# Patient Record
Sex: Female | Born: 1960 | Race: Black or African American | Hispanic: No | Marital: Married | Smoking: Never smoker
Health system: Southern US, Community
[De-identification: ages and names within clinical notes are randomized; demographics above are authoritative.]

## PROBLEM LIST (undated history)

## (undated) DIAGNOSIS — E049 Nontoxic goiter, unspecified: Secondary | ICD-10-CM

## (undated) DIAGNOSIS — E0590 Thyrotoxicosis, unspecified without thyrotoxic crisis or storm: Secondary | ICD-10-CM

## (undated) DIAGNOSIS — K2100 Gastro-esophageal reflux disease with esophagitis, without bleeding: Secondary | ICD-10-CM

## (undated) DIAGNOSIS — K589 Irritable bowel syndrome without diarrhea: Secondary | ICD-10-CM

## (undated) DIAGNOSIS — K599 Functional intestinal disorder, unspecified: Secondary | ICD-10-CM

## (undated) DIAGNOSIS — E041 Nontoxic single thyroid nodule: Secondary | ICD-10-CM

## (undated) DIAGNOSIS — Z8619 Personal history of other infectious and parasitic diseases: Secondary | ICD-10-CM

## (undated) DIAGNOSIS — L819 Disorder of pigmentation, unspecified: Secondary | ICD-10-CM

## (undated) DIAGNOSIS — Z803 Family history of malignant neoplasm of breast: Secondary | ICD-10-CM

## (undated) DIAGNOSIS — D573 Sickle-cell trait: Secondary | ICD-10-CM

## (undated) DIAGNOSIS — E785 Hyperlipidemia, unspecified: Secondary | ICD-10-CM

## (undated) DIAGNOSIS — Z860100 Personal history of colon polyps, unspecified: Secondary | ICD-10-CM

## (undated) DIAGNOSIS — I10 Essential (primary) hypertension: Secondary | ICD-10-CM

## (undated) HISTORY — DX: Personal history of colonic polyps: Z86.010

## (undated) HISTORY — DX: Gastro-esophageal reflux disease with esophagitis, without bleeding: K21.00

## (undated) HISTORY — DX: Functional intestinal disorder, unspecified: K59.9

## (undated) HISTORY — DX: Gastro-esophageal reflux disease with esophagitis: K21.0

## (undated) HISTORY — DX: Irritable bowel syndrome, unspecified: K58.9

## (undated) HISTORY — DX: Essential (primary) hypertension: I10

## (undated) HISTORY — DX: Family history of malignant neoplasm of breast: Z80.3

## (undated) HISTORY — DX: Personal history of other infectious and parasitic diseases: Z86.19

## (undated) HISTORY — DX: Personal history of colon polyps, unspecified: Z86.0100

## (undated) HISTORY — DX: Nontoxic goiter, unspecified: E04.9

## (undated) HISTORY — DX: Sickle-cell trait: D57.3

## (undated) HISTORY — DX: Hyperlipidemia, unspecified: E78.5

## (undated) HISTORY — DX: Thyrotoxicosis, unspecified without thyrotoxic crisis or storm: E05.90

## (undated) HISTORY — DX: Disorder of pigmentation, unspecified: L81.9

## (undated) HISTORY — DX: Nontoxic single thyroid nodule: E04.1

## (undated) HISTORY — PX: ESOPHAGOGASTRODUODENOSCOPY: SHX1529

---

## 2002-03-26 DIAGNOSIS — L819 Disorder of pigmentation, unspecified: Secondary | ICD-10-CM

## 2002-03-26 HISTORY — DX: Disorder of pigmentation, unspecified: L81.9

## 2004-10-11 ENCOUNTER — Ambulatory Visit: Payer: Self-pay | Admitting: Family Medicine

## 2005-09-04 ENCOUNTER — Emergency Department (HOSPITAL_COMMUNITY): Admission: EM | Admit: 2005-09-04 | Discharge: 2005-09-04 | Payer: Self-pay | Admitting: Family Medicine

## 2005-10-11 ENCOUNTER — Ambulatory Visit: Payer: Self-pay | Admitting: Family Medicine

## 2006-11-13 ENCOUNTER — Emergency Department (HOSPITAL_COMMUNITY): Admission: EM | Admit: 2006-11-13 | Discharge: 2006-11-13 | Payer: Self-pay | Admitting: Family Medicine

## 2006-11-21 ENCOUNTER — Ambulatory Visit: Payer: Self-pay | Admitting: Family Medicine

## 2007-04-03 ENCOUNTER — Ambulatory Visit: Payer: Self-pay | Admitting: Gastroenterology

## 2007-12-30 ENCOUNTER — Ambulatory Visit: Payer: Self-pay | Admitting: Family Medicine

## 2007-12-31 ENCOUNTER — Emergency Department (HOSPITAL_COMMUNITY): Admission: EM | Admit: 2007-12-31 | Discharge: 2008-01-01 | Payer: Self-pay | Admitting: Emergency Medicine

## 2008-01-28 ENCOUNTER — Emergency Department (HOSPITAL_COMMUNITY): Admission: EM | Admit: 2008-01-28 | Discharge: 2008-01-28 | Payer: Self-pay | Admitting: Family Medicine

## 2008-06-19 ENCOUNTER — Emergency Department (HOSPITAL_COMMUNITY): Admission: EM | Admit: 2008-06-19 | Discharge: 2008-06-19 | Payer: Self-pay | Admitting: Family Medicine

## 2008-11-17 ENCOUNTER — Emergency Department: Payer: Self-pay | Admitting: Unknown Physician Specialty

## 2008-11-24 HISTORY — PX: OTHER SURGICAL HISTORY: SHX169

## 2008-12-01 ENCOUNTER — Ambulatory Visit: Payer: Self-pay | Admitting: Physician Assistant

## 2008-12-30 ENCOUNTER — Ambulatory Visit: Payer: Self-pay | Admitting: Family Medicine

## 2010-01-10 ENCOUNTER — Ambulatory Visit: Payer: Self-pay | Admitting: Family Medicine

## 2010-12-29 ENCOUNTER — Ambulatory Visit: Payer: Self-pay | Admitting: Gastroenterology

## 2011-01-31 ENCOUNTER — Ambulatory Visit: Payer: Self-pay | Admitting: Family Medicine

## 2011-05-16 ENCOUNTER — Ambulatory Visit (INDEPENDENT_AMBULATORY_CARE_PROVIDER_SITE_OTHER): Payer: Federal, State, Local not specified - PPO | Admitting: Family Medicine

## 2011-05-16 ENCOUNTER — Encounter: Payer: Self-pay | Admitting: Family Medicine

## 2011-05-16 VITALS — BP 158/100 | HR 84 | Temp 98.8°F | Ht 67.0 in | Wt 162.2 lb

## 2011-05-16 DIAGNOSIS — R198 Other specified symptoms and signs involving the digestive system and abdomen: Secondary | ICD-10-CM

## 2011-05-16 DIAGNOSIS — H6190 Disorder of external ear, unspecified, unspecified ear: Secondary | ICD-10-CM | POA: Insufficient documentation

## 2011-05-16 DIAGNOSIS — K635 Polyp of colon: Secondary | ICD-10-CM

## 2011-05-16 DIAGNOSIS — K599 Functional intestinal disorder, unspecified: Secondary | ICD-10-CM

## 2011-05-16 DIAGNOSIS — K5989 Other specified functional intestinal disorders: Secondary | ICD-10-CM

## 2011-05-16 DIAGNOSIS — E049 Nontoxic goiter, unspecified: Secondary | ICD-10-CM

## 2011-05-16 DIAGNOSIS — D126 Benign neoplasm of colon, unspecified: Secondary | ICD-10-CM

## 2011-05-16 DIAGNOSIS — I10 Essential (primary) hypertension: Secondary | ICD-10-CM

## 2011-05-16 DIAGNOSIS — Z86010 Personal history of colonic polyps: Secondary | ICD-10-CM | POA: Insufficient documentation

## 2011-05-16 LAB — POC HEMOCCULT BLD/STL (OFFICE/1-CARD/DIAGNOSTIC): Fecal Occult Blood, POC: NEGATIVE

## 2011-05-16 NOTE — Assessment & Plan Note (Addendum)
Associated with fleeting sharp pains. Unsure etiology.  Exam normal today.  Hemoccult negative. ?proctalgia fugax.  Consider warm water vs topical nitro. See below.

## 2011-05-16 NOTE — Assessment & Plan Note (Signed)
Will request records from Dr. Bluford Kaufmann and review records from prior PCP, likely due for rpt colonoscopy as last 2008 and +fmhx (told rpt q51yrs).

## 2011-05-16 NOTE — Assessment & Plan Note (Signed)
Elevated today but did not take meds yet, new doctor, stressful night at work. No changes today. rtc 1 mo for f/u. Asked to keep track of bp at store and notify me sooner if consistently >140/90

## 2011-05-16 NOTE — Assessment & Plan Note (Addendum)
Per pt has had this evaluated in past . Will start by requesting records for workup so far. Discussed concern with regular laxative/enema use.  Could be worsening rectal pressure sensation. Reassess next visit.

## 2011-05-16 NOTE — Patient Instructions (Addendum)
Look into [facility] - they have a lot of resources and can be very helpful for someone who has been through what you have been through. Keep eye on blood pressure at home.  If consistently >140/90, let us know Return in 1 month for follow up and recheck blood pressure. I will request records from Dr. Thana Ates and Dr. Bluford Kaufmann and dermatology - call us with name of dermatology office you had ear biopsy at. Good to meet you today! Call us with questions

## 2011-05-16 NOTE — Assessment & Plan Note (Signed)
Per pt has had biopsy by derm years ago, told benign. Unchanged for last 7 years. Pt will call us with name of dermatologist office and will request derm records to ensure benign lesion.

## 2011-05-16 NOTE — Progress Notes (Signed)
Subjective:    Patient ID: Kendra Jackson, female    DOB: 06-03-1960, 51 y.o.   MRN: [id number]  HPI CC: new pt establish, bottom hurting  Prior saw Dr. Thana Ates, trouble getting in (2 mo).  Rectal pressure - longstanding problem.  Feeling pulling sensation and constant pressure in bottom.  No blood in stool.  2 mo ago had sharp pain lasted <1 min on bottom.  Has not had episode since.  Born with locked bowels, as child evaluated at [facility].  Recommended surgery at age 71, never had.  Stays constipated.  Uses enema or laxative regularly to have bowel movement.  Has been using ex lax.  Has tried miralax, metamucil in past, no success.  Has had 2 colonoscopies in past with polyps, last 2008.  H/o endoscopy 3 mo ago by Dr. Bluford Kaufmann for choking sensation told normal.  Maternal grandmother passed from colon cancer.  H/o abnormal thyroid panels, never needed meds though.  Has seen endo.  LMP 03/2010.  Menopausal.  HTN - bp elevated today, did not take meds yet.  Works third shift and didn't get good night's rest.  Preventative: Last CPE was 10/2010. Well woman 10/2010. Mammogram - 12/2010 normal.  Medications and allergies reviewed and updated in chart.  Past histories reviewed and updated if relevant as below. Patient Active Problem List  Diagnoses  . HTN (hypertension)  . Colon polyp  . Enlarged thyroid   Past Medical History  Diagnosis Date  . HTN (hypertension)   . Colon polyp     last colonoscopy 2008, rpt due 3 yrs  . Enlarged thyroid     labs come back abnormal-but has never needed treatment  . Sickle cell trait    No past surgical history on file. History  Substance Use Topics  . Smoking status: Never Smoker   . Smokeless tobacco: Never Used  . Alcohol Use: No   Family History  Problem Relation Age of Onset  . Heart disease Mother   . Heart disease Father     pig valve  . Coronary artery disease Father   . Cancer Father     prostate  . Cancer Sister     breast   . Diabetes Sister   . Mental illness Sister     schizophrenia  . Cancer Brother     appendix tumor, prostate  . Diabetes Brother   . Mental illness Brother     bipolar  . Cancer Maternal Aunt     bone  . Cancer Maternal Uncle     bladder  . Cancer Maternal Grandmother     colon  . Stroke Maternal Grandfather   . Coronary artery disease Paternal Grandfather   . Cancer Cousin     ovarian, bone   Allergies  Allergen Reactions  . Neosporin (Triple Antibiotic) Rash   No current outpatient prescriptions on file prior to visit.    Review of Systems  Constitutional: Negative for fever, chills, activity change, appetite change, fatigue and unexpected weight change.  HENT: Negative for hearing loss and neck pain.   Eyes: Negative for visual disturbance.  Respiratory: Negative for cough, chest tightness, shortness of breath and wheezing.   Cardiovascular: Negative for chest pain, palpitations and leg swelling.  Gastrointestinal: Positive for constipation. Negative for nausea, vomiting, abdominal pain, diarrhea, blood in stool and abdominal distention.  Genitourinary: Negative for hematuria and difficulty urinating.  Musculoskeletal: Negative for myalgias and arthralgias.  Skin: Negative for rash.  Neurological: Negative for dizziness,  seizures, syncope and headaches.  Hematological: Does not bruise/bleed easily.  Psychiatric/Behavioral: Negative for dysphoric mood. The patient is not nervous/anxious.        Objective:   Physical Exam  Nursing note and vitals reviewed. Constitutional: She is oriented to person, place, and time. She appears well-developed and well-nourished. No distress.  HENT:  Head: Normocephalic and atraumatic.  Right Ear: External ear normal.  Left Ear: External ear normal.  Nose: Nose normal.  Mouth/Throat: Oropharynx is clear and moist. No oropharyngeal exudate.  Eyes: Conjunctivae and EOM are normal. Pupils are equal, round, and reactive to light. No  scleral icterus.  Neck: Normal range of motion. Neck supple. No thyromegaly present.  Cardiovascular: Normal rate, regular rhythm, normal heart sounds and intact distal pulses.   No murmur heard. Pulses:      Radial pulses are 2+ on the right side, and 2+ on the left side.  Pulmonary/Chest: Effort normal and breath sounds normal. No respiratory distress. She has no wheezes. She has no rales.  Abdominal: Soft. Bowel sounds are normal. She exhibits no distension and no mass. There is no tenderness. There is no rebound and no guarding.  Genitourinary: Rectum normal. Rectal exam shows no external hemorrhoid, no internal hemorrhoid, no fissure, no mass, no tenderness and anal tone normal. Guaiac negative stool.  Musculoskeletal: Normal range of motion. She exhibits no edema.  Lymphadenopathy:    She has no cervical adenopathy.  Neurological: She is alert and oriented to person, place, and time.       CN grossly intact, station and gait intact  Skin: Skin is warm and dry. No rash noted.       Dark discoloration left pinna - per pt stable and has had eval by dermatology  Psychiatric: She has a normal mood and affect. Her behavior is normal. Judgment and thought content normal.       Assessment & Plan:

## 2011-05-27 ENCOUNTER — Encounter: Payer: Self-pay | Admitting: Family Medicine

## 2011-05-29 ENCOUNTER — Encounter: Payer: Self-pay | Admitting: Family Medicine

## 2011-05-30 ENCOUNTER — Institutional Professional Consult (permissible substitution): Payer: Federal, State, Local not specified - PPO | Admitting: Family Medicine

## 2011-06-15 ENCOUNTER — Ambulatory Visit (INDEPENDENT_AMBULATORY_CARE_PROVIDER_SITE_OTHER): Payer: Federal, State, Local not specified - PPO | Admitting: Family Medicine

## 2011-06-15 ENCOUNTER — Encounter: Payer: Self-pay | Admitting: Family Medicine

## 2011-06-15 VITALS — BP 136/94 | HR 68 | Temp 98.8°F | Ht 67.0 in | Wt 159.8 lb

## 2011-06-15 DIAGNOSIS — D126 Benign neoplasm of colon, unspecified: Secondary | ICD-10-CM

## 2011-06-15 DIAGNOSIS — Z1211 Encounter for screening for malignant neoplasm of colon: Secondary | ICD-10-CM

## 2011-06-15 DIAGNOSIS — H6190 Disorder of external ear, unspecified, unspecified ear: Secondary | ICD-10-CM

## 2011-06-15 DIAGNOSIS — I10 Essential (primary) hypertension: Secondary | ICD-10-CM

## 2011-06-15 DIAGNOSIS — K635 Polyp of colon: Secondary | ICD-10-CM

## 2011-06-15 DIAGNOSIS — E049 Nontoxic goiter, unspecified: Secondary | ICD-10-CM

## 2011-06-15 MED ORDER — OMEPRAZOLE 40 MG PO CPDR: 40.0000 mg | DELAYED_RELEASE_CAPSULE | Freq: Every day | ORAL | Status: DC

## 2011-06-15 NOTE — Progress Notes (Signed)
Subjective:    Patient ID: Kendra Jackson, female    DOB: Feb 16, 1961, 51 y.o.   MRN: [id number]  HPI CC: 1 mo f/u HTN  Pt states last CPE was 11/2010.  HTN - No HA, vision changes, CP/tightness, SOB, leg swelling.  Compliant with atenolol bid.  States at home bp well controlled, better today than at last visit. BP Readings from Last 3 Encounters:  06/15/11 136/94  05/16/11 158/100    L ear antihelix skin lesion - bx 2004 by Dr. Jarold Motto benign SK but per pt growing again.  Will refer back to derm for re check.  Colonoscopy 03/2007 - 1 hyperplastic polyp (Dr. Diona Fanti).   Thyroid - nodule found R thyroid at screening for carotid stenosis at work.  Last TSH 0.91 (11/2010).  Pt states has had evals by endo with abnl thyroid functions but never rec pursue treatment.  Medications and allergies reviewed and updated in chart.  Past histories reviewed and updated if relevant as below. Patient Active Problem List  Diagnoses  . HTN (hypertension)  . Colon polyp  . Enlarged thyroid  . Earlobe lesion  . Rectal pressure  . Abnormal large bowel motility   Past Medical History  Diagnosis Date  . HTN (hypertension)   . History of colon polyps     hyperplastic polyp 2008, rpt 2013  . Enlarged thyroid     labs come back abnormal-but has never needed treatment  . Sickle cell trait   . 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 Abnormal pigmentation of skin 2004    L ear antihelix - bx consistent with SK Jarold Motto)  . History of herpes genitalis   . HLD (hyperlipidemia)   . IBS (irritable bowel syndrome)   . Family history of breast cancer in first degree relative   . Reflux esophagitis     PPI  . Anxiety    Past Surgical History  Procedure Date  . Tvs 11/2008    multiple uterine fibroids   History  Substance Use Topics  . Smoking status: Never Smoker   . Smokeless tobacco: Never Used  . Alcohol Use: No   Family History  Problem Relation  Age of Onset  . Heart disease Mother   . Heart disease Father     pig valve  . Coronary artery disease Father   . Cancer Father     prostate  . Cancer Sister     breast  . Diabetes Sister   . Mental illness Sister     schizophrenia  . Cancer Brother     appendix tumor, prostate  . Diabetes Brother   . Mental illness Brother     bipolar  . Cancer Maternal Aunt     bone  . Cancer Maternal Uncle     bladder  . Cancer Maternal Grandmother     colon  . Stroke Maternal Grandfather   . Coronary artery disease Paternal Grandfather   . Cancer Cousin     ovarian, bone   Allergies  Allergen Reactions  . Neosporin (Triple Antibiotic) Rash   Current Outpatient Prescriptions on File Prior to Visit  Medication Sig Dispense Refill  . aspirin 81 MG tablet Take 81 mg by mouth daily.      Marland Kitchen atenolol (TENORMIN) 50 MG tablet Take 50 mg by mouth 2 (two) times daily.      Marland Kitchen LORazepam (ATIVAN) 1 MG tablet Take 1 mg by mouth at bedtime as  needed.      . Multiple Vitamin (MULTIVITAMIN) tablet Take 1 tablet by mouth daily.      Marland Kitchen omeprazole (PRILOSEC) 40 MG capsule Take 40 mg by mouth daily.         Review of Systems Per HPI    Objective:   Physical Exam  Nursing note and vitals reviewed. Constitutional: She appears well-developed and well-nourished. No distress.  HENT:  Head: Normocephalic and atraumatic.  Mouth/Throat: Oropharynx is clear and moist. No oropharyngeal exudate.  Eyes: Conjunctivae and EOM are normal. Pupils are equal, round, and reactive to light. No scleral icterus.  Neck: Normal range of motion. Neck supple. No thyromegaly (no nodule appreciated) present.  Cardiovascular: Normal rate, regular rhythm, normal heart sounds and intact distal pulses.   No murmur heard. Pulmonary/Chest: Effort normal and breath sounds normal. No respiratory distress. She has no wheezes. She has no rales.  Abdominal: Soft. Bowel sounds are normal. She exhibits no distension. There is no  tenderness. There is no rebound and no guarding.  Musculoskeletal: She exhibits no edema.  Lymphadenopathy:    She has no cervical adenopathy.  Skin: Skin is warm and dry. No rash noted.  Psychiatric: She has a normal mood and affect.       Assessment & Plan:

## 2011-06-15 NOTE — Patient Instructions (Signed)
Pass by Kendra Jackson's office to schedule referral to GI for colonoscopy, derm, and thyroid ultrasound. No changes in blood pressure medicine. Return as needed.

## 2011-06-17 NOTE — Assessment & Plan Note (Signed)
will refer back to GI for rpt colonoscopy as pt states told to rpt q3 yrs given fmhx (maternal grandmother with colon cancer).   Discussed may be able to space out to Q16yrs.

## 2011-06-17 NOTE — Assessment & Plan Note (Signed)
Improved control today.  continue atenolol. Pt states not consistently elevated at home.

## 2011-06-17 NOTE — Assessment & Plan Note (Signed)
Lab Results  Component Value Date   TSH 0.91 12/06/2010  reviewed work screen - <1cm R nodule. Normal TSH last check.   Given abnormality found on work screening, will obtain thyroid ultrasound to further eval nodule.

## 2011-06-17 NOTE — Assessment & Plan Note (Signed)
Per pt growing. Will refer back to derm for recheck.

## 2011-06-25 DIAGNOSIS — E041 Nontoxic single thyroid nodule: Secondary | ICD-10-CM

## 2011-06-25 HISTORY — DX: Nontoxic single thyroid nodule: E04.1

## 2011-06-29 ENCOUNTER — Ambulatory Visit: Payer: Self-pay | Admitting: Family Medicine

## 2011-07-02 ENCOUNTER — Other Ambulatory Visit: Payer: Self-pay | Admitting: Family Medicine

## 2011-07-02 ENCOUNTER — Encounter: Payer: Self-pay | Admitting: Family Medicine

## 2011-07-02 DIAGNOSIS — E041 Nontoxic single thyroid nodule: Secondary | ICD-10-CM

## 2011-07-02 DIAGNOSIS — E042 Nontoxic multinodular goiter: Secondary | ICD-10-CM | POA: Insufficient documentation

## 2011-07-02 DIAGNOSIS — E049 Nontoxic goiter, unspecified: Secondary | ICD-10-CM

## 2011-07-10 ENCOUNTER — Other Ambulatory Visit (HOSPITAL_COMMUNITY)
Admission: RE | Admit: 2011-07-10 | Discharge: 2011-07-10 | Disposition: A | Discharge status: Home / Self Care | Payer: Federal, State, Local not specified - PPO | Source: Ambulatory Visit | Attending: Diagnostic Radiology | Admitting: Diagnostic Radiology

## 2011-07-10 ENCOUNTER — Ambulatory Visit
Admission: RE | Admit: 2011-07-10 | Discharge: 2011-07-10 | Disposition: A | Discharge status: Home / Self Care | Payer: Federal, State, Local not specified - PPO | Source: Ambulatory Visit | Attending: Family Medicine | Admitting: Family Medicine

## 2011-07-10 DIAGNOSIS — E049 Nontoxic goiter, unspecified: Secondary | ICD-10-CM | POA: Insufficient documentation

## 2011-07-10 DIAGNOSIS — E041 Nontoxic single thyroid nodule: Secondary | ICD-10-CM

## 2011-07-11 ENCOUNTER — Encounter: Payer: Self-pay | Admitting: Family Medicine

## 2011-07-12 ENCOUNTER — Encounter: Payer: Self-pay | Admitting: *Deleted

## 2011-07-13 ENCOUNTER — Ambulatory Visit: Payer: Federal, State, Local not specified - PPO | Admitting: Family Medicine

## 2011-07-17 ENCOUNTER — Encounter: Payer: Self-pay | Admitting: Family Medicine

## 2011-07-17 ENCOUNTER — Ambulatory Visit (INDEPENDENT_AMBULATORY_CARE_PROVIDER_SITE_OTHER): Payer: Federal, State, Local not specified - PPO | Admitting: Family Medicine

## 2011-07-17 VITALS — BP 148/98 | HR 72 | Temp 98.4°F | Wt 164.5 lb

## 2011-07-17 DIAGNOSIS — I10 Essential (primary) hypertension: Secondary | ICD-10-CM

## 2011-07-17 DIAGNOSIS — E041 Nontoxic single thyroid nodule: Secondary | ICD-10-CM

## 2011-07-17 MED ORDER — HYDROCHLOROTHIAZIDE 12.5 MG PO CAPS: 12.5000 mg | ORAL_CAPSULE | Freq: Every day | ORAL | Status: DC

## 2011-07-17 NOTE — Progress Notes (Signed)
  Subjective:    Patient ID: Kendra Jackson, female    DOB: 1960-10-08, 51 y.o.   MRN: [id number]  HPI CC: f/u htn and thyroid nodule.  HTN - takes atenolol 50mg  bid.  No cuff at home.  No vision changes, CP/tightness, SOB, leg swelling.  At home bp runs better.   BP Readings from Last 3 Encounters:  07/17/11 148/98  06/15/11 136/94  05/16/11 158/100    Thyroid nodule - reviewed biopsy results with pt - benign follicular nodule.  rec routine surveillance.  Also found microcalcifications in left lobe, will rpt [place] in 6 mo to follow this.  ?dystrophic calcification  Past Medical History  Diagnosis Date  . HTN (hypertension)   . History of colon polyps     hyperplastic polyp 2008, rpt 2013  . Enlarged thyroid     h/o abnl labs but never treated  . Sickle cell trait   . 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 Abnormal pigmentation of skin 2004    L ear antihelix - bx consistent with SK Jarold Motto)  . History of herpes genitalis   . HLD (hyperlipidemia)   . IBS (irritable bowel syndrome)   . Family history of breast cancer in first degree relative   . Reflux esophagitis     PPI  . Anxiety   . Thyroid nodule 06/2011    R nodule, FNA biopsy = benign follicular    Review of Systems Per HPI    Objective:   Physical Exam  Nursing note and vitals reviewed. Constitutional: She appears well-developed and well-nourished. No distress.  HENT:  Head: Normocephalic and atraumatic.  Mouth/Throat: Oropharynx is clear and moist. No oropharyngeal exudate.  Eyes: Conjunctivae and EOM are normal. Pupils are equal, round, and reactive to light. No scleral icterus.  Neck: Normal range of motion. Neck supple. Carotid bruit is not present. No thyromegaly (R nodule palpated) present.  Cardiovascular: Normal rate, regular rhythm and intact distal pulses.   Murmur (faint early diast murm more pronounced with inspiration) heard. Pulmonary/Chest: Effort normal  and breath sounds normal. No respiratory distress. She has no wheezes. She has no rales.  Musculoskeletal: She exhibits no edema.  Lymphadenopathy:    She has no cervical adenopathy.  Skin: Skin is warm and dry. No rash noted.       Assessment & Plan:

## 2011-07-17 NOTE — Assessment & Plan Note (Signed)
Thyroid nodule - reviewed biopsy results with pt - benign follicular nodule.  I have recommended routine surveillance.   Also found microcalcifications in left lobe per prior US - will rpt [place] in 6 mo to follow this.  ?dystrophic calcification

## 2011-07-17 NOTE — Assessment & Plan Note (Signed)
Chronic.  Not controlled. Add HCTZ at 12.5mg  daily. rtc 1-2 mo for f/u.

## 2011-07-17 NOTE — Patient Instructions (Signed)
Start hydrochlorothiazide 12.5mg  daily. Return in 1 1/2 to 2 months Good to see you today,call us with questions.

## 2011-07-25 HISTORY — PX: COLONOSCOPY: SHX174

## 2011-08-06 ENCOUNTER — Encounter: Payer: Self-pay | Admitting: Family Medicine

## 2011-08-08 ENCOUNTER — Ambulatory Visit: Payer: Self-pay | Admitting: Gastroenterology

## 2011-08-16 ENCOUNTER — Encounter: Payer: Self-pay | Admitting: Family Medicine

## 2011-08-27 ENCOUNTER — Encounter: Payer: Self-pay | Admitting: Family Medicine

## 2011-08-27 ENCOUNTER — Ambulatory Visit (INDEPENDENT_AMBULATORY_CARE_PROVIDER_SITE_OTHER): Payer: Federal, State, Local not specified - PPO | Admitting: Family Medicine

## 2011-08-27 VITALS — BP 120/78 | HR 80 | Temp 98.5°F | Wt 161.8 lb

## 2011-08-27 DIAGNOSIS — J40 Bronchitis, not specified as acute or chronic: Secondary | ICD-10-CM | POA: Insufficient documentation

## 2011-08-27 MED ORDER — AZITHROMYCIN 250 MG PO TABS: ORAL_TABLET | ORAL | Status: AC

## 2011-08-27 NOTE — Patient Instructions (Signed)
Sounds like you are developing bronchitis. Cough itself can last for a few weeks before it goes away. Use medication as prescribed:  Zpack. Push fluids and plenty of rest. Start mucinex (or guaifenesin) with plenty of fluid to mobilize mucous. Delsym for cough at night. Please return if you are not improving as expected, or if you have high fevers (>101.5) or difficulty swallowing or worsening productive cough. Call clinic with questions.  Good to see you today.

## 2011-08-27 NOTE — Assessment & Plan Note (Signed)
Given duration and deterioration after initial improvement, will cover bronchitis with zpack. Update if not improving as expected.

## 2011-08-27 NOTE — Progress Notes (Signed)
  Subjective:    Patient ID: Kendra Jackson, female    DOB: 01-30-1961, 51 y.o.   MRN: [id number]  HPI CC: "i feel like i'm getting bronchitis"  10 d h/o sxs - started with cough productive of yellow/red mucous.  Felt better with zycam, then started having sore throat.  Cough has come back and continued productive but now clear.  Some chills and sweating at work.  Some tooth pain.  Feeling some nauseated.  Some coughing fits.  Tried zycam.  Delsym has worked in past.  No fevers/chills, ST, abd pain, ear pain.  No significant headache.  No smokers at home.  No sick contacts at home.  No h/o asthma, COPD  Review of Systems Per HPI    Objective:   Physical Exam  Nursing note and vitals reviewed. Constitutional: She appears well-developed and well-nourished. No distress.  HENT:  Head: Normocephalic and atraumatic.  Right Ear: Hearing, external ear and ear canal normal.  Left Ear: Hearing, external ear and ear canal normal.  Nose: Mucosal edema present. No rhinorrhea. Right sinus exhibits no maxillary sinus tenderness and no frontal sinus tenderness. Left sinus exhibits no maxillary sinus tenderness and no frontal sinus tenderness.  Mouth/Throat: Uvula is midline, oropharynx is clear and moist and mucous membranes are normal. No oropharyngeal exudate, posterior oropharyngeal edema, posterior oropharyngeal erythema or tonsillar abscesses.       Some fluid behind TMs  Eyes: Conjunctivae and EOM are normal. Pupils are equal, round, and reactive to light. No scleral icterus.  Neck: Normal range of motion. Neck supple.  Cardiovascular: Normal rate, regular rhythm, normal heart sounds and intact distal pulses.   No murmur heard. Pulmonary/Chest: Effort normal and breath sounds normal. No respiratory distress. She has no wheezes. She has no rales.  Musculoskeletal: She exhibits no edema.  Lymphadenopathy:    She has no cervical adenopathy.  Skin: Skin is warm and dry. No rash noted.         Assessment & Plan:

## 2011-09-10 ENCOUNTER — Other Ambulatory Visit: Payer: Self-pay | Admitting: *Deleted

## 2011-09-10 MED ORDER — ATENOLOL 50 MG PO TABS: 50.0000 mg | ORAL_TABLET | Freq: Two times a day (BID) | ORAL | Status: DC

## 2011-09-14 ENCOUNTER — Ambulatory Visit: Payer: Federal, State, Local not specified - PPO | Admitting: Family Medicine

## 2011-10-05 ENCOUNTER — Other Ambulatory Visit: Payer: Self-pay | Admitting: Family Medicine

## 2011-10-05 NOTE — Telephone Encounter (Signed)
plz phone in. 

## 2011-10-05 NOTE — Telephone Encounter (Signed)
OK to refill

## 2011-10-05 NOTE — Telephone Encounter (Signed)
Rx called in as directed.   

## 2011-11-07 ENCOUNTER — Encounter: Payer: Self-pay | Admitting: Family Medicine

## 2011-11-07 ENCOUNTER — Ambulatory Visit (INDEPENDENT_AMBULATORY_CARE_PROVIDER_SITE_OTHER): Payer: Federal, State, Local not specified - PPO | Admitting: Family Medicine

## 2011-11-07 VITALS — BP 118/80 | HR 64 | Temp 98.0°F | Wt 167.8 lb

## 2011-11-07 DIAGNOSIS — M79672 Pain in left foot: Secondary | ICD-10-CM

## 2011-11-07 DIAGNOSIS — M79609 Pain in unspecified limb: Secondary | ICD-10-CM

## 2011-11-07 MED ORDER — NAPROXEN 500 MG PO TABS: ORAL_TABLET | ORAL | Status: DC

## 2011-11-07 NOTE — Progress Notes (Addendum)
  Subjective:    Patient ID: Kendra Jackson, female    DOB: 1961/03/16, 51 y.o.   MRN: [id number]  HPI CC: L foot pain  For last 1.5 wks, pain left dorsal midfoot, some dorsal as well, intermittent.  Sometimes feels like squeezing pain.  One morning this week, severe tender pain, didn't let her walk, lasted a few seconds then resolved.  Pain stays at foot.  Some tingling of toes.  No falls or injury to feet.  Wonders if gout or plantar fasciitis  Parents with h/o gout.  Pt has been eating more chicken liver recently.  Wears tennis shoes to work.  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  Does wear heels on weekends.  Review of Systems Per HPI    Objective:   Physical Exam  Nursing note and vitals reviewed. Constitutional: She appears well-developed and well-nourished. No distress.  Musculoskeletal:       R foot - WNL L foot - Tender to palpation at 3rd MT dorsal midfoot. No pain with axial loading of toes. No pain at soles of feet. No pain with palpation ankle, FROM. Normal arches. 2+ DP bilaterally       Assessment & Plan:

## 2011-11-07 NOTE — Assessment & Plan Note (Signed)
Doubt plantar fasciitis or gout. Anticipate toe extensor tendonitis, see pt instructions for plan. If not improving, consider left foot xray to r/o stress fracture.

## 2011-11-07 NOTE — Patient Instructions (Signed)
I think you have tendon inflammation on top of left foot. Ice foot, elevate foot, no heels for 2 weeks. Use anti inflammatory regularly for 5 days then as needed, take with food so as not to upset stomach. If not improving within 1-2 wks or worsening, let me know, could consider xrays. Good to see youtoday, call us with quesitons.

## 2011-11-28 ENCOUNTER — Telehealth: Payer: Self-pay | Admitting: Family Medicine

## 2011-11-28 NOTE — Telephone Encounter (Signed)
Caller: Rossi/Patient; Patient Name: Kendra Jackson; PCP: Eustaquio Boyden [facility]); Best Callback Phone Number: [phone].  Call regarding Right Flank Pain with Nausea, onset 9-3. Afebrile.  Patient has appointment on 9-5 prior to call. Afebrile.  All emergent symptoms ruled out per Flank pain Protocol, see in 24 hours due to persistent flank pain not previously evaluated.  Discussed increasing water intake, home care advice given.  Patient will come to scheduled appointment on 9-5.

## 2011-11-29 ENCOUNTER — Encounter: Payer: Self-pay | Admitting: Family Medicine

## 2011-11-29 ENCOUNTER — Ambulatory Visit (INDEPENDENT_AMBULATORY_CARE_PROVIDER_SITE_OTHER): Payer: Federal, State, Local not specified - PPO | Admitting: Family Medicine

## 2011-11-29 VITALS — BP 126/82 | HR 78 | Temp 98.1°F | Wt 164.2 lb

## 2011-11-29 DIAGNOSIS — M545 Low back pain, unspecified: Secondary | ICD-10-CM

## 2011-11-29 DIAGNOSIS — I10 Essential (primary) hypertension: Secondary | ICD-10-CM

## 2011-11-29 LAB — COMPREHENSIVE METABOLIC PANEL
BUN: 14 mg/dL (ref 6–23)
CO2: 31 mEq/L (ref 19–32)
Calcium: 10 mg/dL (ref 8.4–10.5)
Chloride: 101 mEq/L (ref 96–112)
Creatinine, Ser: 0.8 mg/dL (ref 0.4–1.2)
GFR: 96.47 mL/min (ref >60.00)
Glucose, Bld: 93 mg/dL (ref 70–99)
Total Bilirubin: 0.7 mg/dL (ref 0.3–1.2)

## 2011-11-29 LAB — POCT URINALYSIS DIPSTICK
Glucose, UA: NEGATIVE
Leukocytes, UA: NEGATIVE
Nitrite, UA: NEGATIVE
Protein, UA: NEGATIVE
Spec Grav, UA: 1.01
Urobilinogen, UA: 0.2

## 2011-11-29 LAB — LIPID PANEL
Cholesterol: 255 mg/dL — ABNORMAL HIGH (ref 0–200)
Triglycerides: 58 mg/dL (ref 0.0–149.0)

## 2011-11-29 LAB — TSH: TSH: 0.48 u[IU]/mL (ref 0.35–5.50)

## 2011-11-29 MED ORDER — CYCLOBENZAPRINE HCL 10 MG PO TABS: 10.0000 mg | ORAL_TABLET | Freq: Two times a day (BID) | ORAL | Status: AC | PRN
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
## 2011-11-29 NOTE — Patient Instructions (Signed)
Blood work today for upcoming physical. I think this is musculoskeletal - likely pulled muscle of lower back. Treat with ice/heat (whichever soothes better), over the counter aleve, and may use flexeril for muscle spasm (watch out as it can make you sleepy - may try 1/2 pll at a time). Let us know if not improving as expected.

## 2011-11-29 NOTE — Assessment & Plan Note (Signed)
MSK pain from strain. Treat with aleve, flexeril, ice/heat. Update if not improving. Pt agrees.

## 2011-12-10 ENCOUNTER — Other Ambulatory Visit: Payer: Federal, State, Local not specified - PPO

## 2011-12-17 ENCOUNTER — Other Ambulatory Visit (HOSPITAL_COMMUNITY)
Admission: RE | Admit: 2011-12-17 | Discharge: 2011-12-17 | Disposition: A | Discharge status: Home / Self Care | Payer: Federal, State, Local not specified - PPO | Source: Ambulatory Visit | Attending: Family Medicine | Admitting: Family Medicine

## 2011-12-17 ENCOUNTER — Encounter: Payer: Self-pay | Admitting: Family Medicine

## 2011-12-17 ENCOUNTER — Ambulatory Visit (INDEPENDENT_AMBULATORY_CARE_PROVIDER_SITE_OTHER): Payer: Federal, State, Local not specified - PPO | Admitting: Family Medicine

## 2011-12-17 VITALS — BP 120/78 | HR 72 | Temp 98.2°F | Ht 66.5 in | Wt 165.5 lb

## 2011-12-17 DIAGNOSIS — Z01419 Encounter for gynecological examination (general) (routine) without abnormal findings: Secondary | ICD-10-CM

## 2011-12-17 DIAGNOSIS — Z124 Encounter for screening for malignant neoplasm of cervix: Secondary | ICD-10-CM

## 2011-12-17 DIAGNOSIS — I10 Essential (primary) hypertension: Secondary | ICD-10-CM

## 2011-12-17 DIAGNOSIS — E041 Nontoxic single thyroid nodule: Secondary | ICD-10-CM

## 2011-12-17 DIAGNOSIS — Z1231 Encounter for screening mammogram for malignant neoplasm of breast: Secondary | ICD-10-CM

## 2011-12-17 DIAGNOSIS — Z23 Encounter for immunization: Secondary | ICD-10-CM

## 2011-12-17 DIAGNOSIS — Z0000 Encounter for general adult medical examination without abnormal findings: Secondary | ICD-10-CM | POA: Insufficient documentation

## 2011-12-17 DIAGNOSIS — Z8619 Personal history of other infectious and parasitic diseases: Secondary | ICD-10-CM

## 2011-12-17 DIAGNOSIS — Z1151 Encounter for screening for human papillomavirus (HPV): Secondary | ICD-10-CM | POA: Insufficient documentation

## 2011-12-17 MED ORDER — VALACYCLOVIR HCL 500 MG PO TABS: 500.0000 mg | ORAL_TABLET | Freq: Two times a day (BID) | ORAL | Status: DC

## 2011-12-17 NOTE — Addendum Note (Signed)
Addended by: Sydell Axon C on: 12/17/2011 01:42 PM   Modules accepted: Orders

## 2011-12-17 NOTE — Assessment & Plan Note (Signed)
Pending [place] for L lobe microcalcifications.  ?dystrophic calcification. Thyroid nodule biopsy - benign follicular nodule.

## 2011-12-17 NOTE — Assessment & Plan Note (Signed)
Preventative protocols reviewed and updated unless pt declined. Discussed healthy diet and lifestyle.  Set up for mammogram today. Tdap today. Pelvic/pap and breast exam today.

## 2011-12-17 NOTE — Progress Notes (Signed)
Subjective:    Patient ID: Kendra Jackson, female    DOB: 1960-05-05, 51 y.o.   MRN: [id number]  HPI CC: CPE  No concerns today.  Swears by her apple cider vinegar to control blood pressure but otherwise compliant with antihypertensive meds.  Thyroid nodule and calcified areas s/p biopsy normal (06/2011) but rec rpt [place] 12/2011, already scheduled.  H/o genital herpes, requests antiviral for this.  Feels outbreak coming on - tingling/uncomfortable sensation in vulvar area.  Caffeine: rare Lives alone, no pets. Grown son 29yo, grandson died suddenly at [employer] 2008/08/14 (drowning) - lots of stress from this. Occupation: Paramedic, works third shift Activity: no regular exercise but bought treadmill Diet: good water, fruits daily, vegetables occasionally  Preventative: Last CPE was 10/2010. Well woman 10/2010 - pap smear - remote h/o abnormal cells s/p cryotherapy.  Gets pap smears yearly. LMP - 04/12/2009, no bleeding since then. Mammogram - 12/2010 normal - would like [place] to schedule [facility]). Colonoscopy Aug 15, 2011 - hemorrhoids, rpt 5 yrs given h/o polyps ([place]). Tetanus - Tdap today. Flu shot - declines.  Medications and allergies reviewed and updated in chart.  Past histories reviewed and updated if relevant as below. Patient Active Problem List  Diagnosis  . HTN (hypertension)  . Colon polyp  . Earlobe lesion  . Rectal pressure  . Abnormal large bowel motility  . Thyroid nodule  . Bronchitis  . Left foot pain  . Pain in lower back   Past Medical History  Diagnosis Date  . HTN (hypertension)   . History of colon polyps     hyperplastic polyp 08/15/2006, 2011-08-15 hemorrhoids, rec rpt 5 yrs  . Enlarged thyroid     h/o abnl labs but never treated  . Sickle cell trait   . 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 Abnormal pigmentation of skin 2004    L ear antihelix - bx consistent with SK Jarold Motto), recheck stable per Dr. Roseanne Kaufman  . History of  herpes genitalis   . HLD (hyperlipidemia)   . IBS (irritable bowel syndrome)   . Family history of breast cancer in first degree relative   . Reflux esophagitis     PPI  . Anxiety   . Thyroid nodule 06/2011    R nodule, FNA biopsy = benign follicular  . Constipation     severe requiring laxatives, failed miralax   Past Surgical History  Procedure Date  . Tvs 11/2008    multiple uterine fibroids  . Colonoscopy 15-Aug-2011    hemorrhoids, rpt due 5 yrs given h/o polyps   History  Substance Use Topics  . Smoking status: Never Smoker   . Smokeless tobacco: Never Used  . Alcohol Use: Yes     wine occassionally   Family History  Problem Relation Age of Onset  . Heart disease Mother   . Heart disease Father     pig valve  . Coronary artery disease Father   . Cancer Father     prostate  . Cancer Sister     breast  . Diabetes Sister   . Mental illness Sister     schizophrenia  . Cancer Brother     appendix tumor, prostate  . Diabetes Brother   . Mental illness Brother     bipolar  . Cancer Maternal Aunt     bone  . Cancer Maternal Uncle     bladder  . Cancer Maternal Grandmother     colon  .  Stroke Maternal Grandfather   . Coronary artery disease Paternal Grandfather   . Cancer Cousin     ovarian, bone   Allergies  Allergen Reactions  . Ace Inhibitors   . Naprosyn (Naproxen) Other (See Comments)    abd pain, diarrhea  . Neosporin (Neomycin-Bacitracin Zn-Polymyx) Rash   Current Outpatient Prescriptions on File Prior to Visit  Medication Sig Dispense Refill  . aspirin 81 MG tablet Take 81 mg by mouth daily.      Marland Kitchen atenolol (TENORMIN) 50 MG tablet take 1 tablet by mouth every morning and then take 1 tablet by mouth every evening  60 tablet  3  . hydrochlorothiazide (MICROZIDE) 12.5 MG capsule Take 1 capsule (12.5 mg total) by mouth daily.  30 capsule  6  . LORazepam (ATIVAN) 1 MG tablet Take 1 tablet (1 mg total) by mouth at bedtime as needed for anxiety.  60 tablet  0   . Misc Natural Products (APPLE CIDER VINEGAR) TABS Take by mouth daily.      . Multiple Vitamin (MULTIVITAMIN) tablet Take 1 tablet by mouth daily.      Marland Kitchen omeprazole (PRILOSEC) 40 MG capsule Take 40 mg by mouth daily. QOD         Review of Systems  Constitutional: Negative for fever, chills, activity change, appetite change, fatigue and unexpected weight change.  HENT: Negative for hearing loss and neck pain.   Eyes: Negative for visual disturbance.  Respiratory: Negative for cough, chest tightness, shortness of breath and wheezing.   Cardiovascular: Negative for chest pain, palpitations and leg swelling.  Gastrointestinal: Negative for nausea, vomiting, abdominal pain, diarrhea, constipation, blood in stool and abdominal distention.  Genitourinary: Negative for hematuria and difficulty urinating.  Musculoskeletal: Negative for myalgias and arthralgias.  Skin: Negative for rash.  Neurological: Negative for dizziness, seizures, syncope and headaches.  Hematological: Does not bruise/bleed easily.  Psychiatric/Behavioral: Negative for dysphoric mood. The patient is not nervous/anxious.        Objective:   Physical Exam  Nursing note and vitals reviewed. Constitutional: She is oriented to person, place, and time. She appears well-developed and well-nourished. No distress.  HENT:  Head: Normocephalic and atraumatic.  Right Ear: External ear normal.  Left Ear: External ear normal.  Nose: Nose normal.  Mouth/Throat: Oropharynx is clear and moist. No oropharyngeal exudate.  Eyes: Conjunctivae normal and EOM are normal. Pupils are equal, round, and reactive to light. No scleral icterus.  Neck: Normal range of motion. Neck supple. No thyromegaly present.  Cardiovascular: Normal rate, regular rhythm, normal heart sounds and intact distal pulses.   No murmur heard. Pulses:      Radial pulses are 2+ on the right side, and 2+ on the left side.  Pulmonary/Chest: Effort normal and breath  sounds normal. No respiratory distress. She has no wheezes. She has no rales. Right breast exhibits no inverted nipple, no mass, no nipple discharge, no skin change and no tenderness. Left breast exhibits no inverted nipple, no mass, no nipple discharge, no skin change and no tenderness. Breasts are symmetrical.  Abdominal: Soft. Bowel sounds are normal. She exhibits no distension and no mass. There is no tenderness. There is no rebound and no guarding.  Genitourinary: Vagina normal and uterus normal. Pelvic exam was performed with patient supine. There is no rash, tenderness, lesion or injury on the right labia. There is no rash, tenderness, lesion or injury on the left labia. Uterus is not deviated, not enlarged, not fixed and not tender. Cervix  exhibits no motion tenderness, no discharge and no friability. Right adnexum displays no mass, no tenderness and no fullness. Left adnexum displays no mass, no tenderness and no fullness. No erythema, tenderness or bleeding around the vagina. No foreign body around the vagina. No signs of injury around the vagina. No vaginal discharge found.       Pap performed  Musculoskeletal: Normal range of motion. She exhibits no edema.  Lymphadenopathy:    She has no cervical adenopathy.    She has no axillary adenopathy.       Right axillary: No lateral adenopathy present.       Left axillary: No lateral adenopathy present.      Right: No supraclavicular adenopathy present.       Left: No supraclavicular adenopathy present.  Neurological: She is alert and oriented to person, place, and time.       CN grossly intact, station and gait intact  Skin: Skin is warm and dry. No rash noted.  Psychiatric: She has a normal mood and affect. Her behavior is normal. Judgment and thought content normal.       Assessment & Plan:

## 2011-12-17 NOTE — Assessment & Plan Note (Signed)
Sent in valtrex 500mg  bid for 3 day course prn genital herpes outbreak.

## 2011-12-17 NOTE — Addendum Note (Signed)
Addended by: Sydell Axon C on: 12/17/2011 03:05 PM   Modules accepted: Orders

## 2011-12-17 NOTE — Patient Instructions (Signed)
Good to see you today, call us with questions. Tetanus and pertussis combo shot today. We will be in touch about the thyroid ultrasound. Valtrex sent in. Pass by Marion's office to schedule mammogram at [facility].

## 2011-12-17 NOTE — Addendum Note (Signed)
Addended by: Sydell Axon C on: 12/17/2011 03:11 PM   Modules accepted: Orders

## 2011-12-17 NOTE — Assessment & Plan Note (Signed)
Chronic, stable. Continue current regimen. 

## 2011-12-20 ENCOUNTER — Encounter: Payer: Self-pay | Admitting: *Deleted

## 2011-12-25 HISTORY — PX: OTHER SURGICAL HISTORY: SHX169

## 2012-01-16 ENCOUNTER — Ambulatory Visit
Admission: RE | Admit: 2012-01-16 | Discharge: 2012-01-16 | Disposition: A | Discharge status: Home / Self Care | Payer: Federal, State, Local not specified - PPO | Source: Ambulatory Visit | Attending: Family Medicine | Admitting: Family Medicine

## 2012-01-16 DIAGNOSIS — E049 Nontoxic goiter, unspecified: Secondary | ICD-10-CM

## 2012-01-17 ENCOUNTER — Encounter: Payer: Self-pay | Admitting: Family Medicine

## 2012-02-04 ENCOUNTER — Ambulatory Visit: Payer: Self-pay | Admitting: Family Medicine

## 2012-02-04 LAB — HM MAMMOGRAPHY: HM Mammogram: NORMAL

## 2012-02-05 ENCOUNTER — Encounter: Payer: Self-pay | Admitting: *Deleted

## 2012-02-05 ENCOUNTER — Encounter: Payer: Self-pay | Admitting: Family Medicine

## 2012-02-10 ENCOUNTER — Other Ambulatory Visit: Payer: Self-pay | Admitting: Family Medicine

## 2012-02-12 ENCOUNTER — Other Ambulatory Visit: Payer: Self-pay

## 2012-02-12 NOTE — Telephone Encounter (Signed)
Pt called back and said it was OK to leave refills at [facility]. Pt will call [facility] for refills.

## 2012-02-12 NOTE — Telephone Encounter (Signed)
Pt left v/m requesting refill for Atenolol and HCTZ to [facility]. Both meds just refilled to [facility]. Left v/m for pt to call back.

## 2012-02-27 ENCOUNTER — Encounter: Payer: Self-pay | Admitting: Family Medicine

## 2012-02-27 ENCOUNTER — Ambulatory Visit (INDEPENDENT_AMBULATORY_CARE_PROVIDER_SITE_OTHER): Payer: Federal, State, Local not specified - PPO | Admitting: Family Medicine

## 2012-02-27 VITALS — BP 120/78 | HR 68 | Temp 97.9°F | Ht 66.5 in | Wt 164.5 lb

## 2012-02-27 DIAGNOSIS — J208 Acute bronchitis due to other specified organisms: Secondary | ICD-10-CM | POA: Insufficient documentation

## 2012-02-27 DIAGNOSIS — J209 Acute bronchitis, unspecified: Secondary | ICD-10-CM

## 2012-02-27 MED ORDER — GUAIFENESIN-CODEINE 100-10 MG/5ML PO SYRP: 5.0000 mL | ORAL_SOLUTION | Freq: Every evening | ORAL | Status: DC | PRN

## 2012-02-27 NOTE — Patient Instructions (Signed)
I do think you have return of upper respiratory infection, possibly bronchitis, but I think this is more of a viral process that antibiotics won't help currently. Treat with conitnued robitusin or immediate release guaifenesin to help mobilize mucous out. Lots of fluid and lots of rest. Cough syrup prescription provided today in case needed. If not improving as expected into next week, or any worsening fever >101, productive cough please call me with update.

## 2012-02-27 NOTE — Progress Notes (Signed)
  Subjective:    Patient ID: Kendra Jackson, female    DOB: [birth year], 51 y.o.   MRN: [id number]  HPI CC: bronchitis? Cold?  Cold sxs 02/07/2012.  Those resolved well.  Then over last several days having worsening cough productive of clear mucous.  + coughing fits that have led to post tussive gagging.  + chest congestion.  Taking tussin cough medicine which did thin mucous out.    No fevers/chills, abd pain, nausea, ear or tooth pain, ST.  No PNDrainage.  No HA.  No h/o asthma but has used inhaler in past when with bronchitis No smokers at home.   + around sick niece recently.  Wt Readings from Last 3 Encounters:  02/27/12 164 lb 8 oz (74.617 kg)  12/17/11 165 lb 8 oz (75.07 kg)  11/29/11 164 lb 4 oz (74.503 kg)   Past Medical History  Diagnosis Date  . HTN (hypertension)   . History of colon polyps     hyperplastic polyp 2008, 2013 hemorrhoids, rec rpt 5 yrs  . Enlarged thyroid     h/o abnl labs but never treated  . Sickle cell trait   . 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 Abnormal pigmentation of skin 2004    L ear antihelix - bx consistent with SK Jarold Motto), recheck stable per Dr. Roseanne Kaufman  . History of herpes genitalis     valtrex prn  . HLD (hyperlipidemia)   . IBS (irritable bowel syndrome)   . Family history of breast cancer in first degree relative   . Reflux esophagitis     PPI  . Anxiety   . Thyroid nodule 06/2011    R nodule, FNA biopsy = benign follicular  . Constipation     severe requiring laxatives, failed miralax   Review of Systems Per HPI    Objective:   Physical Exam  Nursing note and vitals reviewed. Constitutional: She appears well-developed and well-nourished. No distress.  HENT:  Head: Normocephalic and atraumatic.  Right Ear: Hearing, tympanic membrane, external ear and ear canal normal.  Left Ear: Hearing, tympanic membrane, external ear and ear canal normal.  Nose: No mucosal edema or  rhinorrhea.  Mouth/Throat: Uvula is midline, oropharynx is clear and moist and mucous membranes are normal. No oropharyngeal exudate, posterior oropharyngeal edema, posterior oropharyngeal erythema or tonsillar abscesses.  Eyes: Conjunctivae normal and EOM are normal. Pupils are equal, round, and reactive to light. No scleral icterus.  Neck: Normal range of motion. Neck supple.  Cardiovascular: Normal rate, regular rhythm, normal heart sounds and intact distal pulses.   No murmur heard. Pulmonary/Chest: Effort normal and breath sounds normal. No respiratory distress. She has no wheezes. She has no rales.       Lungs clear, no cough today  Lymphadenopathy:    She has no cervical adenopathy.  Skin: Skin is warm and dry. No rash noted.       Assessment & Plan:

## 2012-02-27 NOTE — Assessment & Plan Note (Addendum)
Given short duration and lack of sxs today, anticipate viral bronchitis. Recommend supportive care as per instructions. Update if not improving as expected or any worsening for consideration of abx course. Pt agrees with plan.

## 2012-05-22 ENCOUNTER — Other Ambulatory Visit: Payer: Self-pay | Admitting: Family Medicine

## 2012-09-03 ENCOUNTER — Other Ambulatory Visit: Payer: Self-pay | Admitting: Family Medicine

## 2012-10-27 ENCOUNTER — Other Ambulatory Visit: Payer: Self-pay

## 2012-10-27 MED ORDER — VALACYCLOVIR HCL 500 MG PO TABS: ORAL_TABLET | ORAL | Status: DC

## 2012-10-27 MED ORDER — LORAZEPAM 1 MG PO TABS: 1.0000 mg | ORAL_TABLET | Freq: Every evening | ORAL | Status: DC | PRN

## 2012-10-27 NOTE — Telephone Encounter (Signed)
Pt left v/m requesting refill lorazepam and valacyclovir to [facility].Please advise.

## 2012-10-27 NOTE — Telephone Encounter (Signed)
plz phone in lorazepam. 

## 2012-10-28 NOTE — Telephone Encounter (Signed)
Rx called in as directed.   

## 2012-12-05 ENCOUNTER — Other Ambulatory Visit: Payer: Self-pay | Admitting: Family Medicine

## 2012-12-05 ENCOUNTER — Encounter: Payer: Self-pay | Admitting: Family Medicine

## 2012-12-05 DIAGNOSIS — I10 Essential (primary) hypertension: Secondary | ICD-10-CM

## 2012-12-05 DIAGNOSIS — E041 Nontoxic single thyroid nodule: Secondary | ICD-10-CM

## 2012-12-05 DIAGNOSIS — E785 Hyperlipidemia, unspecified: Secondary | ICD-10-CM | POA: Insufficient documentation

## 2012-12-09 ENCOUNTER — Telehealth: Payer: Self-pay

## 2012-12-09 NOTE — Telephone Encounter (Signed)
Pt's home phone no longer has answering machine and saw caller ID where someone called on 12/05/12 but no message; Lyla Son will chg contact # to pts cell phone. Dr Timoteo Expose CMA was not trying to reach pt.

## 2012-12-10 ENCOUNTER — Other Ambulatory Visit (INDEPENDENT_AMBULATORY_CARE_PROVIDER_SITE_OTHER): Payer: Federal, State, Local not specified - PPO

## 2012-12-10 DIAGNOSIS — E041 Nontoxic single thyroid nodule: Secondary | ICD-10-CM

## 2012-12-10 DIAGNOSIS — I10 Essential (primary) hypertension: Secondary | ICD-10-CM

## 2012-12-10 DIAGNOSIS — E785 Hyperlipidemia, unspecified: Secondary | ICD-10-CM

## 2012-12-10 LAB — BASIC METABOLIC PANEL
Calcium: 9.6 mg/dL (ref 8.4–10.5)
Creatinine, Ser: 0.8 mg/dL (ref 0.4–1.2)
GFR: 94.72 mL/min (ref >60.00)
Sodium: 138 mEq/L (ref 135–145)

## 2012-12-10 LAB — T4, FREE: Free T4: 0.81 ng/dL (ref 0.60–1.60)

## 2012-12-10 LAB — TSH: TSH: 0.28 u[IU]/mL — ABNORMAL LOW (ref 0.35–5.50)

## 2012-12-10 LAB — LIPID PANEL
HDL: 88.6 mg/dL (ref >39.00)
Triglycerides: 57 mg/dL (ref 0.0–149.0)

## 2012-12-10 LAB — LDL CHOLESTEROL, DIRECT: Direct LDL: 143.7 mg/dL

## 2012-12-16 ENCOUNTER — Encounter: Payer: Federal, State, Local not specified - PPO | Admitting: Family Medicine

## 2012-12-22 ENCOUNTER — Ambulatory Visit (INDEPENDENT_AMBULATORY_CARE_PROVIDER_SITE_OTHER): Payer: Federal, State, Local not specified - PPO | Admitting: Family Medicine

## 2012-12-22 ENCOUNTER — Encounter: Payer: Self-pay | Admitting: Family Medicine

## 2012-12-22 VITALS — BP 132/84 | HR 80 | Temp 98.0°F | Ht 66.5 in | Wt 167.0 lb

## 2012-12-22 DIAGNOSIS — E0590 Thyrotoxicosis, unspecified without thyrotoxic crisis or storm: Secondary | ICD-10-CM

## 2012-12-22 DIAGNOSIS — Z1231 Encounter for screening mammogram for malignant neoplasm of breast: Secondary | ICD-10-CM

## 2012-12-22 DIAGNOSIS — Z23 Encounter for immunization: Secondary | ICD-10-CM

## 2012-12-22 DIAGNOSIS — E785 Hyperlipidemia, unspecified: Secondary | ICD-10-CM

## 2012-12-22 DIAGNOSIS — E041 Nontoxic single thyroid nodule: Secondary | ICD-10-CM

## 2012-12-22 DIAGNOSIS — I10 Essential (primary) hypertension: Secondary | ICD-10-CM

## 2012-12-22 DIAGNOSIS — Z0000 Encounter for general adult medical examination without abnormal findings: Secondary | ICD-10-CM

## 2012-12-22 HISTORY — DX: Thyrotoxicosis, unspecified without thyrotoxic crisis or storm: E05.90

## 2012-12-22 NOTE — Assessment & Plan Note (Signed)
i've ordered rpt thyroid US today. Thyroid nodule biopsy 06/2011 - benign follicular nodule

## 2012-12-22 NOTE — Assessment & Plan Note (Signed)
Low TSH but normal Free T4/T3 today. rec rpt TFT in 6 mo, consider uptake scan

## 2012-12-22 NOTE — Assessment & Plan Note (Signed)
Preventative protocols reviewed and updated unless pt declined. Discussed healthy diet and lifestyle.  Agrees to flu shot today. Pap deferred until 2016. Breast exam today.

## 2012-12-22 NOTE — Assessment & Plan Note (Signed)
Chronic, stable continue current regimen.  

## 2012-12-22 NOTE — Progress Notes (Signed)
Subjective:    Patient ID: Kendra Jackson, female    DOB: 04/09/1960, 52 y.o.   MRN: [id number]  HPI CC: CPE  Thyroid nodule and calcified areas s/p biopsy normal (06/2011), stable ultrasound 12/2011.  Due for rpt today.  Caffeine: rare  Lives alone, no pets. Grown son 29yo, grandson died suddenly at [employer] August 31, 2008 (drowning) - lots of stress from this.  Occupation: Paramedic, works third shift  Activity: no regular exercise but bought treadmill  Diet: good water, fruits daily, vegetables occasionally   Preventative:  Well woman 2011-09-01 - pap smear - remote h/o abnormal cells s/p cryotherapy. Next due 09/01/14. LMP - 04/12/2009, no bleeding since then.  Mammogram - 12/2011 normal, will schedule rpt Colonoscopy 09-01-11 - hemorrhoids, rpt 5 yrs given h/o polyps ([place]).  Tetanus - Tdap 2011/09/01 Flu shot - today  Medications and allergies reviewed and updated in chart.  Past histories reviewed and updated if relevant as below. Patient Active Problem List   Diagnosis Date Noted  . HLD (hyperlipidemia) 12/05/2012  . Healthcare maintenance 12/17/2011  . History of herpes genitalis   . Pain in lower back 11/29/2011  . Left foot pain 11/07/2011  . Thyroid nodule 07/02/2011  . Earlobe lesion 05/16/2011  . Rectal pressure 05/16/2011  . HTN (hypertension)   . Colon polyp   . Abnormal large bowel motility    Past Medical History  Diagnosis Date  . HTN (hypertension)   . History of colon polyps     hyperplastic polyp 2006-09-01, 2011-09-01 hemorrhoids, rec rpt 5 yrs  . Enlarged thyroid     h/o abnl labs but never treated  . Sickle cell trait   . 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 Abnormal pigmentation of skin 2004    L ear antihelix - bx consistent with SK Jarold Motto), recheck stable per Dr. Roseanne Kaufman  . History of herpes genitalis     valtrex prn  . HLD (hyperlipidemia)   . IBS (irritable bowel syndrome)   . Family history of breast cancer in first degree  relative   . Reflux esophagitis     PPI  . Anxiety   . Thyroid nodule 06/2011    R nodule, FNA biopsy = benign follicular, stable [place] (12/2011)  . Constipation     severe requiring laxatives, failed miralax   Past Surgical History  Procedure Laterality Date  . [facility]vs  11/2008    multiple uterine fibroids  . Colonoscopy  2011/09/01    hemorrhoids, rpt due 5 yrs given h/o polyps  . Thyroid US  12/2011    1cm solid nodule lower R pole (stable), no significant nodule on left side, rec rpt [place] in 1 yr   History  Substance Use Topics  . Smoking status: Never Smoker   . Smokeless tobacco: Never Used  . Alcohol Use: Yes     Comment: wine occassionally   Family History  Problem Relation Age of Onset  . Heart disease Mother   . Heart disease Father     pig valve  . Coronary artery disease Father   . Cancer Father     prostate  . Cancer Sister     breast  . Diabetes Sister   . Mental illness Sister     schizophrenia  . Cancer Brother     appendix tumor, prostate  . Diabetes Brother   . Mental illness Brother     bipolar  . Cancer Maternal Aunt  bone  . Cancer Maternal Uncle     bladder  . Cancer Maternal Grandmother     colon  . Stroke Maternal Grandfather   . Coronary artery disease Paternal Grandfather   . Cancer Cousin     ovarian, bone   Allergies  Allergen Reactions  . Ace Inhibitors   . Naprosyn [Naproxen] Other (See Comments)    abd pain, diarrhea  . Neosporin [Neomycin-Bacitracin Zn-Polymyx] Rash   Current Outpatient Prescriptions on File Prior to Visit  Medication Sig Dispense Refill  . aspirin 81 MG tablet Take 81 mg by mouth daily.      Marland Kitchen atenolol (TENORMIN) 50 MG tablet take 1 tablet by mouth every morning and then take 1 tablet by mouth every evening  60 tablet  11  . hydrochlorothiazide (MICROZIDE) 12.5 MG capsule take 1 capsule by mouth once daily  30 capsule  11  . LORazepam (ATIVAN) 1 MG tablet Take 1 tablet (1 mg total) by mouth at bedtime as needed  for anxiety.  60 tablet  0  . Multiple Vitamin (MULTIVITAMIN) tablet Take 1 tablet by mouth daily.      Marland Kitchen omeprazole (PRILOSEC) 40 MG capsule take 1 capsule by mouth once daily  30 capsule  11  . valACYclovir (VALTREX) 500 MG tablet TAKE 1 TABLET (500 MG TOTAL) BY MOUTH 2 (TWO) TIMES DAILY. FOR 3 DAYS FOR OUTBREAK (6 PILLS TOTAL).  24 tablet  0  . guaiFENesin-codeine (ROBITUSSIN AC) 100-10 MG/5ML syrup Take 5 mLs by mouth at bedtime as needed for cough (sedation precautions).  180 mL  0  . Misc Natural Products (APPLE CIDER VINEGAR) TABS Take by mouth daily.       No current facility-administered medications on file prior to visit.    Review of Systems  Constitutional: Negative for fever, chills, activity change, appetite change, fatigue and unexpected weight change.  HENT: Negative for hearing loss and neck pain.   Eyes: Negative for visual disturbance.  Respiratory: Negative for cough, chest tightness, shortness of breath and wheezing.   Cardiovascular: Negative for chest pain, palpitations and leg swelling.  Gastrointestinal: Positive for nausea. Negative for vomiting, abdominal pain, diarrhea, constipation, blood in stool and abdominal distention.  Genitourinary: Negative for hematuria and difficulty urinating.  Musculoskeletal: Negative for myalgias and arthralgias.  Skin: Negative for rash.  Neurological: Negative for dizziness, seizures, syncope and headaches.  Hematological: Negative for adenopathy. Does not bruise/bleed easily.  Psychiatric/Behavioral: Negative for dysphoric mood. The patient is not nervous/anxious.        Objective:   Physical Exam  Nursing note and vitals reviewed. Constitutional: She is oriented to person, place, and time. She appears well-developed and well-nourished. No distress.  HENT:  Head: Normocephalic and atraumatic.  Right Ear: External ear normal.  Left Ear: External ear normal.  Nose: Nose normal.  Mouth/Throat: Oropharynx is clear and moist.  No oropharyngeal exudate.  Eyes: Conjunctivae and EOM are normal. Pupils are equal, round, and reactive to light. No scleral icterus.  Neck: Normal range of motion. Neck supple. No thyromegaly present.  Cardiovascular: Normal rate, regular rhythm, normal heart sounds and intact distal pulses.   No murmur heard. Pulses:      Radial pulses are 2+ on the right side, and 2+ on the left side.  Pulmonary/Chest: Effort normal and breath sounds normal. No respiratory distress. She has no wheezes. She has no rales. Right breast exhibits no inverted nipple, no mass, no nipple discharge, no skin change and no tenderness. Left  breast exhibits no inverted nipple, no mass, no nipple discharge, no skin change and no tenderness. Breasts are symmetrical.  Abdominal: Soft. Bowel sounds are normal. She exhibits no distension and no mass. There is no tenderness. There is no rebound and no guarding.  Musculoskeletal: Normal range of motion. She exhibits no edema and no tenderness.  Lymphadenopathy:    She has no cervical adenopathy.    She has no axillary adenopathy.       Right axillary: No lateral adenopathy present.       Left axillary: No lateral adenopathy present.      Right: No supraclavicular adenopathy present.  Neurological: She is alert and oriented to person, place, and time.  CN grossly intact, station and gait intact  Skin: Skin is warm and dry. No rash noted.  Psychiatric: She has a normal mood and affect. Her behavior is normal. Judgment and thought content normal.       Assessment & Plan:

## 2012-12-22 NOTE — Assessment & Plan Note (Signed)
Borderline off meds - there is family history of heart disease Discussed dietary changes to help lower LDL.

## 2012-12-22 NOTE — Patient Instructions (Addendum)
We will schedule mammogram - if you haven't heard from [place] about mammogram by next week, give [place] a call. We will schedule your thyroid ultrasound. Flu shot today. Good to see you today, call us with questions. Return as needed or in 1 year for next physical. Return in 6 months for blood work to recheck thyroid

## 2012-12-30 ENCOUNTER — Ambulatory Visit
Admission: RE | Admit: 2012-12-30 | Discharge: 2012-12-30 | Disposition: A | Discharge status: Home / Self Care | Payer: Federal, State, Local not specified - PPO | Source: Ambulatory Visit | Attending: Family Medicine | Admitting: Family Medicine

## 2012-12-30 DIAGNOSIS — E041 Nontoxic single thyroid nodule: Secondary | ICD-10-CM

## 2012-12-30 DIAGNOSIS — E0590 Thyrotoxicosis, unspecified without thyrotoxic crisis or storm: Secondary | ICD-10-CM

## 2012-12-31 ENCOUNTER — Encounter: Payer: Self-pay | Admitting: Family Medicine

## 2013-02-04 ENCOUNTER — Ambulatory Visit: Payer: Self-pay | Admitting: Family Medicine

## 2013-02-04 ENCOUNTER — Encounter: Payer: Self-pay | Admitting: Family Medicine

## 2013-02-04 LAB — HM MAMMOGRAPHY: HM Mammogram: NORMAL

## 2013-02-05 ENCOUNTER — Encounter: Payer: Self-pay | Admitting: *Deleted

## 2013-02-16 ENCOUNTER — Other Ambulatory Visit: Payer: Self-pay | Admitting: Family Medicine

## 2013-06-11 ENCOUNTER — Ambulatory Visit (INDEPENDENT_AMBULATORY_CARE_PROVIDER_SITE_OTHER): Payer: Federal, State, Local not specified - PPO | Admitting: Family Medicine

## 2013-06-11 ENCOUNTER — Encounter: Payer: Self-pay | Admitting: Family Medicine

## 2013-06-11 VITALS — BP 142/90 | HR 96 | Temp 97.9°F | Wt 166.8 lb

## 2013-06-11 DIAGNOSIS — R202 Paresthesia of skin: Secondary | ICD-10-CM | POA: Insufficient documentation

## 2013-06-11 DIAGNOSIS — K2100 Gastro-esophageal reflux disease with esophagitis, without bleeding: Secondary | ICD-10-CM

## 2013-06-11 DIAGNOSIS — R209 Unspecified disturbances of skin sensation: Secondary | ICD-10-CM

## 2013-06-11 DIAGNOSIS — E0590 Thyrotoxicosis, unspecified without thyrotoxic crisis or storm: Secondary | ICD-10-CM

## 2013-06-11 MED ORDER — RANITIDINE HCL 150 MG PO TABS: 150.0000 mg | ORAL_TABLET | Freq: Every day | ORAL | Status: DC

## 2013-06-11 NOTE — Assessment & Plan Note (Signed)
H/o this in chart, unsure if dx by EGD.  Did not respond to omeprazole 40mg  daily.  Will provide with nexium 20mg  samples today.

## 2013-06-11 NOTE — Assessment & Plan Note (Signed)
Of 1 mo duration, exam not consistent with CTS but story suspicious for this - recommended wrist brace esp at night time.  If persistent discomfort to let me know. Check B12 and TSH today.

## 2013-06-11 NOTE — Patient Instructions (Signed)
nexium 20mg  samples provided today. blood work today and we will call you with results. May try brace during sleep to see if this will help morning symptoms. Cancel march lab appointment.

## 2013-06-11 NOTE — Progress Notes (Signed)
Pre visit review using our clinic review tool, if applicable. No additional management support is needed unless otherwise documented below in the visit note. 

## 2013-06-11 NOTE — Assessment & Plan Note (Signed)
Recheck today TSH along with free T4/T3. Check TSI. If abnormal, consider endo referral to discuss uptake scan in setting of new paresthesias. Denies fmhx thyroid disease.

## 2013-06-11 NOTE — Progress Notes (Signed)
BP 142/90  Pulse 96  Temp(Src) 97.9 F (36.6 C) (Oral)  Wt 166 lb 12.8 oz (75.66 kg)   CC: L arm numbness  Subjective:    Patient ID: [name], female    DOB: 12/01/1960, 53 y.o.   MRN: [id number]  HPI: Clydene Burack is a 53 y.o. female presenting on 06/11/2013 for Acute Visit   For last month, noticing L arm discomfort described as tingling/numbness of palm and palmar digits, also with some pressure of upper arm and forearm.  Worse first thing in am when she wakes up.  No neck pain or shoulder pain.  No chest pain.  No trouble with right hand. Occasional L thigh cramps  Increased indigestion recently noted - hasn't been taking omeprazole 40mg  but rather using zantac generic daily.  Omeprazole never really helped.  Having some abd discomfort with white sauces as well.  Takes care of mother every weekend.  Mother with dementia, struggling.  FTT.  H/o thyroid nodule s/p benign FNA biopsy 06/2011, and stable thyroid US subsequently. H/o subclinical hyperthyroidism monitoring with blood work - due for recheck.  Past Medical History  Diagnosis Date  . HTN (hypertension)   . History of colon polyps     hyperplastic polyp 2008, 2013 hemorrhoids, rec rpt 5 yrs  . Enlarged thyroid     h/o abnl labs but never treated  . Sickle cell trait   . 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 Abnormal pigmentation of skin 2004    L ear antihelix - bx consistent with SK Sharlett Iles), recheck stable per Dr. Kellie Moor  . History of herpes genitalis     valtrex prn  . HLD (hyperlipidemia)   . IBS (irritable bowel syndrome)   . Family history of breast cancer in first degree relative   . Reflux esophagitis     PPI  . Anxiety   . Thyroid nodule 06/2011    R nodule, FNA biopsy = benign follicular, stable [place] (70/7867 and 12/2012)  . Constipation     severe requiring laxatives, failed miralax     Relevant past medical, surgical, family and social  history reviewed and updated as indicated.  Allergies and medications reviewed and updated. Current Outpatient Prescriptions on File Prior to Visit  Medication Sig  . aspirin 81 MG tablet Take 81 mg by mouth daily.  Marland Kitchen atenolol (TENORMIN) 50 MG tablet take 1 tablet by mouth every morning and then take 1 tablet by mouth every evening  . hydrochlorothiazide (MICROZIDE) 12.5 MG capsule take 1 capsule by mouth once daily  . LORazepam (ATIVAN) 1 MG tablet Take 1 tablet (1 mg total) by mouth at bedtime as needed for anxiety.  . Multiple Vitamin (MULTIVITAMIN) tablet Take 1 tablet by mouth daily.  . valACYclovir (VALTREX) 500 MG tablet TAKE 1 TABLET (500 MG TOTAL) BY MOUTH 2 (TWO) TIMES DAILY. FOR 3 DAYS FOR OUTBREAK (6 PILLS TOTAL).  . Misc Natural Products (APPLE CIDER VINEGAR) TABS Take by mouth daily.   No current facility-administered medications on file prior to visit.    Review of Systems Per HPI unless specifically indicated above    Objective:    BP 142/90  Pulse 96  Temp(Src) 97.9 F (36.6 C) (Oral)  Wt 166 lb 12.8 oz (75.66 kg)  Physical Exam  Nursing note and vitals reviewed. Constitutional: She is oriented to person, place, and time. She appears well-developed and well-nourished. No distress.  HENT:  Mouth/Throat: Oropharynx  is clear and moist. No oropharyngeal exudate.  Neck: Normal range of motion. Neck supple. No thyromegaly present.  Cardiovascular: Normal rate, regular rhythm, normal heart sounds and intact distal pulses.   No murmur heard. Pulmonary/Chest: Effort normal and breath sounds normal. No respiratory distress. She has no wheezes. She has no rales.  Musculoskeletal: She exhibits no edema.  FROM at shoulders and neck Neg spurling No midline spine tenderness. FROM at L wrist, no pain to palpation of hand, forearm or upper left arm  Lymphadenopathy:    She has no cervical adenopathy.  Neurological: She is alert and oriented to person, place, and time.    Reflex Scores:      Bicep reflexes are 2+ on the right side and 2+ on the left side.      Brachioradialis reflexes are 2+ on the right side and 2+ on the left side. Neg tinel and phalen Sensation and strength intact bilateral upper extremities Grip strength intact  Skin: Skin is warm and dry. No rash noted.  Psychiatric: She has a normal mood and affect.       Assessment & Plan:   Problem List Items Addressed This Visit   Left hand paresthesia - Primary     Of 1 mo duration, exam not consistent with CTS but story suspicious for this - recommended wrist brace esp at night time.  If persistent discomfort to let me know. Check B12 and TSH today.    Relevant Orders      Vitamin B12      Folate      Vit D  25 hydroxy (rtn osteoporosis monitoring)      TSH   Reflux esophagitis     H/o this in chart, unsure if dx by EGD.  Did not respond to omeprazole 40mg  daily.  Will provide with nexium 20mg  samples today.    Subclinical hyperthyroidism     Recheck today TSH along with free T4/T3. Check TSI. If abnormal, consider endo referral to discuss uptake scan in setting of new paresthesias. Denies fmhx thyroid disease.    Relevant Orders      TSH      T4, Free      Thyroid stimulating immunoglobulin       Follow up plan: Return if symptoms worsen or fail to improve, for as needed.

## 2013-06-12 LAB — VITAMIN B12: Vitamin B-12: 780 pg/mL (ref 211–911)

## 2013-06-12 LAB — VITAMIN D 25 HYDROXY (VIT D DEFICIENCY, FRACTURES): VIT D 25 HYDROXY: 52 ng/mL (ref 30–89)

## 2013-06-12 LAB — TSH: TSH: 0.13 u[IU]/mL — ABNORMAL LOW (ref 0.35–5.50)

## 2013-06-12 LAB — T4, FREE: Free T4: 0.67 ng/dL (ref 0.60–1.60)

## 2013-06-12 LAB — FOLATE: Folate: >24.8 ng/mL (ref >5.9)

## 2013-06-12 IMAGING — US US THYROID
1 series · 17 of 25 positions shown · non-contrast
Comparison: none

REASON FOR EXAM: rt thyroid nodule
COMMENTS:

PROCEDURE:     GERAINT - GERAINT THYROID  - June 29, 2011  [DATE]
RESULT:

[Series 1: us thyroid · 17 of 31 slices shown]
[im 1/31]
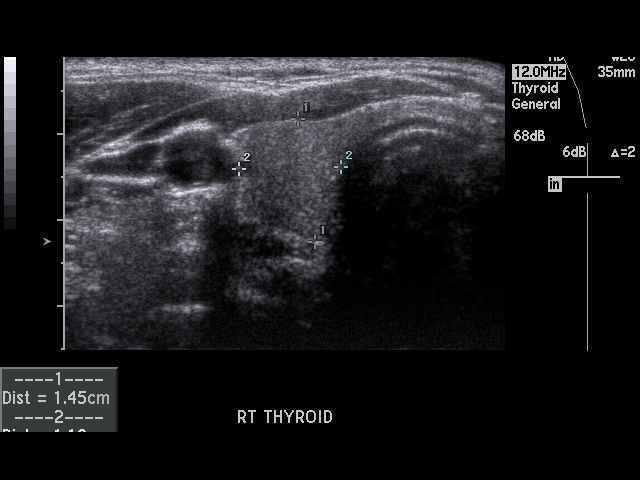
[im 3/31]
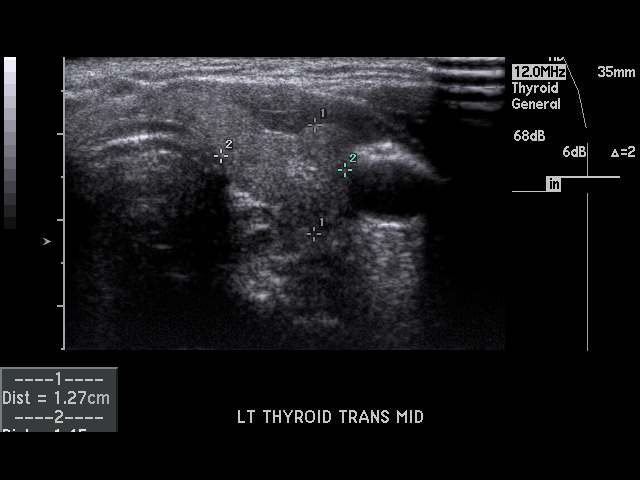
[im 4/31]
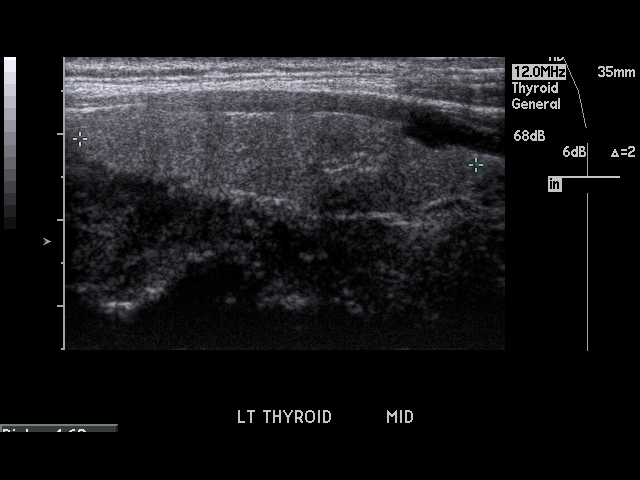
[im 7/31]
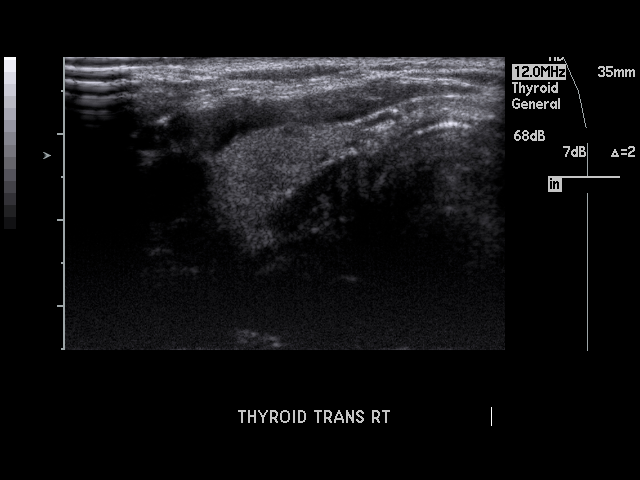
[im 8/31]
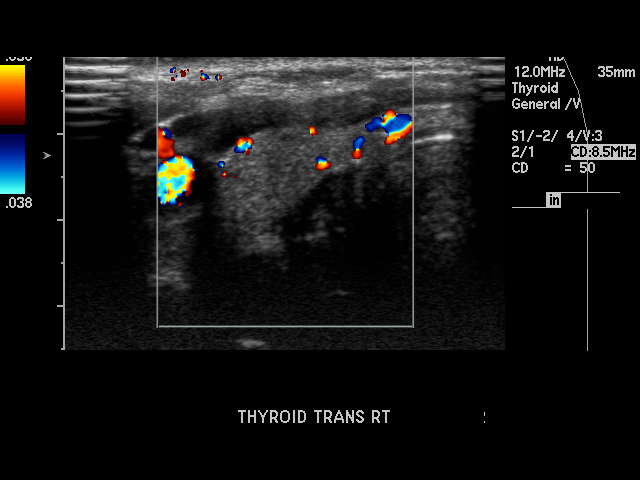
[im 11/31]
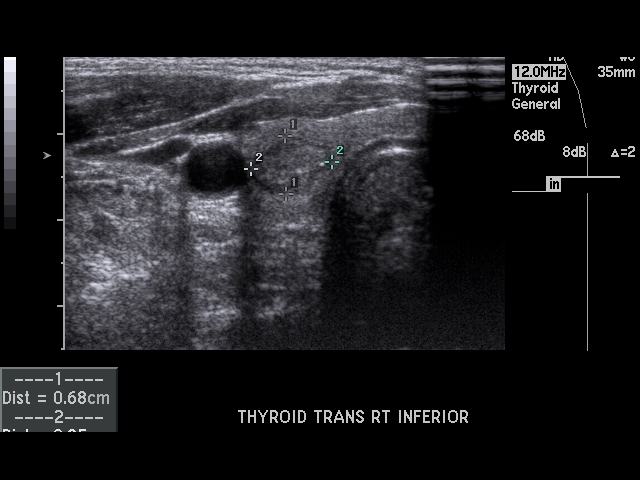
[im 12/31]
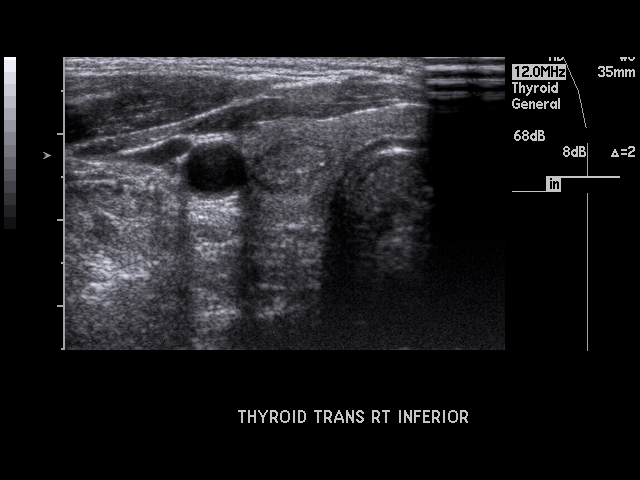
[im 14/31]
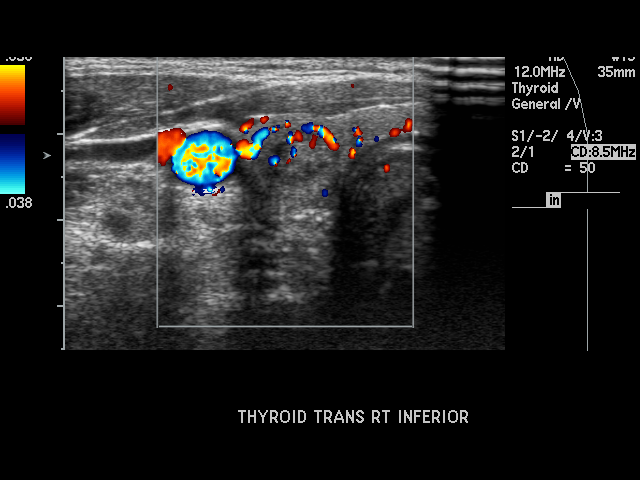
[im 16/31]
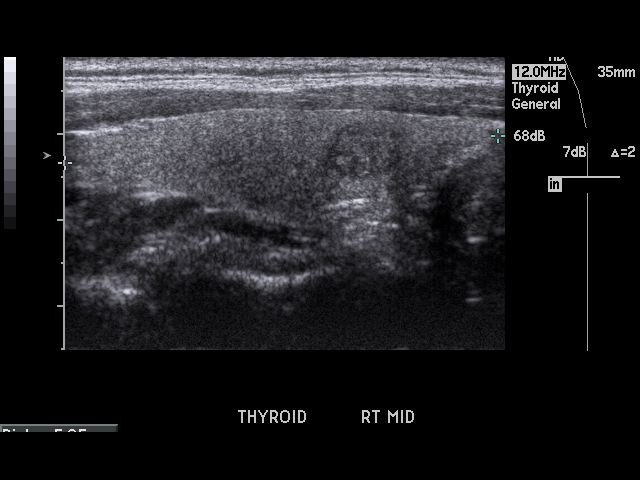
[im 17/31]
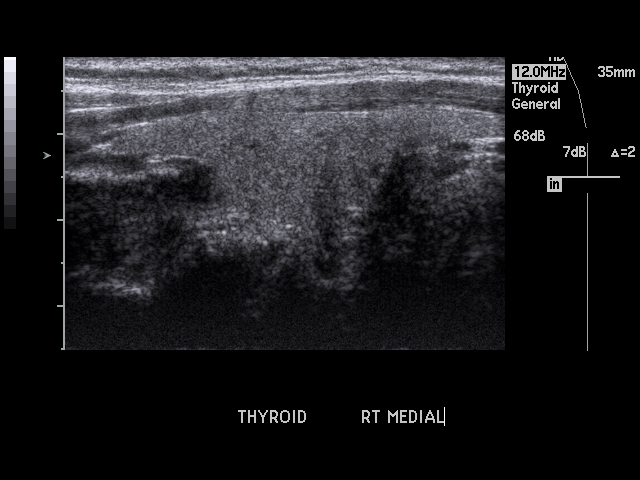
[im 19/31]
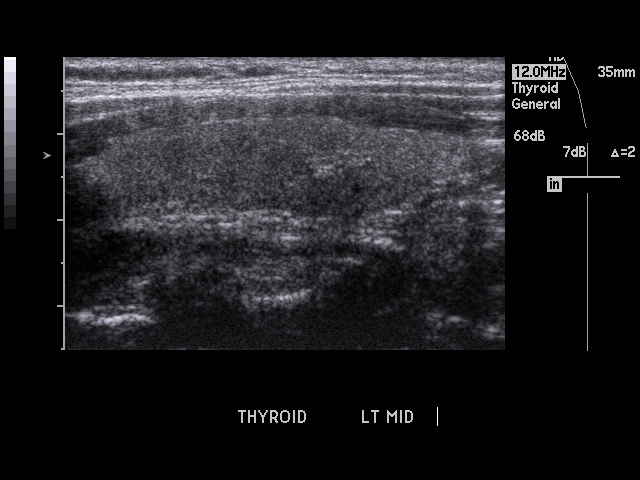
[im 21/31]
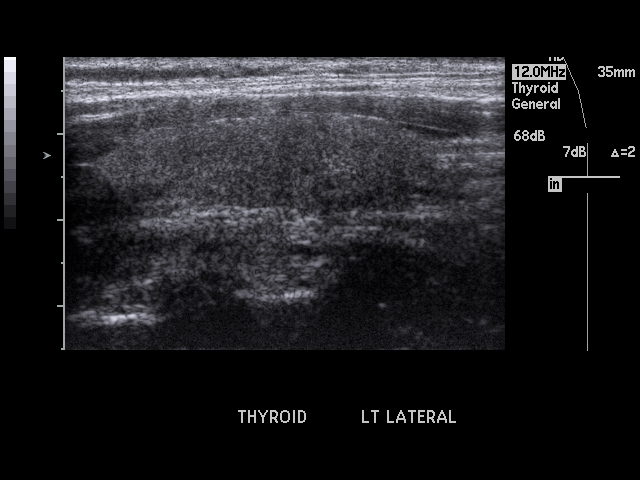
[im 23/31]
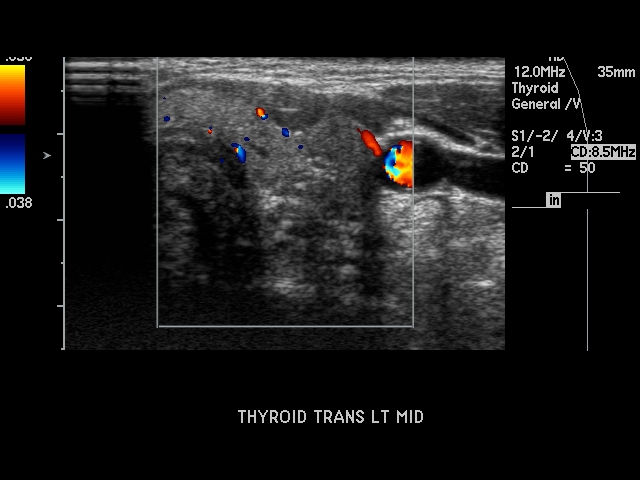
[im 24/31]
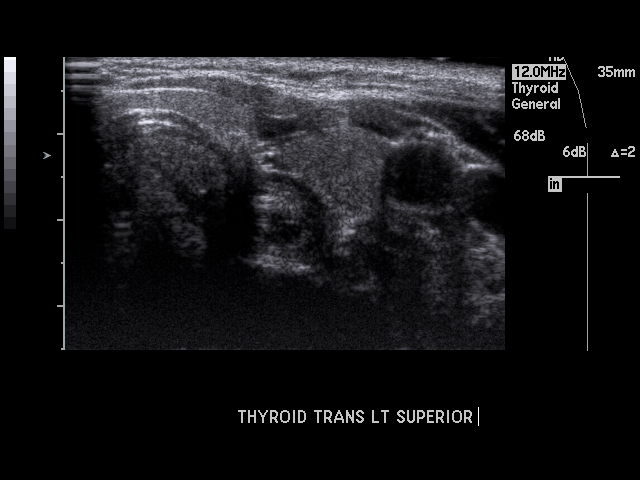
[im 27/31]
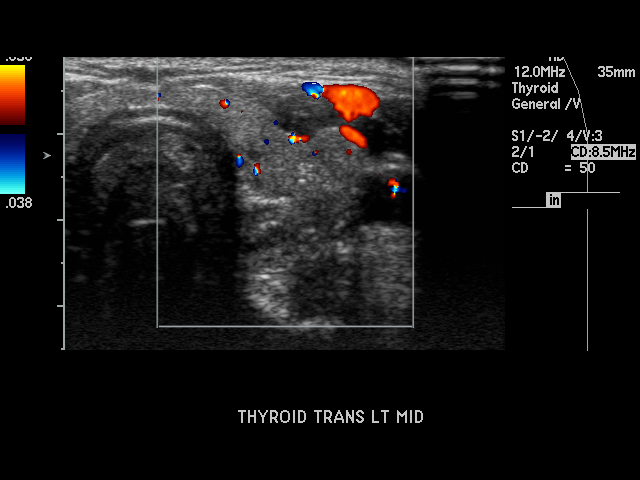
[im 28/31]
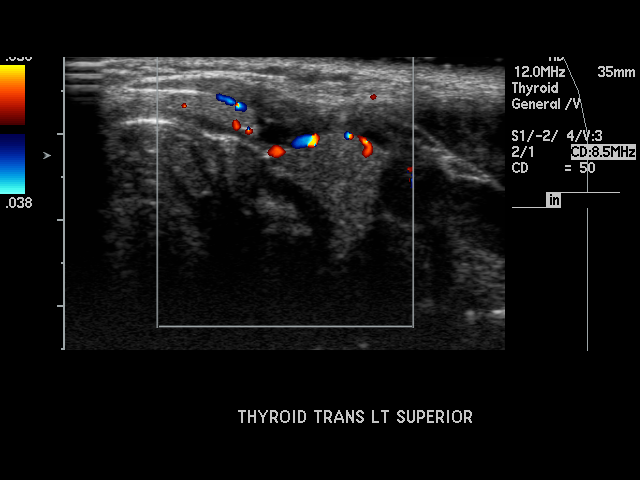
[im 31/31]
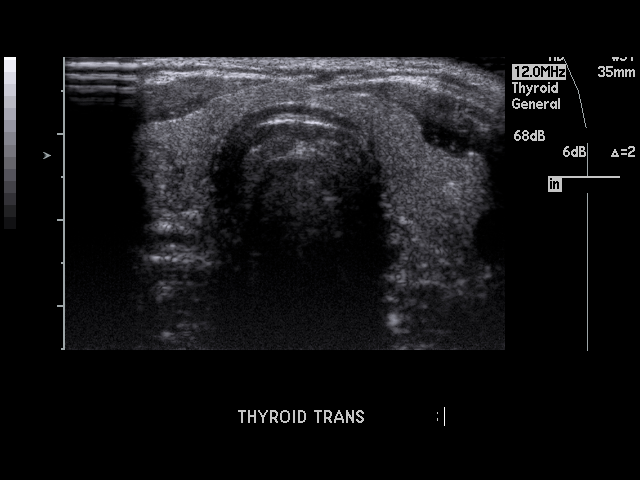

[17 of 25 positions shown; findings below may reference images not displayed]

FINDINGS: The right lobe of the thyroid measures 1.45 x 1.19 x 4.4 cm.
Isthmus thickness is 2.6 mm. The left lobe measures 1.27 x 1.45 x 4.62 cm.

Evaluation of the right lobe of the thyroid demonstrates a slightly
hypoechoic nodule within the inferior pole of the right lobe of thyroid
measuring 0.97 x 0.68 x 0.95 cm. There appear to be small punctate
calcifications within the nodule. This is a solid appearing nodule. Clinical
correlation recommended. If clinically warranted, further evaluation with
tissue sampling is recommended considering the calcifications within the
nodule. The right lobe otherwise demonstrates a homogeneous echotexture. The
isthmus demonstrates a homogeneous echotexture. The left lobe of the thyroid
demonstrates a cluster of microcalcifications within the central portion of
the thyroid. There is no associated masses. This is an indeterminate finding
and surveillance evaluation recommended. The left lobe otherwise
demonstrates a homogeneous echotexture.
IMPRESSION: 1. Indeterminate nodule, right lobe of the thyroid as described above.
2. Microcalcifications left lobe. Again also indeterminate finding though
considering the lack of associated masses or nodules may simply represent
dystrophic calcifications. Clinical correlation recommended.

## 2013-06-16 LAB — THYROID STIMULATING IMMUNOGLOBULIN: TSI: 47 %{baseline} (ref <140)

## 2013-06-21 ENCOUNTER — Encounter: Payer: Self-pay | Admitting: Family Medicine

## 2013-06-22 ENCOUNTER — Other Ambulatory Visit: Payer: Federal, State, Local not specified - PPO

## 2013-06-23 ENCOUNTER — Other Ambulatory Visit: Payer: Self-pay | Admitting: Family Medicine

## 2013-06-23 DIAGNOSIS — E0590 Thyrotoxicosis, unspecified without thyrotoxic crisis or storm: Secondary | ICD-10-CM

## 2013-06-23 DIAGNOSIS — E041 Nontoxic single thyroid nodule: Secondary | ICD-10-CM

## 2013-07-08 ENCOUNTER — Encounter: Payer: Self-pay | Admitting: *Deleted

## 2013-07-09 ENCOUNTER — Encounter: Payer: Self-pay | Admitting: *Deleted

## 2013-07-23 ENCOUNTER — Encounter: Payer: Self-pay | Admitting: Internal Medicine

## 2013-07-23 ENCOUNTER — Ambulatory Visit (INDEPENDENT_AMBULATORY_CARE_PROVIDER_SITE_OTHER): Payer: Federal, State, Local not specified - PPO | Admitting: Internal Medicine

## 2013-07-23 VITALS — BP 122/68 | HR 80 | Temp 98.0°F | Resp 12 | Ht 66.5 in | Wt 162.0 lb

## 2013-07-23 DIAGNOSIS — E041 Nontoxic single thyroid nodule: Secondary | ICD-10-CM

## 2013-07-23 DIAGNOSIS — E0590 Thyrotoxicosis, unspecified without thyrotoxic crisis or storm: Secondary | ICD-10-CM

## 2013-07-23 NOTE — Patient Instructions (Signed)
Please stop at the lab. Please come back in 6 months.  Hyperthyroidism The thyroid is a large gland located in the lower front part of your neck. The thyroid helps control metabolism. Metabolism is how your body uses food. It controls metabolism with the hormone thyroxine. When the thyroid is overactive, it produces too much hormone. When this happens, these following problems may occur:   Nervousness  Heat intolerance  Weight loss (in spite of increase food intake)  Diarrhea  Change in hair or skin texture  Palpitations (heart skipping or having extra beats)  Tachycardia (rapid heart rate)  Loss of menstruation (amenorrhea)  Shaking of the hands CAUSES  Grave's Disease (the immune system attacks the thyroid gland). This is the most common cause.  Inflammation of the thyroid gland.  Tumor (usually benign) in the thyroid gland or elsewhere.  Excessive use of thyroid medications (both prescription and 'natural').  Excessive ingestion of Iodine. DIAGNOSIS  To prove hyperthyroidism, your caregiver may do blood tests and ultrasound tests. Sometimes the signs are hidden. It may be necessary for your caregiver to watch this illness with blood tests, either before or after diagnosis and treatment. TREATMENT Short-term treatment There are several treatments to control symptoms. Drugs called beta blockers may give some relief. Drugs that decrease hormone production will provide temporary relief in many people. These measures will usually not give permanent relief. Definitive therapy There are treatments available which can be discussed between you and your caregiver which will permanently treat the problem. These treatments range from surgery (removal of the thyroid), to the use of radioactive iodine (destroys the thyroid by radiation), to the use of antithyroid drugs (interfere with hormone synthesis). The first two treatments are permanent and usually successful. They most often  require hormone replacement therapy for life. This is because it is impossible to remove or destroy the exact amount of thyroid required to make a person euthyroid (normal). HOME CARE INSTRUCTIONS  See your caregiver if the problems you are being treated for get worse. Examples of this would be the problems listed above. SEEK MEDICAL CARE IF: Your general condition worsens. MAKE SURE YOU:   Understand these instructions.  Will watch your condition.  Will get help right away if you are not doing well or get worse. Document Released: 03/12/2005 Document Revised: 06/04/2011 Document Reviewed: 07/24/2006 [facility] Patient Information 2014 [place], [place].

## 2013-07-23 NOTE — Progress Notes (Signed)
Patient ID: Kendra Jackson, female   DOB: 01-May-1960, 53 y.o.   MRN: [id number]   HPI  Kendra Jackson is a very pleasant 53 y.o.-year-old female, referred by her PCP, Dr. Danise Mina, for evaluation for subclinical hyperthyroidism.  She tells me that has been told she had overactive thyroid since 10 years ago. She also has small thyroid nodules, the dominant one (R lobe) was Bx'ed >> benign).  I reviewed pt's thyroid tests back to Jul 27, 2010 (previously seen at [facility]): Lab Results  Component Value Date   TSH 0.13* 06/11/2013   TSH 0.28* 12/10/2012   TSH 0.48 11/29/2011   TSH 0.91 12/06/2010   FREET4 0.67 06/11/2013   FREET4 0.81 12/10/2012    Pt denies feeling nodules in neck, hoarseness, dysphagia/odynophagia, SOB with lying down; she c/o: - + excessive sweating/heat intolerance - ? Hot flushes - no tremors - no anxiety - no palpitations - + fatigue (works nights) - no hyperdefecation, had constipation "all my life" - no weight loss/gain - no hair loss - no dry skin  Pt does not have a FH of thyroid ds. No FH of thyroid cancer. No h/o radiation tx to head or neck.  No seaweed or kelp, no recent contrast studies. No steroid use. No herbal supplements.   ROS: Constitutional: see HPI Eyes: no blurry vision, no xerophthalmia ENT: no sore throat, no nodules palpated in throat, no dysphagia/odynophagia, no hoarseness Cardiovascular: no CP/SOB/palpitations/leg swelling Respiratory: no cough/SOB, + wheezing Gastrointestinal: no N/V/D/C, + heartburn Musculoskeletal: no muscle/joint aches Skin: no rashes Neurological: no tremors/numbness/tingling/dizziness Psychiatric: no depression/anxiety Low libido  Past Medical History  Diagnosis Date  . HTN (hypertension)   . History of colon polyps     hyperplastic polyp Jul 27, 2006, Jul 27, 2011 hemorrhoids, rec rpt 5 yrs  . Enlarged thyroid     h/o abnl labs but never treated  . Sickle cell trait   . 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 Abnormal pigmentation of skin 2002-07-27    L ear antihelix - bx consistent with SK Sharlett Iles), recheck stable per Dr. Kellie Moor  . History of herpes genitalis     valtrex prn  . HLD (hyperlipidemia)   . IBS (irritable bowel syndrome)   . Family history of breast cancer in first degree relative   . Reflux esophagitis     PPI  . Anxiety   . Thyroid nodule 27-Jul-2011    R nodule, FNA biopsy = benign follicular, stable [place] (23/7628 and 12/2012)  . Constipation     severe requiring laxatives, failed miralax  . Subclinical hyperthyroidism 12/22/2012   Past Surgical History  Procedure Laterality Date  . Tvs  11/2008    multiple uterine fibroids  . Colonoscopy  07/2011    hemorrhoids, rpt due 5 yrs given h/o polyps  . Thyroid US  12/2011    1cm solid nodule lower R pole (stable), no significant nodule on left side, rec rpt [place] in 1 yr   History   Social History  . Marital Status: Legally Separated   Occupational History  . Postal clerk [employer]   Social History Main Topics  . Smoking status: Never Smoker   . Smokeless tobacco: Never Used  . Alcohol Use: Yes     Comment: wine occassionally  . Drug Use: No   Social History Narrative   Caffeine: rare   Lives alone, no pets. Grown son , grandson died suddenly at 69 yo 07-26-2008 (drowning) - lots  of stress from this.   Occupation: Tour manager, works third shift   Activity: no regular exercise but bought treadmill   Diet: good water, fruits daily, vegetables occasionally   Current Outpatient Prescriptions on File Prior to Visit  Medication Sig Dispense Refill  . aspirin 81 MG tablet Take 81 mg by mouth daily.      Marland Kitchen atenolol (TENORMIN) 50 MG tablet take 1 tablet by mouth every morning and then take 1 tablet by mouth every evening  60 tablet  6  . hydrochlorothiazide (MICROZIDE) 12.5 MG capsule take 1 capsule by mouth once daily  30 capsule  6  . LORazepam (ATIVAN) 1 MG tablet Take 1 tablet (1 mg total) by  mouth at bedtime as needed for anxiety.  60 tablet  0  . Misc Natural Products (APPLE CIDER VINEGAR) TABS Take by mouth daily.      . Multiple Vitamin (MULTIVITAMIN) tablet Take 1 tablet by mouth daily.      . ranitidine (ZANTAC) 150 MG tablet Take 1 tablet (150 mg total) by mouth at bedtime.      . valACYclovir (VALTREX) 500 MG tablet TAKE 1 TABLET (500 MG TOTAL) BY MOUTH 2 (TWO) TIMES DAILY. FOR 3 DAYS FOR OUTBREAK (6 PILLS TOTAL).  24 tablet  0   No current facility-administered medications on file prior to visit.   Allergies  Allergen Reactions  . Ace Inhibitors   . Naprosyn [Naproxen] Other (See Comments)    abd pain, diarrhea  . Neosporin [Neomycin-Bacitracin Zn-Polymyx] Rash   Family History  Problem Relation Age of Onset  . Heart disease Mother   . Heart disease Father     pig valve  . Coronary artery disease Father   . Cancer Father     prostate  . Cancer Sister     breast  . Diabetes Sister   . Mental illness Sister     schizophrenia  . Cancer Brother     appendix tumor, prostate  . Diabetes Brother   . Mental illness Brother     bipolar  . Cancer Maternal Aunt     bone  . Cancer Maternal Uncle     bladder  . Cancer Maternal Grandmother     colon  . Stroke Maternal Grandfather   . Coronary artery disease Paternal Grandfather   . Cancer Cousin     ovarian, bone   PE: BP 122/68  Pulse 80  Temp(Src) 98 F (36.7 C) (Oral)  Resp 12  Ht 5' 6.5" (1.689 m)  Wt 162 lb (73.483 kg)  BMI 25.76 kg/m2  SpO2 96% Wt Readings from Last 3 Encounters:  07/23/13 162 lb (73.483 kg)  06/11/13 166 lb 12.8 oz (75.66 kg)  12/22/12 167 lb (75.751 kg)   Constitutional: normal weight, in NAD Eyes: PERRLA, EOMI, no exophthalmos, no lid lag, no stare ENT: moist mucous membranes, no thyromegaly, no thyroid bruits, no cervical lymphadenopathy Cardiovascular: RRR, No MRG Respiratory: CTA B Gastrointestinal: abdomen soft, NT, ND, BS+ Musculoskeletal: no deformities, strength  intact in all 4 Skin: moist, warm, no rashes Neurological: no tremor with outstretched hands, DTR normal in all 4  ASSESSMENT: 1. Subclinical hyperthyroidism - TSI 47 (<140%)  2. H/o thyroid nodules - 07/10/2011 Thyroid nodule biopsy - benign follicular nodule  - 29/52/8413 Thyroid US  - stable - 12/31/2012 Thyroid U/S:  Right thyroid lobe: 53 x 12 x 17 mm. Homogeneous background parenchyma. 7 x 9 x 10 mm solid with calcifications, inferior right (previously 7  x 8 x 10)   Left thyroid lobe: 53 x 11 x 16 mm. Homogeneous background echotexture. At least 6 additional small bilateral nodules, 4 mm or less diameter.   Isthmus: 2.9 mm. No nodules visualized.   Lymphadenopathy None visualized. - may need a thyroid Uptake and scan   PLAN:  1. Patient with a h/o low TSH (subclinical hyperthyroidism), without thyrotoxic sxs - she does not appear to have exogenous causes for the low TSH.  - We discussed that possible causes of thyrotoxicosis are:  Graves ds, unlikely with low TSI level) Thyroiditis, unlikely due to long evolution (years) toxic multinodular goiter/ toxic adenoma - I suggested that we check the TSH, fT3 and fT4 again >>  If labs worse >> check Thyroid Uptake and Scan to differentiate between the 3 above possible etiologies   If labs stable or better >> we will continue to monitor them. - I advised her to join my chart to communicate easier >> refuses (no internet at home) - RTC in 6 months, possibly sooner for repeat labs  Office Visit on 07/23/2013  Component Date Value Ref Range Status  . TSH 07/23/2013 0.19* 0.35 - 5.50 uIU/mL Final  . Free T4 07/23/2013 0.77  0.60 - 1.60 ng/dL Final  . T3, Free 07/23/2013 2.9  2.3 - 4.2 pg/mL Final   TFTs a little better than before >> we can just watch the tests for now >> will recheck when she comes back in 6 mo.

## 2013-07-24 LAB — TSH: TSH: 0.19 u[IU]/mL — ABNORMAL LOW (ref 0.35–5.50)

## 2013-07-24 LAB — T3, FREE: T3, Free: 2.9 pg/mL (ref 2.3–4.2)

## 2013-07-24 LAB — T4, FREE: FREE T4: 0.77 ng/dL (ref 0.60–1.60)

## 2013-09-19 ENCOUNTER — Other Ambulatory Visit: Payer: Self-pay | Admitting: Family Medicine

## 2013-10-02 ENCOUNTER — Ambulatory Visit (INDEPENDENT_AMBULATORY_CARE_PROVIDER_SITE_OTHER): Payer: Federal, State, Local not specified - PPO | Admitting: Family Medicine

## 2013-10-02 ENCOUNTER — Encounter: Payer: Self-pay | Admitting: Family Medicine

## 2013-10-02 VITALS — BP 136/86 | HR 72 | Temp 98.5°F | Wt 164.5 lb

## 2013-10-02 DIAGNOSIS — H00019 Hordeolum externum unspecified eye, unspecified eyelid: Secondary | ICD-10-CM | POA: Insufficient documentation

## 2013-10-02 DIAGNOSIS — H00013 Hordeolum externum right eye, unspecified eyelid: Secondary | ICD-10-CM

## 2013-10-02 DIAGNOSIS — N644 Mastodynia: Secondary | ICD-10-CM

## 2013-10-02 MED ORDER — LORAZEPAM 1 MG PO TABS: 1.0000 mg | ORAL_TABLET | Freq: Every evening | ORAL | Status: DC | PRN

## 2013-10-02 MED ORDER — ERYTHROMYCIN 5 MG/GM OP OINT: 1.0000 "application " | TOPICAL_OINTMENT | Freq: Three times a day (TID) | OPHTHALMIC | Status: DC

## 2013-10-02 NOTE — Addendum Note (Signed)
Addended by: Ria Bush on: 10/02/2013 10:41 AM   Modules accepted: Orders

## 2013-10-02 NOTE — Progress Notes (Signed)
BP 136/86  Pulse 72  Temp(Src) 98.5 F (36.9 C) (Oral)  Wt 164 lb 8 oz (74.617 kg)   CC: several issues  Subjective:    Patient ID: Kendra Jackson, female    DOB: 1960-04-21, 53 y.o.   MRN: [id number]  HPI: Kendra Jackson is a 53 y.o. female presenting on 10/02/2013 for Conjunctivitis and Breast Pain   New 2wk old grandchild, brings picture book.  "I think i have the pink eye" 3d h/o mild malaise with rhinorrhea, mild headache, diarrhea, sweats, lesion on tongue, then eye started hurting. Then started noticing mild discharge and crusting in am. No cough, no congestion. So far has been using vaseline eye drops. No vision changes, no pain with eye movements.  Mildly red eye. No photophobia.  L breast pain - painful on and off for years, very mild. Denies inciting trauma. No right side discomfort. Occasionally feels pulling sensation when raising arm. No nipple discharge. No skin changes. Mammogram 01/2013 WNL Birads 1. Sister with h/o breast cancer age 42, niece with bilateral breast cancer 65.  Tdap 11/2011.  Relevant past medical, surgical, family and social history reviewed and updated as indicated.  Allergies and medications reviewed and updated. Current Outpatient Prescriptions on File Prior to Visit  Medication Sig  . aspirin 81 MG tablet Take 81 mg by mouth daily.  Marland Kitchen atenolol (TENORMIN) 50 MG tablet take 1 tablet by mouth every morning and then take 1 tablet by mouth every evening  . hydrochlorothiazide (MICROZIDE) 12.5 MG capsule take 1 capsule by mouth once daily  . LORazepam (ATIVAN) 1 MG tablet Take 1 tablet (1 mg total) by mouth at bedtime as needed for anxiety.  . Misc Natural Products (APPLE CIDER VINEGAR) TABS Take by mouth daily.  . Multiple Vitamin (MULTIVITAMIN) tablet Take 1 tablet by mouth daily.  Marland Kitchen omeprazole (PRILOSEC) 40 MG capsule take 1 capsule by mouth once daily  . ranitidine (ZANTAC) 150 MG tablet Take 1 tablet (150 mg total) by mouth at bedtime.  .  valACYclovir (VALTREX) 500 MG tablet TAKE 1 TABLET (500 MG TOTAL) BY MOUTH 2 (TWO) TIMES DAILY. FOR 3 DAYS FOR OUTBREAK (6 PILLS TOTAL).   No current facility-administered medications on file prior to visit.    Review of Systems Per HPI unless specifically indicated above    Objective:    BP 136/86  Pulse 72  Temp(Src) 98.5 F (36.9 C) (Oral)  Wt 164 lb 8 oz (74.617 kg)  Physical Exam  Nursing note and vitals reviewed. Constitutional: She appears well-developed and well-nourished. No distress.  HENT:  Head: Normocephalic and atraumatic.  Mouth/Throat: Oropharynx is clear and moist. No oropharyngeal exudate.  Eyes: EOM are normal. Pupils are equal, round, and reactive to light. Right eye exhibits discharge and hordeolum (internal lower eyelid). Left eye exhibits no discharge and no hordeolum. Right conjunctiva is injected (slight). Left conjunctiva is not injected. No scleral icterus.  Pulmonary/Chest: Right breast exhibits inverted nipple. Right breast exhibits no mass, no nipple discharge, no skin change and no tenderness. Left breast exhibits tenderness. Left breast exhibits no inverted nipple, no mass, no nipple discharge and no skin change. Breasts are symmetrical.  Slight lumpy conformation of left breast, tender around areola and just medial to this No discrete masses or LAD noted.  Lymphadenopathy:    She has no axillary adenopathy.       Right axillary: No pectoral and no lateral adenopathy present.       Left axillary: No  pectoral and no lateral adenopathy present.      Right: No supraclavicular adenopathy present.       Left: No supraclavicular adenopathy present.   Results for orders placed in visit on 07/23/13  TSH      Result Value Ref Range   TSH 0.19 (*) 0.35 - 5.50 uIU/mL  T4, FREE      Result Value Ref Range   Free T4 0.77  0.60 - 1.60 ng/dL  T3, FREE      Result Value Ref Range   T3, Free 2.9  2.3 - 4.2 pg/mL      Assessment & Plan:   Problem List Items  Addressed This Visit   Stye     R internal stye leading to irritant conjunctivitis - treat with warm compresses and romycin ophthalmic ointment. Update if not improving as expected.    Relevant Medications      ROMYCIN 5 MG/GM OP OINT   Breast pain, left - Primary     Exam WNL today except for noted discomfort to palpation.  Given strong fmhx breast cancer, will check dx L breast mammo and [place] if needed. ?fibrocystic breast disease - discussed with patient. Latest mammo without evidence of this.    Relevant Orders      MM Digital Diagnostic Unilat L      US BREAST COMPLETE UNI LEFT INC AXILLA       Follow up plan: Return if symptoms worsen or fail to improve.

## 2013-10-02 NOTE — Assessment & Plan Note (Signed)
Exam WNL today except for noted discomfort to palpation.  Given strong fmhx breast cancer, will check dx L breast mammo and [place] if needed. ?fibrocystic breast disease - discussed with patient. Latest mammo without evidence of this.

## 2013-10-02 NOTE — Assessment & Plan Note (Signed)
R internal stye leading to irritant conjunctivitis - treat with warm compresses and romycin ophthalmic ointment. Update if not improving as expected.

## 2013-10-02 NOTE — Patient Instructions (Signed)
For left breast pain - exam overall ok. This could just be fibrocystic breast disease but let's check left mammogram with ultrasound if needed - pass by Linda's office to schedule this. For right eye - you have stye. Treat with erythromycin ointment and warm compresses. Let us know if not improving as expected. Good to see you today, call us with questions You are up to date on pertussis (whooping cough) shot.

## 2013-10-02 NOTE — Progress Notes (Signed)
Pre visit review using our clinic review tool, if applicable. No additional management support is needed unless otherwise documented below in the visit note. 

## 2013-10-06 ENCOUNTER — Telehealth: Payer: Self-pay | Admitting: Family Medicine

## 2013-10-06 NOTE — Telephone Encounter (Signed)
Patient Information:  Caller Name: Jaymee  Phone: [phone]  Patient: Kendra Jackson  Gender: Female  DOB: 1960/11/08  Age: 53 Years  PCP: Ria Bush [facility])  Pregnant: No  Office Follow Up:  Does the office need to follow up with this patient?: No  Instructions For The Office: N/A  RN Note: Used ointment on Saturday and woke up Sunday with drainage, swelling noted.   Patient states that she felt she may have had a reaction to the ointment and has not used since due to significant drainage and swelling of eye.  Patient spoke with Pharmacist and they states that it could have been a reaction to the medication.  Currently has mild swelling to right side of face, right eye with large red bump the size of a pea.  Moderate tenderness to right neck gland with pea sized swelling noted to gland.  Patient was offered appointment for today and declined stating that she wanted to see Dr. Danise Mina and has appointment for tomorrow around 11:00.  Encouraged to follow instructions and use warm compresses to the eye.  Triaged using Eye: Infection or Irritation guideline with disposition of see in 24 hours for "Symptoms worsening, not improved, or have returned".  Symptoms  Reason For Call & Symptoms: Sty to right eye  Reviewed Health History In EMR: Yes  Reviewed Medications In EMR: Yes  Reviewed Allergies In EMR: Yes  Reviewed Surgeries / Procedures: Yes  Date of Onset of Symptoms: 10/02/2013  Treatments Tried: Erythromycin  Treatments Tried Worked: No OB / GYN:  LMP: Unknown  Guideline(s) Used:  No Protocol Available - Sick Adult  Disposition Per Guideline:   See Today or Tomorrow in Office  Reason For Disposition Reached:   Nursing judgment  Advice Given:  Call Back If:  New symptoms develop  You become worse.  Patient Will Follow Care Advice:  YES

## 2013-10-06 NOTE — Telephone Encounter (Signed)
Noted. Will see tomorrow. Allergy/med list updated.

## 2013-10-07 ENCOUNTER — Ambulatory Visit (INDEPENDENT_AMBULATORY_CARE_PROVIDER_SITE_OTHER): Payer: Federal, State, Local not specified - PPO | Admitting: Family Medicine

## 2013-10-07 ENCOUNTER — Encounter: Payer: Self-pay | Admitting: Family Medicine

## 2013-10-07 ENCOUNTER — Encounter: Payer: Self-pay | Admitting: *Deleted

## 2013-10-07 VITALS — BP 130/80 | HR 64 | Temp 98.4°F | Wt 163.5 lb

## 2013-10-07 DIAGNOSIS — H00019 Hordeolum externum unspecified eye, unspecified eyelid: Secondary | ICD-10-CM

## 2013-10-07 DIAGNOSIS — H00013 Hordeolum externum right eye, unspecified eyelid: Secondary | ICD-10-CM

## 2013-10-07 NOTE — Assessment & Plan Note (Signed)
Progressing R sty. Anticipate erythromycin reaction - now off this med. Encouraged continue lubricating eye drops and warm compresses. Discussed anticipate course of resolution, advised to notify me if persistent sxs for referral to ophtho. No evidence of cellulitis today - advised to notify if spreading redness or fever or worsening periorbital edema for consideration of abx course (clinda)

## 2013-10-07 NOTE — Progress Notes (Signed)
Pre visit review using our clinic review tool, if applicable. No additional management support is needed unless otherwise documented below in the visit note. 

## 2013-10-07 NOTE — Progress Notes (Signed)
   BP 130/80  Pulse 64  Temp(Src) 98.4 F (36.9 C) (Oral)  Wt 163 lb 8 oz (74.163 kg)   CC: eye reaction  Subjective:    Patient ID: Kendra Jackson, female    DOB: 1960-10-03, 53 y.o.   MRN: [id number]  HPI: Kendra Jackson is a 53 y.o. female presenting on 10/07/2013 for Follow-up   See prior note for details. At last visit, dx with R lower eyelid stye, treated with warm compresses and romycin eye ointment. Worried romycin caused reaction with swelling and discharge of eye.  Has been using lubricating eye drops and warm compresses R neck gland swollen  No fevers/chills, no more congestion, rhinorrhea.  Requests note excuse from work for yesterday and today  Relevant past medical, surgical, family and social history reviewed and updated as indicated.  Allergies and medications reviewed and updated. Current Outpatient Prescriptions on File Prior to Visit  Medication Sig  . aspirin 81 MG tablet Take 81 mg by mouth daily.  Marland Kitchen atenolol (TENORMIN) 50 MG tablet take 1 tablet by mouth every morning and then take 1 tablet by mouth every evening  . hydrochlorothiazide (MICROZIDE) 12.5 MG capsule take 1 capsule by mouth once daily  . LORazepam (ATIVAN) 1 MG tablet Take 1 tablet (1 mg total) by mouth at bedtime as needed for anxiety.  . Multiple Vitamin (MULTIVITAMIN) tablet Take 1 tablet by mouth daily.  Marland Kitchen omeprazole (PRILOSEC) 40 MG capsule take 1 capsule by mouth once daily  . ranitidine (ZANTAC) 150 MG tablet Take 1 tablet (150 mg total) by mouth at bedtime.  . valACYclovir (VALTREX) 500 MG tablet TAKE 1 TABLET (500 MG TOTAL) BY MOUTH 2 (TWO) TIMES DAILY. FOR 3 DAYS FOR OUTBREAK (6 PILLS TOTAL).  . Misc Natural Products (APPLE CIDER VINEGAR) TABS Take by mouth daily.   No current facility-administered medications on file prior to visit.    Review of Systems Per HPI unless specifically indicated above    Objective:    BP 130/80  Pulse 64  Temp(Src) 98.4 F (36.9 C) (Oral)  Wt  163 lb 8 oz (74.163 kg)  Physical Exam  Nursing note and vitals reviewed. Constitutional: She appears well-developed and well-nourished. No distress.  HENT:  Mouth/Throat: Oropharynx is clear and moist. No oropharyngeal exudate.  Eyes: Conjunctivae and EOM are normal. Pupils are equal, round, and reactive to light. Right eye exhibits hordeolum (internal ). Right eye exhibits no discharge. Left eye exhibits no discharge and no hordeolum. Right conjunctiva is not injected. Left conjunctiva is not injected. No scleral icterus.    Lymphadenopathy:    She has cervical adenopathy (R submandibular LAD).       Assessment & Plan:   Problem List Items Addressed This Visit   Stye - Primary     Progressing R sty. Anticipate erythromycin reaction - now off this med. Encouraged continue lubricating eye drops and warm compresses. Discussed anticipate course of resolution, advised to notify me if persistent sxs for referral to ophtho. No evidence of cellulitis today - advised to notify if spreading redness or fever or worsening periorbital edema for consideration of abx course (clinda)        Follow up plan: Return if symptoms worsen or fail to improve.

## 2013-10-07 NOTE — Patient Instructions (Signed)
You have developed stye. Treat with warm compresses for 15 min at a time 4 times a day. Watch for spreading redness or not improving as expected. If not improving in next week, call me for referral to eye doctor. Letter out of work provided today.  Sty A sty (hordeolum) is an infection of a gland in the eyelid located at the base of the eyelash. A sty may develop a white or yellow head of pus. It can be puffy (swollen). Usually, the sty will burst and pus will come out on its own. They do not leave lumps in the eyelid once they drain. A sty is often confused with another form of cyst of the eyelid called a chalazion. Chalazions occur within the eyelid and not on the edge where the bases of the eyelashes are. They often are red, sore and then form firm lumps in the eyelid. CAUSES   Germs (bacteria).  Lasting (chronic) eyelid inflammation. SYMPTOMS   Tenderness, redness and swelling along the edge of the eyelid at the base of the eyelashes.  Sometimes, there is a white or yellow head of pus. It may or may not drain. DIAGNOSIS  An ophthalmologist will be able to distinguish between a sty and a chalazion and treat the condition appropriately.  TREATMENT   Styes are typically treated with warm packs (compresses) until drainage occurs.  In rare cases, medicines that kill germs (antibiotics) may be prescribed. These antibiotics may be in the form of drops, cream or pills.  If a hard lump has formed, it is generally necessary to do a small incision and remove the hardened contents of the cyst in a minor surgical procedure done in the office.  In suspicious cases, your caregiver may send the contents of the cyst to the lab to be certain that it is not a rare, but dangerous form of cancer of the glands of the eyelid. HOME CARE INSTRUCTIONS   Wash your hands often and dry them with a clean towel. Avoid touching your eyelid. This may spread the infection to other parts of the eye.  Apply heat  to your eyelid for 10 to 20 minutes, several times a day, to ease pain and help to heal it faster.  Do not squeeze the sty. Allow it to drain on its own. Wash your eyelid carefully 3 to 4 times per day to remove any pus. SEEK IMMEDIATE MEDICAL CARE IF:   Your eye becomes painful or puffy (swollen).  Your vision changes.  Your sty does not drain by itself within 3 days.  Your sty comes back within a short period of time, even with treatment.  You have redness (inflammation) around the eye.  You have a fever. Document Released: 12/20/2004 Document Revised: 06/04/2011 Document Reviewed: 08/24/2008 [facility] Patient Information 2015 [place], [place]. This information is not intended to replace advice given to you by your health care provider. Make sure you discuss any questions you have with your health care provider.

## 2013-10-11 ENCOUNTER — Other Ambulatory Visit: Payer: Self-pay | Admitting: Family Medicine

## 2013-10-26 ENCOUNTER — Telehealth: Payer: Self-pay | Admitting: Family Medicine

## 2013-10-26 NOTE — Telephone Encounter (Signed)
Called the patient to schedule the Breast US and she said she doesn't want to have the [place] right now and now wants to wait until October for her yearly mammogram. Please cancel the [place] order in Epic.

## 2013-11-02 NOTE — Telephone Encounter (Signed)
will cancel dx mammo/us

## 2013-12-16 ENCOUNTER — Other Ambulatory Visit: Payer: Self-pay | Admitting: Family Medicine

## 2013-12-16 DIAGNOSIS — E0590 Thyrotoxicosis, unspecified without thyrotoxic crisis or storm: Secondary | ICD-10-CM

## 2013-12-16 DIAGNOSIS — I10 Essential (primary) hypertension: Secondary | ICD-10-CM

## 2013-12-16 DIAGNOSIS — E785 Hyperlipidemia, unspecified: Secondary | ICD-10-CM

## 2013-12-18 ENCOUNTER — Encounter: Payer: Self-pay | Admitting: Radiology

## 2013-12-18 ENCOUNTER — Other Ambulatory Visit (INDEPENDENT_AMBULATORY_CARE_PROVIDER_SITE_OTHER): Payer: Federal, State, Local not specified - PPO

## 2013-12-18 DIAGNOSIS — I10 Essential (primary) hypertension: Secondary | ICD-10-CM

## 2013-12-18 DIAGNOSIS — Z0000 Encounter for general adult medical examination without abnormal findings: Secondary | ICD-10-CM

## 2013-12-18 DIAGNOSIS — E041 Nontoxic single thyroid nodule: Secondary | ICD-10-CM

## 2013-12-18 DIAGNOSIS — E785 Hyperlipidemia, unspecified: Secondary | ICD-10-CM

## 2013-12-18 DIAGNOSIS — E0590 Thyrotoxicosis, unspecified without thyrotoxic crisis or storm: Secondary | ICD-10-CM

## 2013-12-18 LAB — LIPID PANEL
Cholesterol: 270 mg/dL — ABNORMAL HIGH (ref 0–200)
HDL: 91.7 mg/dL (ref >39.00)
LDL Cholesterol: 162 mg/dL — ABNORMAL HIGH (ref 0–99)
NonHDL: 178.3
Total CHOL/HDL Ratio: 3
Triglycerides: 82 mg/dL (ref 0.0–149.0)
VLDL: 16.4 mg/dL (ref 0.0–40.0)

## 2013-12-18 LAB — BASIC METABOLIC PANEL
BUN: 18 mg/dL (ref 6–23)
CO2: 26 meq/L (ref 19–32)
CREATININE: 0.9 mg/dL (ref 0.4–1.2)
Calcium: 9.5 mg/dL (ref 8.4–10.5)
Chloride: 103 mEq/L (ref 96–112)
GFR: 88.06 mL/min (ref >60.00)
Glucose, Bld: 89 mg/dL (ref 70–99)
Potassium: 3.5 mEq/L (ref 3.5–5.1)
Sodium: 138 mEq/L (ref 135–145)

## 2013-12-18 LAB — TSH: TSH: 0.25 u[IU]/mL — ABNORMAL LOW (ref 0.35–4.50)

## 2013-12-18 LAB — T4, FREE: Free T4: 0.8 ng/dL (ref 0.60–1.60)

## 2013-12-18 LAB — T3: T3 TOTAL: 102.4 ng/dL (ref 80.0–204.0)

## 2013-12-24 ENCOUNTER — Encounter: Payer: Self-pay | Admitting: Internal Medicine

## 2013-12-24 ENCOUNTER — Ambulatory Visit (INDEPENDENT_AMBULATORY_CARE_PROVIDER_SITE_OTHER): Payer: Federal, State, Local not specified - PPO | Admitting: Internal Medicine

## 2013-12-24 VITALS — BP 118/68 | HR 67 | Temp 98.6°F | Resp 12 | Wt 166.0 lb

## 2013-12-24 DIAGNOSIS — E0590 Thyrotoxicosis, unspecified without thyrotoxic crisis or storm: Secondary | ICD-10-CM

## 2013-12-24 DIAGNOSIS — E041 Nontoxic single thyroid nodule: Secondary | ICD-10-CM

## 2013-12-24 NOTE — Patient Instructions (Signed)
Please come back for labs in 4 months and for a visit in 1 year.

## 2013-12-24 NOTE — Progress Notes (Signed)
Patient ID: Kendra Jackson, female   DOB: 10/06/1960, 53 y.o.   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  Her mom died this summer and she has a new grandbaby. 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  Reviewed hx: Pt was told she had overactive thyroid ~10 years ago. She also has small thyroid nodules, the dominant one (R lobe) was Bx'ed >> benign).  I reviewed pt's thyroid tests back to 2012 (previously seen at [facility]): Lab Results  Component Value Date   TSH 0.25* 12/18/2013   TSH 0.19* 07/23/2013   TSH 0.13* 06/11/2013   TSH 0.28* 12/10/2012   TSH 0.48 11/29/2011   TSH 0.91 12/06/2010   FREET4 0.80 12/18/2013   FREET4 0.77 07/23/2013   FREET4 0.67 06/11/2013   FREET4 0.81 12/10/2012    Pt denies feeling nodules in neck, hoarseness, dysphagia/odynophagia, SOB with lying down; she c/o: - + excessive sweating/heat intolerance - ? Hot flushes - no tremors - no anxiety - no palpitations - + fatigue (works nights) - no hyperdefecation, had constipation "all my life" - no weight loss/gain - no hair loss - no dry skin  I reviewed pt's medications, allergies, PMH, social hx, family hx and no changes required, except as mentioned above.  ROS: Constitutional: see HPI Eyes: no blurry vision, no xerophthalmia ENT: no sore throat, no nodules palpated in throat, no dysphagia/odynophagia, no hoarseness Cardiovascular: no CP/SOB/palpitations/leg swelling Respiratory: no cough/SOB/wheezing Gastrointestinal: no N/V/D/C/heartburn Musculoskeletal: no muscle/joint aches, + pain in L breast (will have appt with PCP tomorrow) Skin: no rashes Neurological: no tremors/numbness/tingling/dizziness  PE: BP 118/68  Pulse 67  Temp(Src) 98.6 F (37 C) (Oral)  Resp 12  Wt 166 lb (75.297 kg)  SpO2 98% Wt Readings from Last 3 Encounters:  12/24/13 166 lb  (75.297 kg)  10/07/13 163 lb 8 oz (74.163 kg)  10/02/13 164 lb 8 oz (74.617 kg)   Constitutional: normal weight, in NAD Eyes: PERRLA, EOMI, no exophthalmos, no lid lag, no stare ENT: moist mucous membranes, no thyromegaly, no thyroid bruits, no cervical lymphadenopathy Cardiovascular: RRR, No MRG Respiratory: CTA B Gastrointestinal: abdomen soft, NT, ND, BS+ Musculoskeletal: no deformities, strength intact in all 4 Skin: moist, warm, no rashes Neurological: no tremor with outstretched hands, DTR normal in all 4  ASSESSMENT: 1. 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 Homogeneous background parenchyma. 7 x 9 x 10 mm solid with calcifications, inferior right (previously 7 x 8 x 10)   Left thyroid lobe: 53 x 11 x 16 mm. Homogeneous background echotexture. At least 6 additional small bilateral nodules, 4 mm or less diameter.   Isthmus: 2.9 mm. No nodules visualized.   Lymphadenopathy None visualized. - may need a thyroid Uptake and scan   PLAN:  1. Patient with a h/o low TSH (subclinical hyperthyroidism), without thyrotoxic sxs. 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 - I advised her to join my chart to communicate easier >> refuses (no internet at home) - RTC in 12 mo for a visit and in 4 mo for labs  2. MNG - stable (reviewed U/S reports - no neck compression sxs - last U/S in 12/2013 >> will repeat in 1 year

## 2013-12-25 ENCOUNTER — Ambulatory Visit (INDEPENDENT_AMBULATORY_CARE_PROVIDER_SITE_OTHER): Payer: Federal, State, Local not specified - PPO | Admitting: Family Medicine

## 2013-12-25 ENCOUNTER — Encounter: Payer: Self-pay | Admitting: Family Medicine

## 2013-12-25 VITALS — BP 144/82 | HR 74 | Temp 99.0°F | Wt 166.5 lb

## 2013-12-25 DIAGNOSIS — E0590 Thyrotoxicosis, unspecified without thyrotoxic crisis or storm: Secondary | ICD-10-CM

## 2013-12-25 DIAGNOSIS — Z0000 Encounter for general adult medical examination without abnormal findings: Secondary | ICD-10-CM

## 2013-12-25 DIAGNOSIS — K2100 Gastro-esophageal reflux disease with esophagitis, without bleeding: Secondary | ICD-10-CM

## 2013-12-25 DIAGNOSIS — E785 Hyperlipidemia, unspecified: Secondary | ICD-10-CM

## 2013-12-25 DIAGNOSIS — I10 Essential (primary) hypertension: Secondary | ICD-10-CM

## 2013-12-25 DIAGNOSIS — Z23 Encounter for immunization: Secondary | ICD-10-CM

## 2013-12-25 NOTE — Assessment & Plan Note (Signed)
Followed by endo.  

## 2013-12-25 NOTE — Assessment & Plan Note (Signed)
Preventative protocols reviewed and updated unless pt declined. Discussed healthy diet and lifestyle.  

## 2013-12-25 NOTE — Assessment & Plan Note (Signed)
Continue omeprazole prn

## 2013-12-25 NOTE — Progress Notes (Signed)
Pre visit review using our clinic review tool, if applicable. No additional management support is needed unless otherwise documented below in the visit note. 

## 2013-12-25 NOTE — Progress Notes (Signed)
BP 144/82  Pulse 74  Temp(Src) 99 F (37.2 C) (Oral)  Wt 166 lb 8 oz (75.524 kg)  SpO2 99%   CC: CPE  Subjective:    Patient ID: Kendra Jackson, female    DOB: July 20, 1960, 53 y.o.   MRN: [id number]  HPI: Kendra Jackson is a 53 y.o. female presenting on 12/25/2013 for Annual Exam   Recent eval by endo for subclinical hyperthyroidism - stable TFTs. rec rpt labwork 4 mo, f/u office visit 1 yr.  Cares for 3yo baby.  Preventative: Well woman 2013 - pap smear - remote h/o abnormal cells s/p cryotherapy. Next due 2016.  LMP - 04/12/2009, no bleeding since then.  Mammogram - 01/2013 normal, will schedule rpt. Persistent L breast pain. Colonoscopy 07/2011 - hemorrhoids, rpt 5 yrs given h/o polyps ([place]).  Tetanus - Tdap 2013  Flu shot - today   Caffeine: rare  Lives alone, no pets. Grown son 69yo, grandson died suddenly at 1yo 2010 (drowning) - lots of stress from this.  Occupation: Tour manager, works third shift  Activity: no regular exercise but bought treadmill  Diet: good water, fruits daily, vegetables occasionally   Relevant past medical, surgical, family and social history reviewed and updated as indicated.  Allergies and medications reviewed and updated. Current Outpatient Prescriptions on File Prior to Visit  Medication Sig  . aspirin 81 MG tablet Take 81 mg by mouth daily.  Marland Kitchen atenolol (TENORMIN) 50 MG tablet take 1 tablet by mouth every morning and take 1 every evening  . hydrochlorothiazide (MICROZIDE) 12.5 MG capsule take 1 capsule by mouth once daily  . LORazepam (ATIVAN) 1 MG tablet Take 1 tablet (1 mg total) by mouth at bedtime as needed for anxiety.  . Misc Natural Products (APPLE CIDER VINEGAR) TABS Take by mouth daily.  . Multiple Vitamin (MULTIVITAMIN) tablet Take 1 tablet by mouth daily.  Marland Kitchen omeprazole (PRILOSEC) 40 MG capsule take 1 capsule by mouth once daily  . valACYclovir (VALTREX) 500 MG tablet TAKE 1 TABLET (500 MG TOTAL) BY MOUTH 2 (TWO) TIMES  DAILY. FOR 3 DAYS FOR OUTBREAK (6 PILLS TOTAL).   No current facility-administered medications on file prior to visit.    Review of Systems  Constitutional: Negative for fever, chills, activity change, appetite change, fatigue and unexpected weight change.  HENT: Negative for hearing loss.   Eyes: Negative for visual disturbance.  Respiratory: Negative for cough, chest tightness, shortness of breath and wheezing.   Cardiovascular: Negative for chest pain, palpitations and leg swelling.  Gastrointestinal: Positive for constipation. Negative for nausea, vomiting, abdominal pain, diarrhea, blood in stool and abdominal distention.  Endocrine: Positive for cold intolerance and heat intolerance.  Genitourinary: Negative for hematuria and difficulty urinating.  Musculoskeletal: Negative for arthralgias, myalgias and neck pain.  Skin: Negative for rash.  Neurological: Negative for dizziness, seizures, syncope and headaches.  Hematological: Negative for adenopathy. Does not bruise/bleed easily.  Psychiatric/Behavioral: Negative for dysphoric mood. The patient is not nervous/anxious.    Per HPI unless specifically indicated above    Objective:    BP 144/82  Pulse 74  Temp(Src) 99 F (37.2 C) (Oral)  Wt 166 lb 8 oz (75.524 kg)  SpO2 99%  Physical Exam  Nursing note and vitals reviewed. Constitutional: She is oriented to person, place, and time. She appears well-developed and well-nourished. No distress.  HENT:  Head: Normocephalic and atraumatic.  Right Ear: Hearing, tympanic membrane, external ear and ear canal normal.  Left Ear: Hearing, tympanic  membrane, external ear and ear canal normal.  Nose: Nose normal.  Mouth/Throat: Uvula is midline, oropharynx is clear and moist and mucous membranes are normal. No oropharyngeal exudate, posterior oropharyngeal edema or posterior oropharyngeal erythema.  Eyes: Conjunctivae and EOM are normal. Pupils are equal, round, and reactive to light. No  scleral icterus.  Neck: Normal range of motion. Neck supple. No thyromegaly present.  Cardiovascular: Normal rate, regular rhythm, normal heart sounds and intact distal pulses.   No murmur heard. Pulses:      Radial pulses are 2+ on the right side, and 2+ on the left side.  Pulmonary/Chest: Effort normal and breath sounds normal. No respiratory distress. She has no wheezes. She has no rales. Right breast exhibits no inverted nipple, no mass, no nipple discharge, no skin change and no tenderness. Left breast exhibits no inverted nipple, no mass, no nipple discharge, no skin change and no tenderness.  Abdominal: Soft. Bowel sounds are normal. She exhibits no distension and no mass. There is no tenderness. There is no rebound and no guarding.  Musculoskeletal: Normal range of motion. She exhibits no edema.  Lymphadenopathy:       Head (right side): No submental, no submandibular, no tonsillar, no preauricular and no posterior auricular adenopathy present.       Head (left side): No submental, no submandibular, no tonsillar, no preauricular and no posterior auricular adenopathy present.    She has no cervical adenopathy.    She has no axillary adenopathy.       Right axillary: No lateral adenopathy present.       Left axillary: No lateral adenopathy present.      Right: No supraclavicular adenopathy present.       Left: No supraclavicular adenopathy present.  Neurological: She is alert and oriented to person, place, and time.  CN grossly intact, station and gait intact  Skin: Skin is warm and dry. No rash noted.  Psychiatric: She has a normal mood and affect. Her behavior is normal. Judgment and thought content normal.   Results for orders placed in visit on 12/18/13  LIPID PANEL      Result Value Ref Range   Cholesterol 270 (*) 0 - 200 mg/dL   Triglycerides 82.0  0.0 - 149.0 mg/dL   HDL 91.70  >39.00 mg/dL   VLDL 16.4  0.0 - 40.0 mg/dL   LDL Cholesterol 162 (*) 0 - 99 mg/dL   Total  CHOL/HDL Ratio 3     NonHDL 798.92    BASIC METABOLIC PANEL      Result Value Ref Range   Sodium 138  135 - 145 mEq/L   Potassium 3.5  3.5 - 5.1 mEq/L   Chloride 103  96 - 112 mEq/L   CO2 26  19 - 32 mEq/L   Glucose, Bld 89  70 - 99 mg/dL   BUN 18  6 - 23 mg/dL   Creatinine, Ser 0.9  0.4 - 1.2 mg/dL   Calcium 9.5  8.4 - 10.5 mg/dL   GFR 88.06  >60.00 mL/min  T4, FREE      Result Value Ref Range   Free T4 0.80  0.60 - 1.60 ng/dL  TSH      Result Value Ref Range   TSH 0.25 (*) 0.35 - 4.50 uIU/mL  T3      Result Value Ref Range   T3, Total 102.4  80.0 - 204.0 ng/dL      Assessment & Plan:   Problem List Items  Addressed This Visit   Subclinical hyperthyroidism     Followed by endo    Reflux esophagitis     Continue omeprazole prn.    HTN (hypertension)     Chronic, stable. Continue regimen.    HLD (hyperlipidemia)     Chronic, LDL and TC elevated persistently. +fmhx. Discussed dietary changes to help lower LDL.    Healthcare maintenance - Primary     Preventative protocols reviewed and updated unless pt declined. Discussed healthy diet and lifestyle.      Other Visit Diagnoses   Need for influenza vaccination        Relevant Orders       Flu Vaccine QUAD 36+ mos PF IM (Fluarix Quad PF) (Completed)        Follow up plan: No Follow-up on file.

## 2013-12-25 NOTE — Assessment & Plan Note (Signed)
Chronic, LDL and TC elevated persistently. +fmhx. Discussed dietary changes to help lower LDL.

## 2013-12-25 NOTE — Assessment & Plan Note (Signed)
Chronic, stable. Continue regimen. 

## 2013-12-25 NOTE — Patient Instructions (Signed)
Flu shot today. Good to see you today, call us with questions. Return as needed or in 1 year for next physical

## 2013-12-28 ENCOUNTER — Telehealth: Payer: Self-pay | Admitting: Family Medicine

## 2013-12-28 NOTE — Telephone Encounter (Signed)
emmi emailed °

## 2014-01-11 ENCOUNTER — Encounter: Payer: Self-pay | Admitting: Family Medicine

## 2014-02-15 LAB — HM MAMMOGRAPHY: HM Mammogram: NORMAL

## 2014-02-17 ENCOUNTER — Ambulatory Visit: Payer: Self-pay | Admitting: Family Medicine

## 2014-02-17 ENCOUNTER — Encounter: Payer: Self-pay | Admitting: Family Medicine

## 2014-02-17 LAB — HM MAMMOGRAPHY: HM MAMMO: NORMAL

## 2014-02-19 ENCOUNTER — Encounter: Payer: Self-pay | Admitting: *Deleted

## 2014-04-08 ENCOUNTER — Ambulatory Visit: Payer: Federal, State, Local not specified - PPO | Admitting: Family Medicine

## 2014-05-19 ENCOUNTER — Other Ambulatory Visit: Payer: Self-pay | Admitting: Family Medicine

## 2014-05-20 ENCOUNTER — Other Ambulatory Visit: Payer: Federal, State, Local not specified - PPO

## 2014-06-04 ENCOUNTER — Ambulatory Visit (INDEPENDENT_AMBULATORY_CARE_PROVIDER_SITE_OTHER): Payer: Federal, State, Local not specified - PPO | Admitting: Family Medicine

## 2014-06-04 ENCOUNTER — Encounter: Payer: Self-pay | Admitting: Family Medicine

## 2014-06-04 VITALS — BP 110/70 | HR 74 | Temp 98.5°F | Wt 161.0 lb

## 2014-06-04 DIAGNOSIS — J069 Acute upper respiratory infection, unspecified: Secondary | ICD-10-CM

## 2014-06-04 DIAGNOSIS — B9789 Other viral agents as the cause of diseases classified elsewhere: Principal | ICD-10-CM

## 2014-06-04 MED ORDER — VALACYCLOVIR HCL 500 MG PO TABS: ORAL_TABLET | ORAL | Status: DC

## 2014-06-04 MED ORDER — HYDROCOD POLST-CHLORPHEN POLST 10-8 MG/5ML PO LQCR: 5.0000 mL | Freq: Two times a day (BID) | ORAL | Status: DC

## 2014-06-04 NOTE — Assessment & Plan Note (Addendum)
Anticipate viral given short duration. Supportive care as per instructions tussionex pennkinetic for cough at bedtime. Works night shift. Update if not improving into next week.

## 2014-06-04 NOTE — Progress Notes (Signed)
Pre visit review using our clinic review tool, if applicable. No additional management support is needed unless otherwise documented below in the visit note. 

## 2014-06-04 NOTE — Progress Notes (Signed)
BP 110/70 mmHg  Pulse 74  Temp(Src) 98.5 F (36.9 C) (Oral)  Wt 161 lb (73.029 kg)  SpO2 98%   CC: cough  Subjective:    Patient ID: Kendra Jackson, female    DOB: 1960/08/17, 54 y.o.   MRN: [id number]  HPI: Kendra Jackson is a 54 y.o. female presenting on 06/04/2014 for Cough and Medication Management   4d h/o sore throat with worsening cough. + rhinorrhea. + L ear pain and tooth pain, chills,   No fevers, rashes, PNDrainage, headaches or significant congestion.   Stomach bug over weekend.  Granddaughter sick with stomach bug as well as cough recently. No h/o asthma.  So far has tried cheratussin but not effective.   Relevant past medical, surgical, family and social history reviewed and updated as indicated. Interim medical history since our last visit reviewed. Allergies and medications reviewed and updated. Current Outpatient Prescriptions on File Prior to Visit  Medication Sig  . aspirin 81 MG tablet Take 81 mg by mouth daily.  Marland Kitchen atenolol (TENORMIN) 50 MG tablet take 1 tablet by mouth every morning and 1 tablet by mouth every evening  . hydrochlorothiazide (MICROZIDE) 12.5 MG capsule take 1 capsule by mouth once daily  . LORazepam (ATIVAN) 1 MG tablet Take 1 tablet (1 mg total) by mouth at bedtime as needed for anxiety.  . Misc Natural Products (APPLE CIDER VINEGAR) TABS Take by mouth daily.  . Multiple Vitamin (MULTIVITAMIN) tablet Take 1 tablet by mouth daily.  Marland Kitchen omeprazole (PRILOSEC) 40 MG capsule take 1 capsule by mouth once daily   No current facility-administered medications on file prior to visit.    Review of Systems Per HPI unless specifically indicated above     Objective:    BP 110/70 mmHg  Pulse 74  Temp(Src) 98.5 F (36.9 C) (Oral)  Wt 161 lb (73.029 kg)  SpO2 98%  Wt Readings from Last 3 Encounters:  06/04/14 161 lb (73.029 kg)  12/25/13 166 lb 8 oz (75.524 kg)  12/24/13 166 lb (75.297 kg)    Physical Exam  Constitutional: Kendra Jackson  appears well-developed and well-nourished. No distress.  HENT:  Head: Normocephalic and atraumatic.  Right Ear: Hearing, tympanic membrane, external ear and ear canal normal.  Left Ear: Hearing, tympanic membrane, external ear and ear canal normal.  Nose: Mucosal edema (nasal turbinate swelling and injection) present. No rhinorrhea. Right sinus exhibits no maxillary sinus tenderness and no frontal sinus tenderness. Left sinus exhibits no maxillary sinus tenderness and no frontal sinus tenderness.  Mouth/Throat: Uvula is midline, oropharynx is clear and moist and mucous membranes are normal. No oropharyngeal exudate, posterior oropharyngeal edema, posterior oropharyngeal erythema or tonsillar abscesses.  Congestion behind TMs  Eyes: Conjunctivae and EOM are normal. Pupils are equal, round, and reactive to light. No scleral icterus.  Neck: Normal range of motion. Neck supple.  Cardiovascular: Normal rate, regular rhythm, normal heart sounds and intact distal pulses.   No murmur heard. Pulmonary/Chest: Effort normal and breath sounds normal. No respiratory distress. Kendra Jackson has no wheezes. Kendra Jackson has no rales.  Lymphadenopathy:    Kendra Jackson has no cervical adenopathy.  Skin: Skin is warm and dry. No rash noted.  Nursing note and vitals reviewed.     Assessment & Plan:   Problem List Items Addressed This Visit    Viral URI with cough - Primary    Anticipate viral given short duration. Supportive care as per instructions tussionex pennkinetic for cough at bedtime. Works night shift. Update  if not improving into next week.      Relevant Medications   valACYclovir (VALTREX) tablet       Follow up plan: Return if symptoms worsen or fail to improve.

## 2014-06-04 NOTE — Patient Instructions (Signed)
You have a viral upper respiratory infection. Antibiotics are not needed for this.  Viral infections usually take 7-10 days to resolve.  The cough can last a few weeks to go away. Use medication as prescribed: tussionex for cough at bedtime Push fluids and plenty of rest. Call clinic with questions.  Good to see you today. I hope you start feeling better soon. Please return if you are not improving as expected, or if you have high fevers (>101.5) or difficulty swallowing or worsening productive cough.

## 2014-06-20 ENCOUNTER — Emergency Department (HOSPITAL_COMMUNITY)
Admission: EM | Admit: 2014-06-20 | Discharge: 2014-06-20 | Disposition: A | Discharge status: Home / Self Care | Payer: Federal, State, Local not specified - PPO | Source: Home / Self Care | Attending: Family Medicine | Admitting: Family Medicine

## 2014-06-20 ENCOUNTER — Emergency Department (INDEPENDENT_AMBULATORY_CARE_PROVIDER_SITE_OTHER): Payer: Federal, State, Local not specified - PPO

## 2014-06-20 ENCOUNTER — Encounter (HOSPITAL_COMMUNITY): Payer: Self-pay | Admitting: Emergency Medicine

## 2014-06-20 DIAGNOSIS — J302 Other seasonal allergic rhinitis: Secondary | ICD-10-CM | POA: Diagnosis not present

## 2014-06-20 MED ORDER — METHYLPREDNISOLONE ACETATE 40 MG/ML IJ SUSP
80.0000 mg | Freq: Once | INTRAMUSCULAR | Status: AC
Start: 2014-06-20 — End: 2014-06-20
  Administered 2014-06-20: 80 mg via INTRAMUSCULAR

## 2014-06-20 MED ORDER — TRIAMCINOLONE ACETONIDE 40 MG/ML IJ SUSP
40.0000 mg | Freq: Once | INTRAMUSCULAR | Status: AC
Start: 2014-06-20 — End: 2014-06-20
  Administered 2014-06-20: 40 mg via INTRAMUSCULAR

## 2014-06-20 MED ORDER — METHYLPREDNISOLONE ACETATE 80 MG/ML IJ SUSP
INTRAMUSCULAR | Status: AC
  Filled 2014-06-20: qty 1

## 2014-06-20 MED ORDER — IPRATROPIUM BROMIDE 0.06 % NA SOLN: 2.0000 | Freq: Four times a day (QID) | NASAL | Status: DC

## 2014-06-20 MED ORDER — GI COCKTAIL ~~LOC~~
30.0000 mL | Freq: Once | ORAL | Status: AC
  Administered 2014-06-20: 30 mL via ORAL

## 2014-06-20 MED ORDER — TRIAMCINOLONE ACETONIDE 40 MG/ML IJ SUSP
INTRAMUSCULAR | Status: AC
  Filled 2014-06-20: qty 1

## 2014-06-20 MED ORDER — CETIRIZINE HCL 10 MG PO TABS: 10.0000 mg | ORAL_TABLET | Freq: Every day | ORAL | Status: DC

## 2014-06-20 MED ORDER — GI COCKTAIL ~~LOC~~
ORAL | Status: AC
  Filled 2014-06-20: qty 30

## 2014-06-20 NOTE — ED Notes (Signed)
Cough x 2 weeks. Seen by her PCP Maryanna Shape) & has been taking Rx cough syrup w minimal relief

## 2014-06-20 NOTE — ED Provider Notes (Signed)
CSN: [id number]     Arrival date & time 06/20/14  4166 History   First MD Initiated Contact with Patient 06/20/14 [phone]     Chief Complaint  Patient presents with  . Cough   (Consider location/radiation/quality/duration/timing/severity/associated sxs/prior Treatment) Patient is a 54 y.o. female presenting with cough. The history is provided by the patient.  Cough Cough characteristics:  Non-productive, dry, hacking and vomit-inducing Severity:  Moderate Onset quality:  Gradual Duration:  2 weeks Progression:  Unchanged Chronicity:  New Smoker: no   Ineffective treatments:  Cough suppressants Associated symptoms: chills, myalgias and rhinorrhea   Associated symptoms: no fever, no shortness of breath, no sinus congestion and no wheezing     Past Medical History  Diagnosis Date  . HTN (hypertension)   . History of colon polyps     hyperplastic polyp 2008, 2013 hemorrhoids, rec rpt 5 yrs  . Enlarged thyroid     h/o abnl labs but never treated  . Sickle cell trait   . 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 Abnormal pigmentation of skin 2004    L ear antihelix - bx consistent with SK Sharlett Iles), recheck stable per Dr. Kellie Moor  . History of herpes genitalis     valtrex prn  . HLD (hyperlipidemia)   . IBS (irritable bowel syndrome)   . Family history of breast cancer in first degree relative   . Reflux esophagitis     PPI  . Thyroid nodule 06/2011    R nodule, FNA biopsy = benign follicular, stable [place] (16/0109 and 12/2012)  . Constipation     severe requiring laxatives, failed miralax  . Subclinical hyperthyroidism 12/22/2012    yearly visit with endo   Past Surgical History  Procedure Laterality Date  . [facility]  11/2008    multiple uterine fibroids  . Colonoscopy  07/2011    hemorrhoids, rpt due 5 yrs given h/o polyps  . Thyroid US  12/2011    1cm solid nodule lower R pole (stable), no significant nodule on left side, rec rpt [place] in  1 yr   Family History  Problem Relation Age of Onset  . Heart disease Mother   . Heart disease Father     pig valve  . Coronary artery disease Father   . Cancer Father     prostate  . Cancer Sister     breast  . Diabetes Sister   . Mental illness Sister     schizophrenia  . Cancer Brother     appendix tumor, prostate  . Diabetes Brother   . Mental illness Brother     bipolar  . Cancer Maternal Aunt     bone  . Cancer Maternal Uncle     bladder  . Cancer Maternal Grandmother     colon  . Stroke Maternal Grandfather   . Coronary artery disease Paternal Grandfather   . Cancer Cousin     ovarian, bone  . Deep vein thrombosis Mother   . Dementia Mother 28   History  Substance Use Topics  . Smoking status: Never Smoker   . Smokeless tobacco: Never Used  . Alcohol Use: No     Comment: wine occassionally   OB History    No data available     Review of Systems  Constitutional: Positive for chills. Negative for fever.  HENT: Positive for congestion, postnasal drip and rhinorrhea.   Respiratory: Positive for cough. Negative for shortness of breath  and wheezing.   Cardiovascular: Negative.   Gastrointestinal: Negative.   Genitourinary: Negative.   Musculoskeletal: Positive for myalgias.    Allergies  Neosporin  Home Medications   Prior to Admission medications   Medication Sig Start Date End Date Taking? Authorizing Provider  aspirin 81 MG tablet Take 81 mg by mouth daily.   Yes Historical Provider, MD  atenolol (TENORMIN) 50 MG tablet take 1 tablet by mouth every morning and 1 tablet by mouth every evening 05/19/14  Yes Ria Bush, MD  chlorpheniramine-HYDROcodone [facility] PENNKINETIC ER) 10-8 MG/5ML LQCR Take 5 mLs by mouth 2 (two) times daily. Sedation precautions 06/04/14  Yes Ria Bush, MD  hydrochlorothiazide (MICROZIDE) 12.5 MG capsule take 1 capsule by mouth once daily 05/19/14  Yes Ria Bush, MD  LORazepam (ATIVAN) 1 MG tablet Take 1  tablet (1 mg total) by mouth at bedtime as needed for anxiety. 10/02/13  Yes Ria Bush, MD  Misc Natural Products (APPLE CIDER VINEGAR) TABS Take by mouth daily.   Yes Historical Provider, MD  Multiple Vitamin (MULTIVITAMIN) tablet Take 1 tablet by mouth daily.   Yes Historical Provider, MD  omeprazole (PRILOSEC) 40 MG capsule take 1 capsule by mouth once daily   Yes Ria Bush, MD  cetirizine (ZYRTEC) 10 MG tablet Take 1 tablet (10 mg total) by mouth daily. 06/20/14   Billy Fischer, MD  ipratropium (ATROVENT) 0.06 % nasal spray Place 2 sprays into both nostrils 4 (four) times daily. 06/20/14   Billy Fischer, MD  valACYclovir (VALTREX) 500 MG tablet TAKE 1 TABLET (500 MG TOTAL) BY MOUTH 2 (TWO) TIMES DAILY. FOR 3 DAYS FOR OUTBREAK (6 PILLS TOTAL). 06/04/14   Ria Bush, MD   BP 118/85 mmHg  Pulse 81  Temp(Src) 99 F (37.2 C) (Oral)  Resp 16  SpO2 99% Physical Exam  Constitutional: She is oriented to person, place, and time. She appears well-developed and well-nourished. She appears distressed.  HENT:  Head: Normocephalic.  Right Ear: External ear normal.  Left Ear: External ear normal.  Mouth/Throat: Oropharynx is clear and moist.  Eyes: Conjunctivae are normal. Pupils are equal, round, and reactive to light.  Neck: Normal range of motion. Neck supple.  Cardiovascular: Regular rhythm and normal heart sounds.   Pulmonary/Chest: Effort normal and breath sounds normal. She has no wheezes. She has no rales.  Lymphadenopathy:    She has no cervical adenopathy.  Neurological: She is alert and oriented to person, place, and time.  Skin: Skin is warm and dry.  Nursing note and vitals reviewed.   ED Course  Procedures (including critical care time) Labs Review Labs Reviewed - No data to display  Imaging Review Dg Chest 2 View  06/20/2014   CLINICAL DATA:  Cough for 2 weeks  EXAM: CHEST  2 VIEW  COMPARISON:  None.  FINDINGS: Cardiomediastinal silhouette is unremarkable. No  acute infiltrate or pleural effusion. No pulmonary edema. Mild degenerative changes mid thoracic spine.  IMPRESSION: No active cardiopulmonary disease. Mild degenerative changes mid thoracic spine.   Electronically Signed   By: Lahoma Crocker M.D.   On: 06/20/2014 10:33     MDM   1. Seasonal allergic rhinitis        Billy Fischer, MD 06/20/14 1042

## 2014-06-20 NOTE — ED Notes (Deleted)
C/o  A blistery rash going across left side of back in to the left axillary  And across chest.  Rash is painful and itchy.  No otc treatments tried.

## 2014-06-30 ENCOUNTER — Telehealth: Payer: Self-pay | Admitting: Family Medicine

## 2014-06-30 MED ORDER — AZITHROMYCIN 250 MG PO TABS: ORAL_TABLET | ORAL | Status: DC

## 2014-06-30 MED ORDER — HYDROCOD POLST-CHLORPHEN POLST 10-8 MG/5ML PO LQCR: 5.0000 mL | Freq: Two times a day (BID) | ORAL | Status: DC

## 2014-06-30 NOTE — Telephone Encounter (Signed)
Ongoing productive cough x 1 month, CXR clear.  was cough syrup helpful? If so could refill. Also will send in zpack to pharmacy. Return for OV if no improvement with this.

## 2014-06-30 NOTE — Telephone Encounter (Signed)
See [facility] notes in chart.

## 2014-06-30 NOTE — Telephone Encounter (Signed)
Pt called stating she was here to see dr g 06/04/14 he gave her some cough meds for the cough.  She went to urgent care 06/20/14  They took 2 chest xray.  They gave her steriod shot and meds.  She is still coughing.  She would like something else called in.  She is coughing up yellow with a little bit of blood (sometimes) not all the time   [facility] on [address]  Offered to make pt appointment she just wanted meds called in

## 2014-06-30 NOTE — Telephone Encounter (Signed)
Spoke with patient. She said the cough syrup helped at night. Advised abx had been sent in and new Rx for cough syrup was at the front desk to be picked up. She verbalized understanding. She then asked if abx could be sent to [facility] instead of [facility] since she had to come by here. Sent in as requested and cancelled Rx at [facility]. Advised to come in for recheck if no better after abx. She verbalized understanding.

## 2014-07-08 ENCOUNTER — Ambulatory Visit (INDEPENDENT_AMBULATORY_CARE_PROVIDER_SITE_OTHER): Payer: Federal, State, Local not specified - PPO | Admitting: Family Medicine

## 2014-07-08 ENCOUNTER — Encounter: Payer: Self-pay | Admitting: Family Medicine

## 2014-07-08 VITALS — BP 124/84 | HR 70 | Temp 98.1°F | Wt 155.2 lb

## 2014-07-08 DIAGNOSIS — J45901 Unspecified asthma with (acute) exacerbation: Secondary | ICD-10-CM

## 2014-07-08 MED ORDER — ALBUTEROL SULFATE HFA 108 (90 BASE) MCG/ACT IN AERS: 2.0000 | INHALATION_SPRAY | Freq: Four times a day (QID) | RESPIRATORY_TRACT | Status: DC | PRN

## 2014-07-08 MED ORDER — PREDNISONE 20 MG PO TABS: ORAL_TABLET | ORAL | Status: DC

## 2014-07-08 NOTE — Progress Notes (Signed)
BP 124/84 mmHg  Pulse 70  Temp(Src) 98.1 F (36.7 C) (Oral)  Wt 155 lb 4 oz (70.421 kg)  SpO2 98%   CC: cough  Subjective:    Patient ID: Kendra Jackson, female    DOB: 09-12-1960, 54 y.o.   MRN: [id number]  HPI: Kendra Jackson is a 54 y.o. female presenting on 07/08/2014 for Cough   Seen here 06/04/2014 with viral URI with cough with allergy contribution. Seen at [facility] on 06/20/2014 with dx seasonal allergic rhinitis exacerbation, clear CXR, treated with . Called in with worsening sxs 4/6 - sent in zpack and refilled tussionex cough syrup.  Persistent wheezing, mild dyspnea, nasal congestion, cough productive of phlegm, head congestion.  Cough slowly improving.   atrovent nasal spray not effective.   States amoxicillin and zpack usually doesn't work for her.   No h/o asthma or COPD. Never smoker, not around smokers.   Relevant past medical, surgical, family and social history reviewed and updated as indicated. Interim medical history since our last visit reviewed. Allergies and medications reviewed and updated. Current Outpatient Prescriptions on File Prior to Visit  Medication Sig  . aspirin 81 MG tablet Take 81 mg by mouth daily.  Marland Kitchen atenolol (TENORMIN) 50 MG tablet take 1 tablet by mouth every morning and 1 tablet by mouth every evening  . cetirizine (ZYRTEC) 10 MG tablet Take 1 tablet (10 mg total) by mouth daily.  . chlorpheniramine-HYDROcodone (TUSSIONEX PENNKINETIC ER) 10-8 MG/5ML LQCR Take 5 mLs by mouth 2 (two) times daily. Sedation precautions  . hydrochlorothiazide (MICROZIDE) 12.5 MG capsule take 1 capsule by mouth once daily  . ipratropium (ATROVENT) 0.06 % nasal spray Place 2 sprays into both nostrils 4 (four) times daily.  Marland Kitchen LORazepam (ATIVAN) 1 MG tablet Take 1 tablet (1 mg total) by mouth at bedtime as needed for anxiety.  . Misc Natural Products (APPLE CIDER VINEGAR) TABS Take by mouth daily.  . Multiple Vitamin (MULTIVITAMIN) tablet Take 1 tablet by mouth daily.    Marland Kitchen omeprazole (PRILOSEC) 40 MG capsule take 1 capsule by mouth once daily  . valACYclovir (VALTREX) 500 MG tablet TAKE 1 TABLET (500 MG TOTAL) BY MOUTH 2 (TWO) TIMES DAILY. FOR 3 DAYS FOR OUTBREAK (6 PILLS TOTAL).   No current facility-administered medications on file prior to visit.    Review of Systems Per HPI unless specifically indicated above     Objective:    BP 124/84 mmHg  Pulse 70  Temp(Src) 98.1 F (36.7 C) (Oral)  Wt 155 lb 4 oz (70.421 kg)  SpO2 98%  Wt Readings from Last 3 Encounters:  07/08/14 155 lb 4 oz (70.421 kg)  06/04/14 161 lb (73.029 kg)  12/25/13 166 lb 8 oz (75.524 kg)    Physical Exam  Constitutional: She appears well-developed and well-nourished. No distress.  HENT:  Head: Normocephalic and atraumatic.  Right Ear: Hearing normal.  Left Ear: Hearing normal.  Nose: Mucosal edema present. No rhinorrhea. Right sinus exhibits no maxillary sinus tenderness and no frontal sinus tenderness. Left sinus exhibits no maxillary sinus tenderness and no frontal sinus tenderness.  Mouth/Throat: Uvula is midline, oropharynx is clear and moist and mucous membranes are normal. No oropharyngeal exudate, posterior oropharyngeal edema, posterior oropharyngeal erythema or tonsillar abscesses.  Eyes: Conjunctivae and EOM are normal. Pupils are equal, round, and reactive to light. No scleral icterus.  Neck: Normal range of motion. Neck supple.  Cardiovascular: Normal rate, regular rhythm, normal heart sounds and intact distal pulses.   No  murmur heard. Pulmonary/Chest: Effort normal. No respiratory distress. She has no decreased breath sounds. She has wheezes. She has no rhonchi. She has no rales.  Coarse, faint exp wheezing RLL  Lymphadenopathy:    She has no cervical adenopathy.  Skin: Skin is warm and dry. No rash noted.  Nursing note and vitals reviewed.  Results for orders placed or performed in visit on 02/19/14  HM MAMMOGRAPHY  Result Value Ref Range   HM  Mammogram Normal Birads 1-Repeat 1 year       Assessment & Plan:   Problem List Items Addressed This Visit    Reactive airway disease with acute exacerbation - Primary    After initial viral URI/bronchitis. No smoking history. + exposure to allergens at work. rec face mask while at work. Treat with steroid course and albuterol inhaler. May continue tussionex cough syrup. Update if not improving with treatment, would consider rpt CXR and broadening abx. Pt agrees with plan.          Follow up plan: Return if symptoms worsen or fail to improve.

## 2014-07-08 NOTE — Patient Instructions (Signed)
I think you have reactive airway exacerbation. Treat with short prednisone course and albuterol inhaler 2 puffs every 6-8 hours as needed (puff at beginning of inhalation). Update [place] next week with effect of meds.

## 2014-07-08 NOTE — Assessment & Plan Note (Signed)
After initial viral URI/bronchitis. No smoking history. + exposure to allergens at work. rec face mask while at work. Treat with steroid course and albuterol inhaler. May continue tussionex cough syrup. Update if not improving with treatment, would consider rpt CXR and broadening abx. Pt agrees with plan.

## 2014-07-08 NOTE — Progress Notes (Signed)
Pre visit review using our clinic review tool, if applicable. No additional management support is needed unless otherwise documented below in the visit note. 

## 2014-10-06 ENCOUNTER — Other Ambulatory Visit: Payer: Self-pay | Admitting: Family Medicine

## 2014-12-07 ENCOUNTER — Other Ambulatory Visit: Payer: Self-pay | Admitting: Family Medicine

## 2014-12-19 ENCOUNTER — Other Ambulatory Visit: Payer: Self-pay | Admitting: Family Medicine

## 2014-12-19 DIAGNOSIS — I10 Essential (primary) hypertension: Secondary | ICD-10-CM

## 2014-12-19 DIAGNOSIS — E785 Hyperlipidemia, unspecified: Secondary | ICD-10-CM

## 2014-12-19 DIAGNOSIS — E0590 Thyrotoxicosis, unspecified without thyrotoxic crisis or storm: Secondary | ICD-10-CM

## 2014-12-24 ENCOUNTER — Other Ambulatory Visit (INDEPENDENT_AMBULATORY_CARE_PROVIDER_SITE_OTHER): Payer: Federal, State, Local not specified - PPO

## 2014-12-24 DIAGNOSIS — E785 Hyperlipidemia, unspecified: Secondary | ICD-10-CM | POA: Diagnosis not present

## 2014-12-24 DIAGNOSIS — E0590 Thyrotoxicosis, unspecified without thyrotoxic crisis or storm: Secondary | ICD-10-CM

## 2014-12-24 DIAGNOSIS — I10 Essential (primary) hypertension: Secondary | ICD-10-CM | POA: Diagnosis not present

## 2014-12-24 LAB — LIPID PANEL
CHOLESTEROL: 249 mg/dL — AB (ref 0–200)
HDL: 92.6 mg/dL (ref >39.00)
LDL Cholesterol: 137 mg/dL — ABNORMAL HIGH (ref 0–99)
NonHDL: 156.8
Total CHOL/HDL Ratio: 3
Triglycerides: 101 mg/dL (ref 0.0–149.0)
VLDL: 20.2 mg/dL (ref 0.0–40.0)

## 2014-12-24 LAB — BASIC METABOLIC PANEL
BUN: 16 mg/dL (ref 6–23)
CO2: 33 mEq/L — ABNORMAL HIGH (ref 19–32)
CREATININE: 0.85 mg/dL (ref 0.40–1.20)
Calcium: 9.7 mg/dL (ref 8.4–10.5)
Chloride: 102 mEq/L (ref 96–112)
GFR: 88.93 mL/min (ref >60.00)
GLUCOSE: 96 mg/dL (ref 70–99)
Potassium: 4.1 mEq/L (ref 3.5–5.1)
Sodium: 142 mEq/L (ref 135–145)

## 2014-12-24 LAB — TSH: TSH: 0.62 u[IU]/mL (ref 0.35–4.50)

## 2014-12-24 LAB — T3: T3, Total: 110.1 ng/dL (ref 80.0–204.0)

## 2014-12-24 LAB — T4, FREE: FREE T4: 0.7 ng/dL (ref 0.60–1.60)

## 2014-12-30 ENCOUNTER — Ambulatory Visit (INDEPENDENT_AMBULATORY_CARE_PROVIDER_SITE_OTHER): Payer: Federal, State, Local not specified - PPO | Admitting: Internal Medicine

## 2014-12-30 ENCOUNTER — Encounter: Payer: Self-pay | Admitting: Internal Medicine

## 2014-12-30 VITALS — BP 104/62 | HR 63 | Temp 98.7°F | Resp 12 | Wt 159.0 lb

## 2014-12-30 DIAGNOSIS — E041 Nontoxic single thyroid nodule: Secondary | ICD-10-CM | POA: Diagnosis not present

## 2014-12-30 DIAGNOSIS — E0590 Thyrotoxicosis, unspecified without thyrotoxic crisis or storm: Secondary | ICD-10-CM

## 2014-12-30 NOTE — Progress Notes (Addendum)
Patient ID: Kendra Jackson, female   DOB: 1960/06/11, 54 y.o.   MRN: [id number]   HPI  Kendra Jackson is a very pleasant 54 y.o.-year-old female, returning for f/u for subclinical hyperthyroidism and MNG. Last visit 1 year ago.  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  Reviewed hx: Pt was told she had overactive thyroid ~10 years ago. She also has small thyroid nodules, the dominant one (R lobe) was Bx'ed >> benign).  I reviewed pt's thyroid tests:  Lab Results  Component Value Date   TSH 0.62 12/24/2014   TSH 0.25* 12/18/2013   TSH 0.19* 07/23/2013   TSH 0.13* 06/11/2013   TSH 0.28* 12/10/2012   TSH 0.48 11/29/2011   TSH 0.91 12/06/2010   FREET4 0.70 12/24/2014   FREET4 0.80 12/18/2013   FREET4 0.77 07/23/2013   FREET4 0.67 06/11/2013   FREET4 0.81 12/10/2012    Pt denies feeling nodules in neck, hoarseness, dysphagia/odynophagia, SOB with lying down; she c/o: - no excessive sweating/heat intolerance  - no tremors - no anxiety - no palpitations - no fatigue - no hyperdefecation, had constipation "all my life" - no weight loss/gain - no hair loss - no dry skin  I reviewed pt's medications, allergies, PMH, social hx, family hx, and changes were documented in the history of present illness. Otherwise, unchanged from my initial visit note.  ROS: Constitutional: see HPI Eyes: no blurry vision, no xerophthalmia ENT: no sore throat, no nodules palpated in throat, no dysphagia/odynophagia, no hoarseness Cardiovascular: no CP/SOB/palpitations/leg swelling Respiratory: no cough/SOB/wheezing Gastrointestinal: no N/V/D/C/heartburn Musculoskeletal: no muscle/joint aches Skin: no rashes Neurological: no tremors/numbness/tingling/dizziness  PE: BP 104/62 mmHg  Pulse 63  Temp(Src) 98.7 F (37.1 C) (Oral)  Resp 12  Wt 159 lb (72.122 kg)  SpO2 99% Body mass index is 25.28 kg/(m^2). Wt Readings from Last 3 Encounters:  12/30/14  159 lb (72.122 kg)  07/08/14 155 lb 4 oz (70.421 kg)  06/04/14 161 lb (73.029 kg)   Constitutional: normal weight, in NAD Eyes: PERRLA, EOMI, no exophthalmos, no lid lag, no stare ENT: moist mucous membranes, no thyromegaly, no cervical lymphadenopathy Cardiovascular: RRR, No MRG Respiratory: CTA B Gastrointestinal: abdomen soft, NT, ND, BS+ Musculoskeletal: no deformities, strength intact in all 4 Skin: moist, warm, no rashes Neurological: no tremor with outstretched hands, DTR normal in all 4  ASSESSMENT: 1. 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 Homogeneous background parenchyma. 7 x 9 x 10 mm solid with calcifications, inferior right (previously 7 x 8 x 10)   Left thyroid lobe: 53 x 11 x 16 mm. Homogeneous background echotexture. At least 6 additional small bilateral nodules, 4 mm or less diameter.   Isthmus: 2.9 mm. No nodules visualized.   Lymphadenopathy None visualized. - may need a thyroid Uptake and scan   PLAN:  1. Patient with a h/o low TSH (subclinical hyperthyroidism), without thyrotoxic sxs. We reviewed recent TFTs  - normal 12/24/2014. 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 MNG - stable (reviewed U/S reports) - no neck compression sxs - last U/S in 12/2012 >> will repeat now, 2 years later  CLINICAL DATA: Thyroid nodules.  EXAM: THYROID ULTRASOUND  TECHNIQUE: Ultrasound examination of the thyroid gland and adjacent soft tissues was performed.  COMPARISON: 12/30/2012.  FINDINGS: Right thyroid lobe  Measurements: 4.9 x 1.5 x 1.5 cm. 1.0  x 0.8 x 1.0 cm solid nodule with calcifications inferior right thyroid lobe, previously 0.7 x 0.9 x 1.0 cm. Multiples small sub cm noncalcified nodules are noted scattered throughout the remainder of the right gland.  Left thyroid lobe  Measurements: 5.2 x 1.0 x 1.4 cm. Linear echodensity, possibly calcific calcification left mid gland, unchanged. 0.6 cm solid nodule lower portion left gland.  Isthmus  Thickness: 0.3 cm. No nodules visualized.  Lymphadenopathy  None visualized.  IMPRESSION: 1. 1.0 x 0.8 x 1.0 cm solid nodule with calcifications inferior right thyroid lobe, previously 0.7 x 0.9 x 1.0 cm. Given size calcifications, and interim growth, findings meet consensus criteria for biopsy. Ultrasound-guided fine needle aspiration should be considered, as per the consensus statement: Management of Thyroid Nodules Detected at [place]: Society of Radiologists in [place]. Radiology 2005; [id number].  2. Sub cm nodules both glands. These do not meet consensus criteria for biopsy.   Electronically Signed By: Marcello Moores Register On: 01/06/2015 08:49    Slight increase in size of the R thyroid lobe nodule,  However, not significant. I will, however, suggest biopsy because of the presence of micro-calcifications.

## 2014-12-30 NOTE — Patient Instructions (Signed)
Please schedule a thyroid ultrasound.  Please return in 1 year.

## 2014-12-31 ENCOUNTER — Encounter: Payer: Self-pay | Admitting: *Deleted

## 2014-12-31 ENCOUNTER — Telehealth: Payer: Self-pay

## 2014-12-31 ENCOUNTER — Encounter: Payer: Self-pay | Admitting: Family Medicine

## 2014-12-31 ENCOUNTER — Ambulatory Visit (INDEPENDENT_AMBULATORY_CARE_PROVIDER_SITE_OTHER): Payer: Federal, State, Local not specified - PPO | Admitting: Family Medicine

## 2014-12-31 ENCOUNTER — Other Ambulatory Visit (HOSPITAL_COMMUNITY)
Admission: RE | Admit: 2014-12-31 | Discharge: 2014-12-31 | Disposition: A | Discharge status: Home / Self Care | Payer: Federal, State, Local not specified - PPO | Source: Ambulatory Visit | Attending: Family Medicine | Admitting: Family Medicine

## 2014-12-31 VITALS — BP 118/76 | HR 72 | Temp 98.2°F | Ht 66.5 in | Wt 158.5 lb

## 2014-12-31 DIAGNOSIS — Z0000 Encounter for general adult medical examination without abnormal findings: Secondary | ICD-10-CM | POA: Diagnosis not present

## 2014-12-31 DIAGNOSIS — Z23 Encounter for immunization: Secondary | ICD-10-CM | POA: Diagnosis not present

## 2014-12-31 DIAGNOSIS — Z124 Encounter for screening for malignant neoplasm of cervix: Secondary | ICD-10-CM

## 2014-12-31 DIAGNOSIS — E785 Hyperlipidemia, unspecified: Secondary | ICD-10-CM

## 2014-12-31 DIAGNOSIS — Z1151 Encounter for screening for human papillomavirus (HPV): Secondary | ICD-10-CM | POA: Diagnosis present

## 2014-12-31 DIAGNOSIS — Z01419 Encounter for gynecological examination (general) (routine) without abnormal findings: Secondary | ICD-10-CM | POA: Insufficient documentation

## 2014-12-31 DIAGNOSIS — Z8619 Personal history of other infectious and parasitic diseases: Secondary | ICD-10-CM

## 2014-12-31 DIAGNOSIS — I10 Essential (primary) hypertension: Secondary | ICD-10-CM

## 2014-12-31 DIAGNOSIS — G4700 Insomnia, unspecified: Secondary | ICD-10-CM

## 2014-12-31 DIAGNOSIS — E0590 Thyrotoxicosis, unspecified without thyrotoxic crisis or storm: Secondary | ICD-10-CM

## 2014-12-31 LAB — HM PAP SMEAR: HM Pap smear: NORMAL

## 2014-12-31 MED ORDER — HYDROCHLOROTHIAZIDE 12.5 MG PO CAPS: 12.5000 mg | ORAL_CAPSULE | Freq: Every day | ORAL | Status: DC | PRN

## 2014-12-31 MED ORDER — ATENOLOL 50 MG PO TABS: ORAL_TABLET | ORAL | Status: DC

## 2014-12-31 MED ORDER — LORAZEPAM 1 MG PO TABS: 1.0000 mg | ORAL_TABLET | Freq: Every evening | ORAL | Status: DC | PRN

## 2014-12-31 MED ORDER — VALACYCLOVIR HCL 500 MG PO TABS: ORAL_TABLET | ORAL | Status: DC

## 2014-12-31 MED ORDER — OMEPRAZOLE 40 MG PO CPDR: 40.0000 mg | DELAYED_RELEASE_CAPSULE | Freq: Every day | ORAL | Status: DC

## 2014-12-31 NOTE — Telephone Encounter (Signed)
Pt was seen this AM for annual exam and received flushot; pt has just gotten home and has N&V x 2. No pain and no other symptoms at this time; pt continues nausea. Pt wants to know could be reaction to flu shot or what might have caused  N&V. Pt said N&V came on suddenly. Dr Danise Mina said N&V not usually related to flu shot. If that is the only symptom now can send Zofran 4 mg # 20 taking one tab po q6h prn for N&V or if other symptoms Dr Danise Mina will see pt again today at 12:30 pm. Pt said she does not want the Zofran at this time and does not think she needs to come back to office. Pt is going to rest and see how she feels; pt did note just now her stomach feels like she might have diarrhea. Pt said if needed she will call [place] back but for now will see how she does.

## 2014-12-31 NOTE — Assessment & Plan Note (Signed)
Valtrex refilled.

## 2014-12-31 NOTE — Assessment & Plan Note (Signed)
Reviewed elevated TC and LDL, great HDL. No need for med. Discussed diet changes to improve chol levels.

## 2014-12-31 NOTE — Assessment & Plan Note (Addendum)
Due to shift work. Takes prn ativan 1mg  for sleep.

## 2014-12-31 NOTE — Addendum Note (Signed)
Addended by: Royann Shivers A on: 12/31/2014 02:15 PM   Modules accepted: Orders

## 2014-12-31 NOTE — Progress Notes (Signed)
Pre visit review using our clinic review tool, if applicable. No additional management support is needed unless otherwise documented below in the visit note. 

## 2014-12-31 NOTE — Assessment & Plan Note (Signed)
Preventative protocols reviewed and updated unless pt declined. Discussed healthy diet and lifestyle.  

## 2014-12-31 NOTE — Assessment & Plan Note (Signed)
Continue f/u with endo. Appreciate their care.

## 2014-12-31 NOTE — Assessment & Plan Note (Signed)
Chronic, stable. Decrease hctz to prn. Continue atenolol 50mg  bid.

## 2014-12-31 NOTE — Progress Notes (Signed)
BP 118/76 mmHg  Pulse 72  Temp(Src) 98.2 F (36.8 C) (Oral)  Ht 5' 6.5" (1.689 m)  Wt 158 lb 8 oz (71.895 kg)  BMI 25.20 kg/m2   CC: CPE  Subjective:    Patient ID: Kendra Jackson, female    DOB: 11-04-1960, 54 y.o.   MRN: [id number]  HPI: [name] is a 54 y.o. female presenting on 12/31/2014 for Annual Exam   Subclinical hyperthyroidism - followed by Dr Cruzita Lederer. Sees Q 1 yr.  BP Readings from Last 3 Encounters:  12/31/14 118/76  12/30/14 104/62  07/08/14 124/84   On lorazepam prn sleep. Initially prescribed after grandson died. Rare use.   Preventative: COLONOSCOPY Date: 07/2011 hemorrhoids, rpt due 5 yrs given h/o polyps ([place]) Well woman 2013 - pap smear - remote h/o abnormal cells s/p cryotherapy. Next due 2016.  LMP - 04/12/2009, no bleeding since then.  Mammogram - 01/2014 normal,  Tetanus - Tdap 2013  Flu shot - today  Seat belt use discussed No changing moles on skin  Caffeine: rare  Lives alone, no pets. Grown son 28yo, grandson died suddenly at 1yo 2010 (drowning) - lots of stress from this.  Occupation: Tour manager, works third shift  Activity: no regular exercise but bought treadmill  Diet: good water, fruits daily, vegetables occasionally   Relevant past medical, surgical, family and social history reviewed and updated as indicated. Interim medical history since our last visit reviewed. Allergies and medications reviewed and updated. Current Outpatient Prescriptions on File Prior to Visit  Medication Sig  . albuterol (PROVENTIL HFA;VENTOLIN HFA) 108 (90 BASE) MCG/ACT inhaler Inhale 2 puffs into the lungs every 6 (six) hours as needed for wheezing or shortness of breath.  Marland Kitchen aspirin 81 MG tablet Take 81 mg by mouth daily.  . cetirizine (ZYRTEC) 10 MG tablet Take 1 tablet (10 mg total) by mouth daily.  Marland Kitchen ipratropium (ATROVENT) 0.06 % nasal spray Place 2 sprays into both nostrils 4 (four) times daily.  . Misc Natural Products (APPLE CIDER VINEGAR)  TABS Take by mouth daily.  . Multiple Vitamin (MULTIVITAMIN) tablet Take 1 tablet by mouth daily.   No current facility-administered medications on file prior to visit.    Review of Systems  Constitutional: Negative for fever, chills, activity change, appetite change, fatigue and unexpected weight change.  HENT: Negative for hearing loss.   Eyes: Negative for visual disturbance.  Respiratory: Negative for cough, chest tightness, shortness of breath and wheezing.   Cardiovascular: Negative for chest pain, palpitations and leg swelling.  Gastrointestinal: Positive for nausea and constipation. Negative for vomiting, abdominal pain, diarrhea, blood in stool and abdominal distention.  Genitourinary: Negative for hematuria and difficulty urinating.  Musculoskeletal: Negative for myalgias, arthralgias and neck pain.  Skin: Negative for rash.  Neurological: Negative for dizziness, seizures, syncope and headaches.  Hematological: Negative for adenopathy. Does not bruise/bleed easily.  Psychiatric/Behavioral: Negative for dysphoric mood. The patient is not nervous/anxious.    Per HPI unless specifically indicated above     Objective:    BP 118/76 mmHg  Pulse 72  Temp(Src) 98.2 F (36.8 C) (Oral)  Ht 5' 6.5" (1.689 m)  Wt 158 lb 8 oz (71.895 kg)  BMI 25.20 kg/m2  Wt Readings from Last 3 Encounters:  12/31/14 158 lb 8 oz (71.895 kg)  12/30/14 159 lb (72.122 kg)  07/08/14 155 lb 4 oz (70.421 kg)    Physical Exam  Constitutional: She is oriented to person, place, and time. She appears well-developed and  well-nourished. No distress.  HENT:  Head: Normocephalic and atraumatic.  Right Ear: Hearing, tympanic membrane, external ear and ear canal normal.  Left Ear: Hearing, tympanic membrane, external ear and ear canal normal.  Nose: Nose normal.  Mouth/Throat: Uvula is midline, oropharynx is clear and moist and mucous membranes are normal. No oropharyngeal exudate, posterior oropharyngeal  edema or posterior oropharyngeal erythema.  Eyes: Conjunctivae and EOM are normal. Pupils are equal, round, and reactive to light. No scleral icterus.  Neck: Normal range of motion. Neck supple. No thyromegaly present.  Cardiovascular: Normal rate, regular rhythm, normal heart sounds and intact distal pulses.   No murmur heard. Pulses:      Radial pulses are 2+ on the right side, and 2+ on the left side.  Pulmonary/Chest: Effort normal and breath sounds normal. No respiratory distress. She has no wheezes. She has no rales. Right breast exhibits no inverted nipple, no mass, no nipple discharge, no skin change and no tenderness. Left breast exhibits no inverted nipple, no mass, no nipple discharge, no skin change and no tenderness.  Abdominal: Soft. Bowel sounds are normal. She exhibits no distension and no mass. There is no tenderness. There is no rebound and no guarding.  Genitourinary: Vagina normal and uterus normal. Pelvic exam was performed with patient supine. There is no rash, tenderness, lesion or injury on the right labia. There is no rash, tenderness, lesion or injury on the left labia. Cervix exhibits no motion tenderness, no discharge and no friability. Right adnexum displays no mass, no tenderness and no fullness. Left adnexum displays no mass, no tenderness and no fullness.  Pap performed on cervix  Musculoskeletal: Normal range of motion. She exhibits no edema.  Lymphadenopathy:       Head (right side): No submental, no submandibular, no tonsillar, no preauricular and no posterior auricular adenopathy present.       Head (left side): No submental, no submandibular, no tonsillar, no preauricular and no posterior auricular adenopathy present.    She has no cervical adenopathy.       Right axillary: No lateral adenopathy present.       Left axillary: No lateral adenopathy present.      Right: No supraclavicular adenopathy present.       Left: No supraclavicular adenopathy present.    Neurological: She is alert and oriented to person, place, and time.  CN grossly intact, station and gait intact  Skin: Skin is warm and dry. No rash noted.  Psychiatric: She has a normal mood and affect. Her behavior is normal. Judgment and thought content normal.  Nursing note and vitals reviewed.  Results for orders placed or performed in visit on 85/88/50  Basic metabolic panel  Result Value Ref Range   Sodium 142 135 - 145 mEq/L   Potassium 4.1 3.5 - 5.1 mEq/L   Chloride 102 96 - 112 mEq/L   CO2 33 (H) 19 - 32 mEq/L   Glucose, Bld 96 70 - 99 mg/dL   BUN 16 6 - 23 mg/dL   Creatinine, Ser 0.85 0.40 - 1.20 mg/dL   Calcium 9.7 8.4 - 10.5 mg/dL   GFR 88.93 >60.00 mL/min  Lipid panel  Result Value Ref Range   Cholesterol 249 (H) 0 - 200 mg/dL   Triglycerides 101.0 0.0 - 149.0 mg/dL   HDL 92.60 >39.00 mg/dL   VLDL 20.2 0.0 - 40.0 mg/dL   LDL Cholesterol 137 (H) 0 - 99 mg/dL   Total CHOL/HDL Ratio 3  NonHDL 156.80   TSH  Result Value Ref Range   TSH 0.62 0.35 - 4.50 uIU/mL  T4, free  Result Value Ref Range   Free T4 0.70 0.60 - 1.60 ng/dL  T3  Result Value Ref Range   T3, Total 110.1 80.0 - 204.0 ng/dL      Assessment & Plan:   Problem List Items Addressed This Visit    Subclinical hyperthyroidism    Continue f/u with endo. Appreciate their care.      Relevant Medications   atenolol (TENORMIN) 50 MG tablet   Insomnia    Due to shift work. Takes prn ativan 1mg  for sleep.       HTN (hypertension)    Chronic, stable. Decrease hctz to prn. Continue atenolol 50mg  bid.      Relevant Medications   atenolol (TENORMIN) 50 MG tablet   hydrochlorothiazide (MICROZIDE) 12.5 MG capsule   HLD (hyperlipidemia)    Reviewed elevated TC and LDL, great HDL. No need for med. Discussed diet changes to improve chol levels.      Relevant Medications   atenolol (TENORMIN) 50 MG tablet   hydrochlorothiazide (MICROZIDE) 12.5 MG capsule   History of herpes genitalis    Valtrex  refilled      Healthcare maintenance - Primary    Preventative protocols reviewed and updated unless pt declined. Discussed healthy diet and lifestyle.        Other Visit Diagnoses    Need for influenza vaccination        Relevant Orders    Flu Vaccine QUAD 36+ mos PF IM (Fluarix & Fluzone Quad PF) (Completed)        Follow up plan: Return in about 1 year (around 12/31/2015), or as needed, for annual exam, prior fasting for blood work.

## 2014-12-31 NOTE — Patient Instructions (Signed)
Flu shot today Decrease hydrochlorothiazide to as needed only.  Let us know if blood pressure starts creeping up. Return as needed or in 1 year for next physical. Controlled substance agreement today.  Health Maintenance, Female Adopting a healthy lifestyle and getting preventive care can go a long way to promote health and wellness. Talk with your health care provider about what schedule of regular examinations is right for you. This is a good chance for you to check in with your provider about disease prevention and staying healthy. In between checkups, there are plenty of things you can do on your own. Experts have done a lot of research about which lifestyle changes and preventive measures are most likely to keep you healthy. Ask your health care provider for more information. WEIGHT AND DIET  Eat a healthy diet  Be sure to include plenty of vegetables, fruits, low-fat dairy products, and lean protein.  Do not eat a lot of foods high in solid fats, added sugars, or salt.  Get regular exercise. This is one of the most important things you can do for your health.  Most adults should exercise for at least 150 minutes each week. The exercise should increase your heart rate and make you sweat (moderate-intensity exercise).  Most adults should also do strengthening exercises at least twice a week. This is in addition to the moderate-intensity exercise.  Maintain a healthy weight  Body mass index (BMI) is a measurement that can be used to identify possible weight problems. It estimates body fat based on height and weight. Your health care provider can help determine your BMI and help you achieve or maintain a healthy weight.  For females 17 years of age and older:   A BMI below 18.5 is considered underweight.  A BMI of 18.5 to 24.9 is normal.  A BMI of 25 to 29.9 is considered overweight.  A BMI of 30 and above is considered obese.  Watch levels of cholesterol and blood lipids  You  should start having your blood tested for lipids and cholesterol at 56 years of age, then have this test every 5 years.  You may need to have your cholesterol levels checked more often if:  Your lipid or cholesterol levels are high.  You are older than 56 years of age.  You are at high risk for heart disease.  CANCER SCREENING   Lung Cancer  Lung cancer screening is recommended for adults 7-55 years old who are at high risk for lung cancer because of a history of smoking.  A yearly low-dose CT scan of the lungs is recommended for people who:  Currently smoke.  Have quit within the past 15 years.  Have at least a 30-pack-year history of smoking. A pack year is smoking an average of one pack of cigarettes a day for 1 year.  Yearly screening should continue until it has been 15 years since you quit.  Yearly screening should stop if you develop a health problem that would prevent you from having lung cancer treatment.  Breast Cancer  Practice breast self-awareness. This means understanding how your breasts normally appear and feel.  It also means doing regular breast self-exams. Let your health care provider know about any changes, no matter how small.  If you are in your 20s or 30s, you should have a clinical breast exam (CBE) by a health care provider every 1-3 years as part of a regular health exam.  If you are 65 or older, have a  CBE every year. Also consider having a breast X-ray (mammogram) every year.  If you have a family history of breast cancer, talk to your health care provider about genetic screening.  If you are at high risk for breast cancer, talk to your health care provider about having an MRI and a mammogram every year.  Breast cancer gene (BRCA) assessment is recommended for women who have family members with BRCA-related cancers. BRCA-related cancers include:  Breast.  Ovarian.  Tubal.  Peritoneal cancers.  Results of the assessment will determine  the need for genetic counseling and BRCA1 and BRCA2 testing. Cervical Cancer Your health care provider may recommend that you be screened regularly for cancer of the pelvic organs (ovaries, uterus, and vagina). This screening involves a pelvic examination, including checking for microscopic changes to the surface of your cervix (Pap test). You may be encouraged to have this screening done every 3 years, beginning at age 71.  For women ages 62-65, health care providers may recommend pelvic exams and Pap testing every 3 years, or they may recommend the Pap and pelvic exam, combined with testing for human papilloma virus (HPV), every 5 years. Some types of HPV increase your risk of cervical cancer. Testing for HPV may also be done on women of any age with unclear Pap test results.  Other health care providers may not recommend any screening for nonpregnant women who are considered low risk for pelvic cancer and who do not have symptoms. Ask your health care provider if a screening pelvic exam is right for you.  If you have had past treatment for cervical cancer or a condition that could lead to cancer, you need Pap tests and screening for cancer for at least 20 years after your treatment. If Pap tests have been discontinued, your risk factors (such as having a new sexual partner) need to be reassessed to determine if screening should resume. Some women have medical problems that increase the chance of getting cervical cancer. In these cases, your health care provider may recommend more frequent screening and Pap tests. Colorectal Cancer  This type of cancer can be detected and often prevented.  Routine colorectal cancer screening usually begins at 56 years of age and continues through 56 years of age.  Your health care provider may recommend screening at an earlier age if you have risk factors for colon cancer.  Your health care provider may also recommend using home test kits to check for hidden blood  in the stool.  A small camera at the end of a tube can be used to examine your colon directly (sigmoidoscopy or colonoscopy). This is done to check for the earliest forms of colorectal cancer.  Routine screening usually begins at age 30.  Direct examination of the colon should be repeated every 5-10 years through 56 years of age. However, you may need to be screened more often if early forms of precancerous polyps or small growths are found. Skin Cancer  Check your skin from head to toe regularly.  Tell your health care provider about any new moles or changes in moles, especially if there is a change in a mole's shape or color.  Also tell your health care provider if you have a mole that is larger than the size of a pencil eraser.  Always use sunscreen. Apply sunscreen liberally and repeatedly throughout the day.  Protect yourself by wearing long sleeves, pants, a wide-brimmed hat, and sunglasses whenever you are outside. HEART DISEASE, DIABETES, AND HIGH BLOOD  PRESSURE   High blood pressure causes heart disease and increases the risk of stroke. High blood pressure is more likely to develop in:  People who have blood pressure in the high end of the normal range (130-139/85-89 mm Hg).  People who are overweight or obese.  People who are African American.  If you are 71-47 years of age, have your blood pressure checked every 3-5 years. If you are 16 years of age or older, have your blood pressure checked every year. You should have your blood pressure measured twice--once when you are at a hospital or clinic, and once when you are not at a hospital or clinic. Record the average of the two measurements. To check your blood pressure when you are not at a hospital or clinic, you can use:  An automated blood pressure machine at a pharmacy.  A home blood pressure monitor.  If you are between 56 years and 71 years old, ask your health care provider if you should take aspirin to prevent  strokes.  Have regular diabetes screenings. This involves taking a blood sample to check your fasting blood sugar level.  If you are at a normal weight and have a low risk for diabetes, have this test once every three years after 56 years of age.  If you are overweight and have a high risk for diabetes, consider being tested at a younger age or more often. PREVENTING INFECTION  Hepatitis B  If you have a higher risk for hepatitis B, you should be screened for this virus. You are considered at high risk for hepatitis B if:  You were born in a country where hepatitis B is common. Ask your health care provider which countries are considered high risk.  Your parents were born in a high-risk country, and you have not been immunized against hepatitis B (hepatitis B vaccine).  You have HIV or AIDS.  You use needles to inject street drugs.  You live with someone who has hepatitis B.  You have had sex with someone who has hepatitis B.  You get hemodialysis treatment.  You take certain medicines for conditions, including cancer, organ transplantation, and autoimmune conditions. Hepatitis C  Blood testing is recommended for:  Everyone born from [birth year] through 1967.  Anyone with known risk factors for hepatitis C. Sexually transmitted infections (STIs)  You should be screened for sexually transmitted infections (STIs) including gonorrhea and chlamydia if:  You are sexually active and are younger than 56 years of age.  You are older than 56 years of age and your health care provider tells you that you are at risk for this type of infection.  Your sexual activity has changed since you were last screened and you are at an increased risk for chlamydia or gonorrhea. Ask your health care provider if you are at risk.  If you do not have HIV, but are at risk, it may be recommended that you take a prescription medicine daily to prevent HIV infection. This is called pre-exposure prophylaxis  (PrEP). You are considered at risk if:  You are sexually active and do not regularly use condoms or know the HIV status of your partner(s).  You take drugs by injection.  You are sexually active with a partner who has HIV. Talk with your health care provider about whether you are at high risk of being infected with HIV. If you choose to begin PrEP, you should first be tested for HIV. You should then be tested every 3  months for as long as you are taking PrEP.  PREGNANCY   If you are premenopausal and you may become pregnant, ask your health care provider about preconception counseling.  If you may become pregnant, take 400 to 800 micrograms (mcg) of folic acid every day.  If you want to prevent pregnancy, talk to your health care provider about birth control (contraception). OSTEOPOROSIS AND MENOPAUSE   Osteoporosis is a disease in which the bones lose minerals and strength with aging. This can result in serious bone fractures. Your risk for osteoporosis can be identified using a bone density scan.  If you are 30 years of age or older, or if you are at risk for osteoporosis and fractures, ask your health care provider if you should be screened.  Ask your health care provider whether you should take a calcium or vitamin D supplement to lower your risk for osteoporosis.  Menopause may have certain physical symptoms and risks.  Hormone replacement therapy may reduce some of these symptoms and risks. Talk to your health care provider about whether hormone replacement therapy is right for you.  HOME CARE INSTRUCTIONS   Schedule regular health, dental, and eye exams.  Stay current with your immunizations.   Do not use any tobacco products including cigarettes, chewing tobacco, or electronic cigarettes.  If you are pregnant, do not drink alcohol.  If you are breastfeeding, limit how much and how often you drink alcohol.  Limit alcohol intake to no more than 1 drink per day for  nonpregnant women. One drink equals 12 ounces of beer, 5 ounces of wine, or 1 ounces of hard liquor.  Do not use street drugs.  Do not share needles.  Ask your health care provider for help if you need support or information about quitting drugs.  Tell your health care provider if you often feel depressed.  Tell your health care provider if you have ever been abused or do not feel safe at home.   This information is not intended to replace advice given to you by your health care provider. Make sure you discuss any questions you have with your health care provider.   Document Released: 09/25/2010 Document Revised: 04/02/2014 Document Reviewed: 02/11/2013 Elsevier Interactive Patient Education [employer].

## 2015-01-04 LAB — CYTOLOGY - PAP

## 2015-01-05 ENCOUNTER — Ambulatory Visit
Admission: RE | Admit: 2015-01-05 | Discharge: 2015-01-05 | Disposition: A | Discharge status: Home / Self Care | Payer: Federal, State, Local not specified - PPO | Source: Ambulatory Visit | Attending: Internal Medicine | Admitting: Internal Medicine

## 2015-01-06 ENCOUNTER — Ambulatory Visit: Payer: Federal, State, Local not specified - PPO | Admitting: Endocrinology

## 2015-01-06 NOTE — Addendum Note (Signed)
Addended by: Philemon Kingdom on: 01/06/2015 01:44 PM   Modules accepted: Orders

## 2015-01-07 ENCOUNTER — Encounter: Payer: Self-pay | Admitting: *Deleted

## 2015-01-14 ENCOUNTER — Other Ambulatory Visit: Payer: Self-pay | Admitting: Family Medicine

## 2015-01-20 ENCOUNTER — Encounter: Payer: Self-pay | Admitting: Family Medicine

## 2015-02-02 ENCOUNTER — Other Ambulatory Visit: Payer: Self-pay | Admitting: Family Medicine

## 2015-02-02 DIAGNOSIS — Z1231 Encounter for screening mammogram for malignant neoplasm of breast: Secondary | ICD-10-CM

## 2015-02-21 ENCOUNTER — Ambulatory Visit
Admission: RE | Admit: 2015-02-21 | Discharge: 2015-02-21 | Disposition: A | Discharge status: Home / Self Care | Payer: Federal, State, Local not specified - PPO | Source: Ambulatory Visit | Attending: Family Medicine | Admitting: Family Medicine

## 2015-02-21 DIAGNOSIS — Z1231 Encounter for screening mammogram for malignant neoplasm of breast: Secondary | ICD-10-CM | POA: Diagnosis present

## 2015-02-22 LAB — HM MAMMOGRAPHY: HM Mammogram: NORMAL

## 2015-02-23 ENCOUNTER — Encounter: Payer: Self-pay | Admitting: *Deleted

## 2015-03-01 ENCOUNTER — Telehealth: Payer: Self-pay | Admitting: Internal Medicine

## 2015-03-01 NOTE — Telephone Encounter (Signed)
Spoke with pt she doesn't want to schedule at this time. Want to wait until next year.

## 2015-04-22 ENCOUNTER — Ambulatory Visit (INDEPENDENT_AMBULATORY_CARE_PROVIDER_SITE_OTHER): Payer: Federal, State, Local not specified - PPO | Admitting: Family Medicine

## 2015-04-22 ENCOUNTER — Encounter: Payer: Self-pay | Admitting: Family Medicine

## 2015-04-22 VITALS — BP 122/82 | HR 68 | Temp 98.3°F | Wt 159.2 lb

## 2015-04-22 DIAGNOSIS — R05 Cough: Secondary | ICD-10-CM

## 2015-04-22 DIAGNOSIS — R059 Cough, unspecified: Secondary | ICD-10-CM

## 2015-04-22 DIAGNOSIS — E041 Nontoxic single thyroid nodule: Secondary | ICD-10-CM

## 2015-04-22 MED ORDER — GUAIFENESIN-CODEINE 100-10 MG/5ML PO SYRP: 5.0000 mL | ORAL_SOLUTION | Freq: Two times a day (BID) | ORAL | Status: DC | PRN

## 2015-04-22 NOTE — Patient Instructions (Addendum)
I think you have resolving respiratory infection Treat with codeine cough syrup. May take mucinex during day with plenty of water to help mobilize mucous Watch for next few days - should continue to improve.  If fever >101 or worsening productive cough, call us and let us know.

## 2015-04-22 NOTE — Assessment & Plan Note (Addendum)
Anticipate resolving URI - provided cheratussin cough syrup for bedtime, discussed mucinex during the day. Update if fever >101 or worsening productive cough to consider further intervention.

## 2015-04-22 NOTE — Progress Notes (Signed)
Pre visit review using our clinic review tool, if applicable. No additional management support is needed unless otherwise documented below in the visit note. 

## 2015-04-22 NOTE — Progress Notes (Signed)
BP 122/82 mmHg  Pulse 68  Temp(Src) 98.3 F (36.8 C) (Oral)  Wt 159 lb 4 oz (72.235 kg)  SpO2 98%   CC: cough  Subjective:    Patient ID: Kendra Jackson, female    DOB: [birth year], 55 y.o.   MRN: [id number]  HPI: Kendra Jackson is a 55 y.o. female presenting on 04/22/2015 for Cough   2 wk h/o productive cough, congestion, mild wheezing and chills. Cough causing back pain. Some nasal congestion but no headaches or sinus pressure. PNdrainage.   No fevers, ear pain, tooth pain, ST.  No sick contacts at home. No smokers at home.  No h/o asthma/COPD  States amox and zpack don't work for her.   Relevant past medical, surgical, family and social history reviewed and updated as indicated. Interim medical history since our last visit reviewed. Allergies and medications reviewed and updated. Current Outpatient Prescriptions on File Prior to Visit  Medication Sig  . aspirin 81 MG tablet Take 81 mg by mouth daily.  Marland Kitchen atenolol (TENORMIN) 50 MG tablet Take one tablet twice daily  . cetirizine (ZYRTEC) 10 MG tablet Take 1 tablet (10 mg total) by mouth daily.  . hydrochlorothiazide (MICROZIDE) 12.5 MG capsule Take 1 capsule (12.5 mg total) by mouth daily as needed (swelling).  . LORazepam (ATIVAN) 1 MG tablet Take 1 tablet (1 mg total) by mouth at bedtime as needed for anxiety or sleep.  . Misc Natural Products (APPLE CIDER VINEGAR) TABS Take by mouth daily.  . Multiple Vitamin (MULTIVITAMIN) tablet Take 1 tablet by mouth daily.  Marland Kitchen omeprazole (PRILOSEC) 40 MG capsule Take 1 capsule (40 mg total) by mouth daily.  Marland Kitchen albuterol (PROVENTIL HFA;VENTOLIN HFA) 108 (90 BASE) MCG/ACT inhaler Inhale 2 puffs into the lungs every 6 (six) hours as needed for wheezing or shortness of breath. (Patient not taking: Reported on 04/22/2015)  . valACYclovir (VALTREX) 500 MG tablet TAKE 1 TABLET (500 MG TOTAL) BY MOUTH 2 (TWO) TIMES DAILY. FOR 3 DAYS FOR OUTBREAK (6 PILLS TOTAL). (Patient not taking: Reported on  04/22/2015)   No current facility-administered medications on file prior to visit.    Review of Systems Per HPI unless specifically indicated in ROS section     Objective:    BP 122/82 mmHg  Pulse 68  Temp(Src) 98.3 F (36.8 C) (Oral)  Wt 159 lb 4 oz (72.235 kg)  SpO2 98%  Wt Readings from Last 3 Encounters:  04/22/15 159 lb 4 oz (72.235 kg)  12/31/14 158 lb 8 oz (71.895 kg)  12/30/14 159 lb (72.122 kg)    Physical Exam  Constitutional: She appears well-developed and well-nourished. No distress.  HENT:  Head: Normocephalic and atraumatic.  Right Ear: Hearing, tympanic membrane, external ear and ear canal normal.  Left Ear: Hearing, tympanic membrane, external ear and ear canal normal.  Nose: Mucosal edema present. No rhinorrhea. Right sinus exhibits no maxillary sinus tenderness and no frontal sinus tenderness. Left sinus exhibits no maxillary sinus tenderness and no frontal sinus tenderness.  Mouth/Throat: Uvula is midline, oropharynx is clear and moist and mucous membranes are normal. No oropharyngeal exudate, posterior oropharyngeal edema, posterior oropharyngeal erythema or tonsillar abscesses.  Pale inflamed nasal mucosa  Eyes: Conjunctivae and EOM are normal. Pupils are equal, round, and reactive to light. No scleral icterus.  Neck: Normal range of motion. Neck supple.  Cardiovascular: Normal rate, regular rhythm, normal heart sounds and intact distal pulses.   No murmur heard. Pulmonary/Chest: Effort normal and breath sounds normal. No  respiratory distress. She has no wheezes. She has no rales.  Lungs clear  Lymphadenopathy:    She has no cervical adenopathy.  Skin: Skin is warm and dry. No rash noted.  Nursing note and vitals reviewed.  Results for orders placed or performed in visit on 02/23/15  HM MAMMOGRAPHY  Result Value Ref Range   HM Mammogram Normal Birads 1-Repeat 1 year       Assessment & Plan:   Problem List Items Addressed This Visit    Thyroid  nodule    Discussed changing thyroid nodule and encouraged she complete recommended biopsy.      Cough - Primary    Anticipate resolving URI - provided cheratussin cough syrup for bedtime, discussed mucinex during the day. Update if fever >101 or worsening productive cough to consider further intervention.          Follow up plan: Return if symptoms worsen or fail to improve.

## 2015-04-22 NOTE — Assessment & Plan Note (Signed)
Discussed changing thyroid nodule and encouraged she complete recommended biopsy.

## 2015-08-25 ENCOUNTER — Ambulatory Visit (INDEPENDENT_AMBULATORY_CARE_PROVIDER_SITE_OTHER): Payer: Federal, State, Local not specified - PPO | Admitting: Family Medicine

## 2015-08-25 ENCOUNTER — Encounter: Payer: Self-pay | Admitting: Family Medicine

## 2015-08-25 VITALS — BP 116/70 | HR 68 | Temp 98.7°F | Wt 160.5 lb

## 2015-08-25 DIAGNOSIS — L03119 Cellulitis of unspecified part of limb: Secondary | ICD-10-CM | POA: Diagnosis not present

## 2015-08-25 MED ORDER — TRIAMCINOLONE ACETONIDE 0.1 % EX CREA: 1.0000 "application " | TOPICAL_CREAM | Freq: Two times a day (BID) | CUTANEOUS | Status: DC

## 2015-08-25 MED ORDER — CEPHALEXIN 500 MG PO CAPS: 500.0000 mg | ORAL_CAPSULE | Freq: Three times a day (TID) | ORAL | Status: DC

## 2015-08-25 NOTE — Assessment & Plan Note (Signed)
Local irritative reaction after bug bite at elbow vs early developing cellulitis. Cover for both with keflex course + TCI cream. Update if not improving with treatment. Pt agrees with plan.

## 2015-08-25 NOTE — Progress Notes (Signed)
   BP 116/70 mmHg  Pulse 68  Temp(Src) 98.7 F (37.1 C) (Oral)  Wt 160 lb 8 oz (72.802 kg)   CC: check elbow ?bite Subjective:    Patient ID: Kendra Jackson, female    DOB: 1960/10/02, 55 y.o.   MRN: [id number]  HPI: Kendra Jackson is a 55 y.o. female presenting on 08/25/2015 for Elbow Problem   R elbow skin swelling/redness may have started after bug bite. Erythematous but denies warmth. Tender but mainly itchy. Possible fever/sweats.   Self treated with vinegar, peroxide, witch hazel, hydrocortisone cream - not improving.  Took benadryl this morning.   Relevant past medical, surgical, family and social history reviewed and updated as indicated. Interim medical history since our last visit reviewed. Allergies and medications reviewed and updated. Current Outpatient Prescriptions on File Prior to Visit  Medication Sig  . aspirin 81 MG tablet Take 81 mg by mouth daily.  Marland Kitchen atenolol (TENORMIN) 50 MG tablet Take one tablet twice daily  . cetirizine (ZYRTEC) 10 MG tablet Take 1 tablet (10 mg total) by mouth daily.  . hydrochlorothiazide (MICROZIDE) 12.5 MG capsule Take 1 capsule (12.5 mg total) by mouth daily as needed (swelling).  . LORazepam (ATIVAN) 1 MG tablet Take 1 tablet (1 mg total) by mouth at bedtime as needed for anxiety or sleep.  . Misc Natural Products (APPLE CIDER VINEGAR) TABS Take by mouth daily.  . Multiple Vitamin (MULTIVITAMIN) tablet Take 1 tablet by mouth daily.  Marland Kitchen omeprazole (PRILOSEC) 40 MG capsule Take 1 capsule (40 mg total) by mouth daily.  . valACYclovir (VALTREX) 500 MG tablet TAKE 1 TABLET (500 MG TOTAL) BY MOUTH 2 (TWO) TIMES DAILY. FOR 3 DAYS FOR OUTBREAK (6 PILLS TOTAL).   No current facility-administered medications on file prior to visit.    Review of Systems Per HPI unless specifically indicated in ROS section     Objective:    BP 116/70 mmHg  Pulse 68  Temp(Src) 98.7 F (37.1 C) (Oral)  Wt 160 lb 8 oz (72.802 kg)  Wt Readings from Last 3  Encounters:  08/25/15 160 lb 8 oz (72.802 kg)  04/22/15 159 lb 4 oz (72.235 kg)  12/31/14 158 lb 8 oz (71.895 kg)    Physical Exam  Constitutional: She appears well-developed and well-nourished. No distress.  Musculoskeletal: She exhibits edema.  FROM at elbow No pain at olecranon or elbow landmarks  Skin: Skin is warm and dry. There is erythema (slightly surrounding elbow bites).  2 bite marks at posterior forearm skin distal to elbow with mild surrounding erythema without significant warmth  Nursing note and vitals reviewed.     Assessment & Plan:   Problem List Items Addressed This Visit    Cellulitis of elbow - Primary    Local irritative reaction after bug bite at elbow vs early developing cellulitis. Cover for both with keflex course + TCI cream. Update if not improving with treatment. Pt agrees with plan.          Follow up plan: Return if symptoms worsen or fail to improve.  Kendra Bush, MD

## 2015-08-25 NOTE — Progress Notes (Signed)
Pre visit review using our clinic review tool, if applicable. No additional management support is needed unless otherwise documented below in the visit note. 

## 2015-08-25 NOTE — Patient Instructions (Addendum)
Looks like local reaction to some sort of bug bite, possibly early skin infection. Treat with keflex course. May use steroid cream to itchy spots. May continue benadry as needed for itch.

## 2015-08-30 ENCOUNTER — Telehealth: Payer: Self-pay | Admitting: Internal Medicine

## 2015-08-30 ENCOUNTER — Other Ambulatory Visit: Payer: Self-pay | Admitting: Internal Medicine

## 2015-08-30 ENCOUNTER — Telehealth: Payer: Self-pay

## 2015-08-30 DIAGNOSIS — E041 Nontoxic single thyroid nodule: Secondary | ICD-10-CM

## 2015-08-30 NOTE — Telephone Encounter (Signed)
Pt tried to schedule her Thyroid Biopsy at [facility]. They informed her she needed a new referral. I advised her she had called the PCP office and needed to get with Dr Cruzita Lederer to do the new referral. I told her I would send the note for her. Please contact pt with new referral

## 2015-08-30 NOTE — Telephone Encounter (Signed)
She had one in 12/2014... Need to repeat this before the Bx???

## 2015-08-30 NOTE — Telephone Encounter (Signed)
Please read message below and advise.  

## 2015-08-30 NOTE — Telephone Encounter (Signed)
I reordered the biopsy.

## 2015-08-30 NOTE — Telephone Encounter (Signed)
Patient stated she need an appointment for another Thyroid ultrasound.

## 2015-08-31 ENCOUNTER — Telehealth: Payer: Self-pay | Admitting: *Deleted

## 2015-08-31 NOTE — Telephone Encounter (Signed)
Pt called back concerning the U/S. She advised that she was called by [facility] and they told her she would need another U/S due to the fact that it has been 6 months. Be advised.

## 2015-08-31 NOTE — Telephone Encounter (Signed)
Called pt and lvm advising her per Dr Arman Filter message. Advised pt to call back tomorrow.

## 2015-09-01 ENCOUNTER — Other Ambulatory Visit: Payer: Self-pay | Admitting: Internal Medicine

## 2015-09-01 DIAGNOSIS — E041 Nontoxic single thyroid nodule: Secondary | ICD-10-CM

## 2015-09-01 NOTE — Telephone Encounter (Signed)
Called patient. Advised she should get a call from the referral department with time and date. Also advised about havinf BX in the same session. Patient agreed

## 2015-09-01 NOTE — Telephone Encounter (Signed)
OK, I ordered it, but they should try to do the U/S and Bx in the same session, then.

## 2015-09-06 ENCOUNTER — Ambulatory Visit
Admission: RE | Admit: 2015-09-06 | Discharge: 2015-09-06 | Disposition: A | Discharge status: Home / Self Care | Payer: Federal, State, Local not specified - PPO | Source: Ambulatory Visit | Attending: Internal Medicine | Admitting: Internal Medicine

## 2015-09-06 DIAGNOSIS — E042 Nontoxic multinodular goiter: Secondary | ICD-10-CM | POA: Diagnosis not present

## 2015-09-14 ENCOUNTER — Other Ambulatory Visit: Payer: Self-pay | Admitting: Family Medicine

## 2015-09-20 ENCOUNTER — Other Ambulatory Visit (HOSPITAL_COMMUNITY)
Admission: RE | Admit: 2015-09-20 | Discharge: 2015-09-20 | Disposition: A | Discharge status: Home / Self Care | Payer: Federal, State, Local not specified - PPO | Source: Ambulatory Visit | Attending: Radiology | Admitting: Radiology

## 2015-09-20 ENCOUNTER — Ambulatory Visit
Admission: RE | Admit: 2015-09-20 | Discharge: 2015-09-20 | Disposition: A | Discharge status: Home / Self Care | Payer: Federal, State, Local not specified - PPO | Source: Ambulatory Visit | Attending: Internal Medicine | Admitting: Internal Medicine

## 2015-09-20 DIAGNOSIS — E041 Nontoxic single thyroid nodule: Secondary | ICD-10-CM | POA: Insufficient documentation

## 2015-09-22 ENCOUNTER — Telehealth: Payer: Self-pay

## 2015-09-22 NOTE — Telephone Encounter (Signed)
Called patient to give results. Patient had no questions or concerns.

## 2015-10-04 ENCOUNTER — Encounter: Payer: Self-pay | Admitting: *Deleted

## 2015-10-04 ENCOUNTER — Encounter: Payer: Self-pay | Admitting: Family Medicine

## 2015-10-04 ENCOUNTER — Ambulatory Visit (INDEPENDENT_AMBULATORY_CARE_PROVIDER_SITE_OTHER): Payer: Federal, State, Local not specified - PPO | Admitting: Family Medicine

## 2015-10-04 VITALS — BP 120/80 | HR 68 | Temp 98.2°F | Ht 66.5 in | Wt 163.5 lb

## 2015-10-04 DIAGNOSIS — M79605 Pain in left leg: Secondary | ICD-10-CM | POA: Diagnosis not present

## 2015-10-04 DIAGNOSIS — M545 Low back pain, unspecified: Secondary | ICD-10-CM | POA: Insufficient documentation

## 2015-10-04 NOTE — Assessment & Plan Note (Addendum)
Anticipate lumbar sprain - supportive care discussed, rec heating pad and rest. Out of work x 1 day, no heavy lifting x 1 wk. Update if not improved with treatment. OTC NSAID PRN.

## 2015-10-04 NOTE — Progress Notes (Signed)
BP 120/80 mmHg  Pulse 68  Temp(Src) 98.2 F (36.8 C) (Oral)  Ht 5' 6.5" (1.689 m)  Wt 163 lb 8 oz (74.163 kg)  BMI 26.00 kg/m2   Chief Complaint  Patient presents with  . Leg Pain    left  . Knee Pain    right  . Back Pain     Subjective:    Patient ID: Kendra Jackson, female    DOB: 1960-10-21, 55 y.o.   MRN: [id number]  HPI: Kendra Jackson is a 55 y.o. female presenting on 10/04/2015 for Leg Pain; Knee Pain; and Back Pain   L leg pain soreness started 10d ago. At times leg buckles from discomfort. No inciting trauma, injury. Points to lower lateral leg. No redness, warmth, swelling. Some cramping "contractures". Has previously been evaluated for this with normal ultrasound.   Some know back pain at L5. Worse with prolonged standing.   Started magnesium 250mg  OTC supplement.   Out of work all this week, requests work note to be out next few days.   Some R knee irritation after dancing all night long in heels - now better.   Relevant past medical, surgical, family and social history reviewed and updated as indicated. Interim medical history since our last visit reviewed. Allergies and medications reviewed and updated. Current Outpatient Prescriptions on File Prior to Visit  Medication Sig  . aspirin 81 MG tablet Take 81 mg by mouth daily.  Marland Kitchen atenolol (TENORMIN) 50 MG tablet Take one tablet twice daily  . cetirizine (ZYRTEC) 10 MG tablet Take 1 tablet (10 mg total) by mouth daily.  . hydrochlorothiazide (MICROZIDE) 12.5 MG capsule TAKE 1 CAPSULE (12.5 MG TOTAL) BY MOUTH DAILY AS NEEDED (SWELLING).  . LORazepam (ATIVAN) 1 MG tablet Take 1 tablet (1 mg total) by mouth at bedtime as needed for anxiety or sleep.  . Misc Natural Products (APPLE CIDER VINEGAR) TABS Take by mouth daily.  . Multiple Vitamin (MULTIVITAMIN) tablet Take 1 tablet by mouth daily.  Marland Kitchen omeprazole (PRILOSEC) 40 MG capsule Take 1 capsule (40 mg total) by mouth daily.  Marland Kitchen triamcinolone cream (KENALOG) 0.1 %  Apply 1 application topically 2 (two) times daily. Apply to AA. (Patient not taking: Reported on 10/04/2015)  . valACYclovir (VALTREX) 500 MG tablet TAKE 1 TABLET (500 MG TOTAL) BY MOUTH 2 (TWO) TIMES DAILY. FOR 3 DAYS FOR OUTBREAK (6 PILLS TOTAL). (Patient not taking: Reported on 10/04/2015)   No current facility-administered medications on file prior to visit.    Review of Systems Per HPI unless specifically indicated in ROS section     Objective:    BP 120/80 mmHg  Pulse 68  Temp(Src) 98.2 F (36.8 C) (Oral)  Ht 5' 6.5" (1.689 m)  Wt 163 lb 8 oz (74.163 kg)  BMI 26.00 kg/m2  Wt Readings from Last 3 Encounters:  10/04/15 163 lb 8 oz (74.163 kg)  08/25/15 160 lb 8 oz (72.802 kg)  04/22/15 159 lb 4 oz (72.235 kg)    Physical Exam  Constitutional: She appears well-developed and well-nourished. No distress.  Musculoskeletal: She exhibits no edema.  No pain midline spine No paraspinous mm tenderness Neg SLR bilaterally. No pain with int/ext rotation at hip. Neg FABER. No pain at SIJ, GTB or sciatic notch bilaterally.  FROM at bilateral knees and ankles No pedal edema L calf circ 35cm R calf circ 34.5cm  Skin: Skin is warm and dry. No rash noted.  Nursing note and vitals reviewed.  Assessment & Plan:   Problem List Items Addressed This Visit    Lower back pain    Anticipate lumbar sprain - supportive care discussed, rec heating pad and rest. Out of work x 1 day, no heavy lifting x 1 wk. Update if not improved with treatment. OTC NSAID PRN.       Left leg pain - Primary    Most consistent with LLE cramping. Ok to continue magnesium, suggested potassium OTC x 1wk the PRN HCTZ use.           Follow up plan: Return if symptoms worsen or fail to improve.  Ria Bush, MD

## 2015-10-04 NOTE — Assessment & Plan Note (Signed)
Most consistent with LLE cramping. Ok to continue magnesium, suggested potassium OTC x 1wk the PRN HCTZ use.

## 2015-10-04 NOTE — Patient Instructions (Addendum)
This could be from low magnesium or low potassium - take water pill only as needed for swelling.  Ok to continue magnesium 250mg . May try extra potassium pill OTC for the next week to see if any improvement. Then just take whenever you take water pill.  Do exercises provided today.

## 2015-10-04 NOTE — Progress Notes (Signed)
Pre visit review using our clinic review tool, if applicable. No additional management support is needed unless otherwise documented below in the visit note. 

## 2015-10-28 ENCOUNTER — Ambulatory Visit (INDEPENDENT_AMBULATORY_CARE_PROVIDER_SITE_OTHER): Payer: Federal, State, Local not specified - PPO | Admitting: Family Medicine

## 2015-10-28 ENCOUNTER — Encounter: Payer: Self-pay | Admitting: Family Medicine

## 2015-10-28 DIAGNOSIS — F4323 Adjustment disorder with mixed anxiety and depressed mood: Secondary | ICD-10-CM

## 2015-10-28 MED ORDER — HYDROXYZINE HCL 25 MG PO TABS: 12.5000 mg | ORAL_TABLET | Freq: Two times a day (BID) | ORAL | 0 refills | Status: DC | PRN

## 2015-10-28 NOTE — Assessment & Plan Note (Addendum)
Discussed, support provided. rec she establish with EAP counselor at work. Pt states lorazepam sedating and not effective for sleep or anxiety - and not interested in daily anxiety medication. Discussed hydroxyzine vs klonopin - pt desires trial of hydroxyzine prn anxiety/sleep, discussed sedation precautions. Requests out of work note - I declined as she should be able to manage stressors while working especially if she takes advantage of resources available at work.

## 2015-10-28 NOTE — Progress Notes (Signed)
BP 128/68   Pulse 73   Temp 98.5 F (36.9 C) (Oral)   Wt 167 lb 8 oz (76 kg)   SpO2 98%   BMI 26.63 kg/m    CC: discuss anxiety Subjective:    Patient ID: Kendra Jackson, female    DOB: [birth year], 55 y.o.   MRN: [id number]  HPI: Shawnie Lobell is a 55 y.o. female presenting on 10/28/2015 for Anxiety   Weight gain noted - endorses stress eating.   Multiple family members who have died recently.  Aunt in [place] not doing well - she was recently able to go visit her.  Anniversaries for grandson's and mother's death.   Significant work related stress.  Works third shift - trouble sleeping due to this.  Planning on retiring as much as able.  Stress relieving strategies - listening to music, spending time with family and grandchild.  Has EAP counselor through work but has not been to him yet.   Rare lorazepam - last filled #30 12/2014. Not effective. Would like different anxiety medication.   Relevant past medical, surgical, family and social history reviewed and updated as indicated. Interim medical history since our last visit reviewed. Allergies and medications reviewed and updated. Current Outpatient Prescriptions on File Prior to Visit  Medication Sig  . aspirin 81 MG tablet Take 81 mg by mouth daily.  Marland Kitchen atenolol (TENORMIN) 50 MG tablet Take one tablet twice daily  . cetirizine (ZYRTEC) 10 MG tablet Take 1 tablet (10 mg total) by mouth daily.  . hydrochlorothiazide (MICROZIDE) 12.5 MG capsule TAKE 1 CAPSULE (12.5 MG TOTAL) BY MOUTH DAILY AS NEEDED (SWELLING).  . Magnesium Oxide 250 MG TABS Take by mouth. Take 1-2 by mouth daily as needed  . Misc Natural Products (APPLE CIDER VINEGAR) TABS Take by mouth daily.  . Multiple Vitamin (MULTIVITAMIN) tablet Take 1 tablet by mouth daily.  Marland Kitchen omeprazole (PRILOSEC) 40 MG capsule Take 1 capsule (40 mg total) by mouth daily.  Marland Kitchen triamcinolone cream (KENALOG) 0.1 % Apply 1 application topically 2 (two) times daily. Apply to AA.  .  valACYclovir (VALTREX) 500 MG tablet TAKE 1 TABLET (500 MG TOTAL) BY MOUTH 2 (TWO) TIMES DAILY. FOR 3 DAYS FOR OUTBREAK (6 PILLS TOTAL).   No current facility-administered medications on file prior to visit.     Review of Systems Per HPI unless specifically indicated in ROS section     Objective:    BP 128/68   Pulse 73   Temp 98.5 F (36.9 C) (Oral)   Wt 167 lb 8 oz (76 kg)   SpO2 98%   BMI 26.63 kg/m   Wt Readings from Last 3 Encounters:  10/28/15 167 lb 8 oz (76 kg)  10/04/15 163 lb 8 oz (74.2 kg)  08/25/15 160 lb 8 oz (72.8 kg)    Physical Exam  Constitutional: She appears well-developed and well-nourished. No distress.  Musculoskeletal: She exhibits no edema.  Psychiatric: Her speech is normal and behavior is normal. Thought content normal.  Tearful with discussion of stressors  Nursing note and vitals reviewed.     Assessment & Plan:   Problem List Items Addressed This Visit    Adjustment disorder with mixed anxiety and depressed mood    Discussed, support provided. rec she establish with EAP counselor at work. Pt states lorazepam sedating and not effective for sleep or anxiety - and not interested in daily anxiety medication. Discussed hydroxyzine vs klonopin - pt desires trial of hydroxyzine prn anxiety/sleep, discussed sedation  precautions. Requests out of work note - I declined as she should be able to manage stressors while working especially if she takes advantage of resources available at work.       Other Visit Diagnoses   None.      Follow up plan: No Follow-up on file.  Ria Bush, MD

## 2015-10-28 NOTE — Patient Instructions (Addendum)
I want you to contact your EAP counselor to discuss work stressors.  Work on [employer] trainer, increase exercise as stress relieving strategy. Try hydroxyzine 1/2 - 1 tablet as needed for sleep or anxiety. If even halp tablet is too sedating, let me know.  Good to see you today.

## 2015-10-28 NOTE — Progress Notes (Signed)
Pre visit review using our clinic review tool, if applicable. No additional management support is needed unless otherwise documented below in the visit note. 

## 2015-12-27 ENCOUNTER — Other Ambulatory Visit: Payer: Self-pay | Admitting: Family Medicine

## 2015-12-27 DIAGNOSIS — E0590 Thyrotoxicosis, unspecified without thyrotoxic crisis or storm: Secondary | ICD-10-CM

## 2015-12-27 DIAGNOSIS — E785 Hyperlipidemia, unspecified: Secondary | ICD-10-CM

## 2015-12-27 DIAGNOSIS — I10 Essential (primary) hypertension: Secondary | ICD-10-CM

## 2015-12-27 DIAGNOSIS — Z1159 Encounter for screening for other viral diseases: Secondary | ICD-10-CM

## 2015-12-29 ENCOUNTER — Other Ambulatory Visit (INDEPENDENT_AMBULATORY_CARE_PROVIDER_SITE_OTHER): Payer: Federal, State, Local not specified - PPO

## 2015-12-29 DIAGNOSIS — E0590 Thyrotoxicosis, unspecified without thyrotoxic crisis or storm: Secondary | ICD-10-CM

## 2015-12-29 DIAGNOSIS — I10 Essential (primary) hypertension: Secondary | ICD-10-CM

## 2015-12-29 DIAGNOSIS — E785 Hyperlipidemia, unspecified: Secondary | ICD-10-CM

## 2015-12-29 DIAGNOSIS — Z1159 Encounter for screening for other viral diseases: Secondary | ICD-10-CM | POA: Diagnosis not present

## 2015-12-29 LAB — LIPID PANEL
CHOL/HDL RATIO: 3
Cholesterol: 261 mg/dL — ABNORMAL HIGH (ref 0–200)
HDL: 81.8 mg/dL (ref >39.00)
LDL CALC: 158 mg/dL — AB (ref 0–99)
NONHDL: 179.23
Triglycerides: 106 mg/dL (ref 0.0–149.0)
VLDL: 21.2 mg/dL (ref 0.0–40.0)

## 2015-12-29 LAB — BASIC METABOLIC PANEL
BUN: 14 mg/dL (ref 6–23)
CALCIUM: 9.5 mg/dL (ref 8.4–10.5)
CO2: 31 mEq/L (ref 19–32)
Chloride: 103 mEq/L (ref 96–112)
Creatinine, Ser: 0.81 mg/dL (ref 0.40–1.20)
GFR: 93.67 mL/min (ref >60.00)
Glucose, Bld: 91 mg/dL (ref 70–99)
Potassium: 4 mEq/L (ref 3.5–5.1)
SODIUM: 141 meq/L (ref 135–145)

## 2015-12-29 LAB — MICROALBUMIN / CREATININE URINE RATIO
CREATININE, U: 131.3 mg/dL
MICROALB UR: 2.2 mg/dL — AB (ref 0.0–1.9)
Microalb Creat Ratio: 1.7 mg/g (ref 0.0–30.0)

## 2015-12-29 LAB — T4, FREE: Free T4: 0.71 ng/dL (ref 0.60–1.60)

## 2015-12-29 LAB — TSH: TSH: 0.8 u[IU]/mL (ref 0.35–4.50)

## 2015-12-30 ENCOUNTER — Ambulatory Visit (INDEPENDENT_AMBULATORY_CARE_PROVIDER_SITE_OTHER): Payer: Federal, State, Local not specified - PPO | Admitting: Internal Medicine

## 2015-12-30 ENCOUNTER — Encounter: Payer: Self-pay | Admitting: Internal Medicine

## 2015-12-30 VITALS — BP 132/88 | HR 73 | Wt 163.0 lb

## 2015-12-30 DIAGNOSIS — E0590 Thyrotoxicosis, unspecified without thyrotoxic crisis or storm: Secondary | ICD-10-CM

## 2015-12-30 DIAGNOSIS — E041 Nontoxic single thyroid nodule: Secondary | ICD-10-CM

## 2015-12-30 LAB — HEPATITIS C ANTIBODY: HCV Ab: NEGATIVE

## 2015-12-30 NOTE — Patient Instructions (Signed)
Please come back in 1 year. 

## 2015-12-30 NOTE — Progress Notes (Signed)
Patient ID: Kendra Jackson, female   DOB: 06-Jan-1961, 55 y.o.   MRN: [id number]   HPI  Kendra Jackson is a very pleasant 55 y.o.-year-old female, returning for f/u for subclinical hyperthyroidism and MNG. Last visit 1 year ago.  Reviewed and addended hx: Pt was told she had overactive thyroid ~10 years ago.   She also has small thyroid nodules, the dominant one (R lobe) was Bx'ed  In 2013 >> benign).   We repeated the thyroid ultrasound  On 01/06/2015: Right thyroid lobe: 4.9 x 1.5 x 1.5 cm. 1.0 x 0.8 x 1.0 cm solid nodule with calcifications inferior right thyroid lobe, previously 0.7 x 0.9 x 1.0 cm. Multiples small sub cm noncalcified nodules are noted scattered throughout the remainder of the right gland.  Left thyroid lobe: 5.2 x 1.0 x 1.4 cm. Linear echodensity, possibly calcific calcification left mid gland, unchanged. 0.6 cm solid nodule lower portion left gland.  Isthmus Thickness: 0.3 cm. No nodules visualized.  Lymphadenopathy: None visualized.    There was a slight increase in size of the R thyroid lobe nodule, however, not significant.  However, I suggested biopsy because of the presence of micro-calcifications.  A new thyroid FNA (09/20/2015) returned with benign results  I reviewed pt's thyroid tests -  In the last year:  Lab Results  Component Value Date   TSH 0.80 12/29/2015   TSH 0.62 12/24/2014   TSH 0.25 (L) 12/18/2013   TSH 0.19 (L) 07/23/2013   TSH 0.13 (L) 06/11/2013   TSH 0.28 (L) 12/10/2012   TSH 0.48 11/29/2011   TSH 0.91 12/06/2010   FREET4 0.71 12/29/2015   FREET4 0.70 12/24/2014   FREET4 0.80 12/18/2013   FREET4 0.77 07/23/2013   FREET4 0.67 06/11/2013   FREET4 0.81 12/10/2012    Pt denies feeling nodules in neck, hoarseness, dysphagia/odynophagia, SOB with lying down; she c/o: - + weight gain - no excessive sweating/heat intolerance  - no tremors - no anxiety - no palpitations - no fatigue - no hyperdefecation, had constipation "all my  life" - no hair loss - no dry skin  I reviewed pt's medications, allergies, PMH, social hx, family hx, and changes were documented in the history of present illness. Otherwise, unchanged from my initial visit note.   Her son was recently dx'ed with hyperthyroidism.  She retired in July 2017.   She takes Apple cider vinegar and Garcinia Cambodgia.  ROS: Constitutional: see HPI Eyes: no blurry vision, no xerophthalmia ENT: no sore throat, no nodules palpated in throat, no dysphagia/odynophagia, no hoarseness Cardiovascular: no CP/SOB/palpitations/leg swelling Respiratory: + cough/no SOB/wheezing Gastrointestinal: no N/V/D/+ C/+ heartburn Musculoskeletal: no muscle/joint aches Skin: no rashes Neurological: no tremors/numbness/tingling/dizziness  PE: BP 132/88 (BP Location: Left Arm, Patient Position: Sitting)   Pulse 73   Wt 163 lb (73.9 kg)   SpO2 99%   BMI 25.91 kg/m  Body mass index is 25.91 kg/m. Wt Readings from Last 3 Encounters:  12/30/15 163 lb (73.9 kg)  10/28/15 167 lb 8 oz (76 kg)  10/04/15 163 lb 8 oz (74.2 kg)   Constitutional: normal weight, in NAD, looking younger than stated age Eyes: PERRLA, EOMI, no exophthalmos, no lid lag, no stare ENT: moist mucous membranes, no thyromegaly, no cervical lymphadenopathy Cardiovascular: RRR, No MRG Respiratory: CTA B Gastrointestinal: abdomen soft, NT, ND, BS+ Musculoskeletal: no deformities, strength intact in all 4 Skin: moist, warm, no rashes Neurological: no tremor with outstretched hands, DTR normal in all 4  ASSESSMENT: 1. 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 Homogeneous background parenchyma. 7 x 9 x 10 mm solid with calcifications, inferior right (previously 7 x 8 x 10)   Left thyroid lobe: 53 x 11 x 16 mm. Homogeneous background  echotexture. At least 6 additional small bilateral nodules, 4 mm or less diameter.   Isthmus: 2.9 mm. No nodules visualized.   Lymphadenopathy None visualized. - 01/06/2015 Thyroid U/S:  Right thyroid lobe: 4.9 x 1.5 x 1.5 cm. 1.0 x 0.8 x 1.0 cm solid nodule with calcifications inferior right thyroid lobe, previously 0.7 x 0.9 x 1.0 cm. Multiples small sub cm noncalcified nodules are noted scattered throughout the remainder of the right gland.  Left thyroid lobe: 5.2 x 1.0 x 1.4 cm. Linear echodensity, possibly calcific calcification left mid gland, unchanged. 0.6 cm solid nodule lower portion left gland.  Isthmus Thickness: 0.3 cm. No nodules visualized.  Lymphadenopathy: None visualized -  09/20/2015 Thyroid FNA: benign results  PLAN:  1. Patient with a h/o subclinical hyperthyroidism, without thyrotoxic sxs. 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 MNG -  The thyroid ultrasound from one year ago showed slight increase in size of her right thyroid nodule, which contained  Micro-calcifications. We checked an FNA of this nodule and this returned benign.No further investigation is needed for now - no neck compression sxs  Philemon Kingdom, MD PhD [facility] Endocr[facility]

## 2016-01-02 ENCOUNTER — Ambulatory Visit (INDEPENDENT_AMBULATORY_CARE_PROVIDER_SITE_OTHER): Payer: Federal, State, Local not specified - PPO | Admitting: Family Medicine

## 2016-01-02 ENCOUNTER — Encounter: Payer: Self-pay | Admitting: Family Medicine

## 2016-01-02 VITALS — BP 126/78 | HR 70 | Temp 98.0°F | Ht 66.5 in | Wt 163.5 lb

## 2016-01-02 DIAGNOSIS — Z23 Encounter for immunization: Secondary | ICD-10-CM

## 2016-01-02 DIAGNOSIS — E0590 Thyrotoxicosis, unspecified without thyrotoxic crisis or storm: Secondary | ICD-10-CM

## 2016-01-02 DIAGNOSIS — K599 Functional intestinal disorder, unspecified: Secondary | ICD-10-CM | POA: Diagnosis not present

## 2016-01-02 DIAGNOSIS — I10 Essential (primary) hypertension: Secondary | ICD-10-CM

## 2016-01-02 DIAGNOSIS — Z0000 Encounter for general adult medical examination without abnormal findings: Secondary | ICD-10-CM | POA: Diagnosis not present

## 2016-01-02 DIAGNOSIS — E785 Hyperlipidemia, unspecified: Secondary | ICD-10-CM

## 2016-01-02 DIAGNOSIS — E042 Nontoxic multinodular goiter: Secondary | ICD-10-CM

## 2016-01-02 MED ORDER — RED YEAST RICE 600 MG PO CAPS: 1.0000 | ORAL_CAPSULE | Freq: Every day | ORAL | Status: DC

## 2016-01-02 NOTE — Assessment & Plan Note (Signed)
Preventative protocols reviewed and updated unless pt declined. Discussed healthy diet and lifestyle.  

## 2016-01-02 NOTE — Assessment & Plan Note (Addendum)
Chronic, stable. Continue current regimen. Uses hctz PRN

## 2016-01-02 NOTE — Assessment & Plan Note (Signed)
Followed by endo.  

## 2016-01-02 NOTE — Assessment & Plan Note (Signed)
Chronic, stable. High LDL likely familial but also with high HDL.  ASCVD 77yr risk = 5%, no need for Rx statin. Given fmhx, pt interested in RYR.

## 2016-01-02 NOTE — Assessment & Plan Note (Addendum)
Again recommended against regular laxative use. Suggested small dose miralax daily, good fiber and good water intake.

## 2016-01-02 NOTE — Assessment & Plan Note (Signed)
Followed by endo , appreciate care of patient.

## 2016-01-02 NOTE — Progress Notes (Signed)
Pre visit review using our clinic review tool, if applicable. No additional management support is needed unless otherwise documented below in the visit note. 

## 2016-01-02 NOTE — Patient Instructions (Addendum)
Flu shot today Miralax 1/2 capful daily with lots of water.  Consider red yeast rice supplement for lowering cholesterol  You are doing well today. Return as needed or in 1 year for next physical.  Menopause is a normal process in which your reproductive ability comes to an end. This process happens gradually over a span of months to years, usually between the ages of 75 and 45. Menopause is complete when you have missed 12 consecutive menstrual periods. It is important to talk with your health care provider about some of the most common conditions that affect postmenopausal women, such as heart disease, cancer, and bone loss (osteoporosis). Adopting a healthy lifestyle and getting preventive care can help to promote your health and wellness. Those actions can also lower your chances of developing some of these common conditions. WHAT SHOULD I KNOW ABOUT MENOPAUSE? During menopause, you may experience a number of symptoms, such as:  Moderate-to-severe hot flashes.  Night sweats.  Decrease in sex drive.  Mood swings.  Headaches.  Tiredness.  Irritability.  Memory problems.  Insomnia. Choosing to treat or not to treat menopausal changes is an individual decision that you make with your health care provider. WHAT SHOULD I KNOW ABOUT HORMONE REPLACEMENT THERAPY AND SUPPLEMENTS? Hormone therapy products are effective for treating symptoms that are associated with menopause, such as hot flashes and night sweats. Hormone replacement carries certain risks, especially as you become older. If you are thinking about using estrogen or estrogen with progestin treatments, discuss the benefits and risks with your health care provider. WHAT SHOULD I KNOW ABOUT HEART DISEASE AND STROKE? Heart disease, heart attack, and stroke become more likely as you age. This may be due, in part, to the hormonal changes that your body experiences during menopause. These can affect how your body processes dietary  fats, triglycerides, and cholesterol. Heart attack and stroke are both medical emergencies. There are many things that you can do to help prevent heart disease and stroke:  Have your blood pressure checked at least every 1-2 years. High blood pressure causes heart disease and increases the risk of stroke.  If you are 40-73 years old, ask your health care provider if you should take aspirin to prevent a heart attack or a stroke.  Do not use any tobacco products, including cigarettes, chewing tobacco, or electronic cigarettes. If you need help quitting, ask your health care provider.  It is important to eat a healthy diet and maintain a healthy weight.  Be sure to include plenty of vegetables, fruits, low-fat dairy products, and lean protein.  Avoid eating foods that are high in solid fats, added sugars, or salt (sodium).  Get regular exercise. This is one of the most important things that you can do for your health.  Try to exercise for at least 150 minutes each week. The type of exercise that you do should increase your heart rate and make you sweat. This is known as moderate-intensity exercise.  Try to do strengthening exercises at least twice each week. Do these in addition to the moderate-intensity exercise.  Know your numbers.Ask your health care provider to check your cholesterol and your blood glucose. Continue to have your blood tested as directed by your health care provider. WHAT SHOULD I KNOW ABOUT CANCER SCREENING? There are several types of cancer. Take the following steps to reduce your risk and to catch any cancer development as early as possible. Breast Cancer  Practice breast self-awareness.  This means understanding how your breasts  normally appear and feel.  It also means doing regular breast self-exams. Let your health care provider know about any changes, no matter how small.  If you are 11 or older, have a clinician do a breast exam (clinical breast exam or CBE)  every year. Depending on your age, family history, and medical history, it may be recommended that you also have a yearly breast X-ray (mammogram).  If you have a family history of breast cancer, talk with your health care provider about genetic screening.  If you are at high risk for breast cancer, talk with your health care provider about having an MRI and a mammogram every year.  Breast cancer (BRCA) gene test is recommended for women who have family members with BRCA-related cancers. Results of the assessment will determine the need for genetic counseling and BRCA1 and for BRCA2 testing. BRCA-related cancers include these types:  Breast. This occurs in males or females.  Ovarian.  Tubal. This may also be called fallopian tube cancer.  Cancer of the abdominal or pelvic lining (peritoneal cancer).  Prostate.  Pancreatic. Cervical, Uterine, and Ovarian Cancer Your health care provider may recommend that you be screened regularly for cancer of the pelvic organs. These include your ovaries, uterus, and vagina. This screening involves a pelvic exam, which includes checking for microscopic changes to the surface of your cervix (Pap test).  For women ages 21-65, health care providers may recommend a pelvic exam and a Pap test every three years. For women ages 61-65, they may recommend the Pap test and pelvic exam, combined with testing for human papilloma virus (HPV), every five years. Some types of HPV increase your risk of cervical cancer. Testing for HPV may also be done on women of any age who have unclear Pap test results.  Other health care providers may not recommend any screening for nonpregnant women who are considered low risk for pelvic cancer and have no symptoms. Ask your health care provider if a screening pelvic exam is right for you.  If you have had past treatment for cervical cancer or a condition that could lead to cancer, you need Pap tests and screening for cancer for at  least 20 years after your treatment. If Pap tests have been discontinued for you, your risk factors (such as having a new sexual partner) need to be reassessed to determine if you should start having screenings again. Some women have medical problems that increase the chance of getting cervical cancer. In these cases, your health care provider may recommend that you have screening and Pap tests more often.  If you have a family history of uterine cancer or ovarian cancer, talk with your health care provider about genetic screening.  If you have vaginal bleeding after reaching menopause, tell your health care provider.  There are currently no reliable tests available to screen for ovarian cancer. Lung Cancer Lung cancer screening is recommended for adults 47-27 years old who are at high risk for lung cancer because of a history of smoking. A yearly low-dose CT scan of the lungs is recommended if you:  Currently smoke.  Have a history of at least 30 pack-years of smoking and you currently smoke or have quit within the past 15 years. A pack-year is smoking an average of one pack of cigarettes per day for one year. Yearly screening should:  Continue until it has been 15 years since you quit.  Stop if you develop a health problem that would prevent you from having  lung cancer treatment. Colorectal Cancer  This type of cancer can be detected and can often be prevented.  Routine colorectal cancer screening usually begins at age 58 and continues through age 60.  If you have risk factors for colon cancer, your health care provider may recommend that you be screened at an earlier age.  If you have a family history of colorectal cancer, talk with your health care provider about genetic screening.  Your health care provider may also recommend using home test kits to check for hidden blood in your stool.  A small camera at the end of a tube can be used to examine your colon directly (sigmoidoscopy  or colonoscopy). This is done to check for the earliest forms of colorectal cancer.  Direct examination of the colon should be repeated every 5-10 years until age 59. However, if early forms of precancerous polyps or small growths are found or if you have a family history or genetic risk for colorectal cancer, you may need to be screened more often. Skin Cancer  Check your skin from head to toe regularly.  Monitor any moles. Be sure to tell your health care provider:  About any new moles or changes in moles, especially if there is a change in a mole's shape or color.  If you have a mole that is larger than the size of a pencil eraser.  If any of your family members has a history of skin cancer, especially at a young age, talk with your health care provider about genetic screening.  Always use sunscreen. Apply sunscreen liberally and repeatedly throughout the day.  Whenever you are outside, protect yourself by wearing long sleeves, pants, a wide-brimmed hat, and sunglasses. WHAT SHOULD I KNOW ABOUT OSTEOPOROSIS? Osteoporosis is a condition in which bone destruction happens more quickly than new bone creation. After menopause, you may be at an increased risk for osteoporosis. To help prevent osteoporosis or the bone fractures that can happen because of osteoporosis, the following is recommended:  If you are 67-85 years old, get at least 1,000 mg of calcium and at least 600 mg of vitamin D per day.  If you are older than age 12 but younger than age 50, get at least 1,200 mg of calcium and at least 600 mg of vitamin D per day.  If you are older than age 42, get at least 1,200 mg of calcium and at least 800 mg of vitamin D per day. Smoking and excessive alcohol intake increase the risk of osteoporosis. Eat foods that are rich in calcium and vitamin D, and do weight-bearing exercises several times each week as directed by your health care provider. WHAT SHOULD I KNOW ABOUT HOW MENOPAUSE AFFECTS  [place]? Depression may occur at any age, but it is more common as you become older. Common symptoms of depression include:  Low or sad mood.  Changes in sleep patterns.  Changes in appetite or eating patterns.  Feeling an overall lack of motivation or enjoyment of activities that you previously enjoyed.  Frequent crying spells. Talk with your health care provider if you think that you are experiencing depression. WHAT SHOULD I KNOW ABOUT IMMUNIZATIONS? It is important that you get and maintain your immunizations. These include:  Tetanus, diphtheria, and pertussis (Tdap) booster vaccine.  Influenza every year before the flu season begins.  Pneumonia vaccine.  Shingles vaccine. Your health care provider may also recommend other immunizations.   This information is not intended to replace advice given  to you by your health care provider. Make sure you discuss any questions you have with your health care provider.   Document Released: 05/04/2005 Document Revised: 04/02/2014 Document Reviewed: 11/12/2013 Elsevier Interactive Patient Education [employer].

## 2016-01-02 NOTE — Progress Notes (Signed)
BP 126/78   Pulse 70   Temp 98 F (36.7 C) (Oral)   Ht 5' 6.5" (1.689 m)   Wt 163 lb 8 oz (74.2 kg)   SpO2 98%   BMI 25.99 kg/m    CC: CPE Subjective:    Patient ID: Kendra Jackson, female    DOB: 01-18-1961, 55 y.o.   MRN: [id number]  HPI: Kendra Jackson is a 55 y.o. 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   Chronic constipation - doesn't have BM unless suppository or enema. Ex lax no longer works.   Stopped omeprazole. GERD controlled with tums and pepto bismol.   Preventative: COLONOSCOPY Date: 07/2011 hemorrhoids, rpt due 5 yrs given h/o polyps ([place]) Well woman - pap smear WNL 2016, remote h/o abnormal cells s/p cryotherapy.  LMP - 04/12/2009, no bleeding since then.  Mammogram - normal 01/2015 Flu shot - today  Tdap 2013  Seat belt use discussed Sunscreen use discussed. No changing moles on skin Non smoker Alcohol - rare  Caffeine: rare  Lives alone, no pets. Grown son 56yo, grandson died suddenly at 1yo 2010 (drowning) - lots of stress from this.  Occupation: Tour manager, works third shift  Activity: no regular exercise but bought treadmill  Diet: good water, fruits daily, vegetables occasionally   Relevant past medical, surgical, family and social history reviewed and updated as indicated. Interim medical history since our last visit reviewed. Allergies and medications reviewed and updated. Current Outpatient Prescriptions on File Prior to Visit  Medication Sig  . aspirin 81 MG tablet Take 81 mg by mouth daily.  Marland Kitchen atenolol (TENORMIN) 50 MG tablet Take one tablet twice daily  . cetirizine (ZYRTEC) 10 MG tablet Take 1 tablet (10 mg total) by mouth daily.  . Magnesium Oxide 250 MG TABS Take by mouth. Take 1-2 by mouth daily as needed  . Misc Natural Products (APPLE CIDER VINEGAR) TABS Take by mouth daily.  . Multiple Vitamin (MULTIVITAMIN) tablet  Take 1 tablet by mouth daily.  Marland Kitchen triamcinolone cream (KENALOG) 0.1 % Apply 1 application topically 2 (two) times daily. Apply to AA.  . valACYclovir (VALTREX) 500 MG tablet TAKE 1 TABLET (500 MG TOTAL) BY MOUTH 2 (TWO) TIMES DAILY. FOR 3 DAYS FOR OUTBREAK (6 PILLS TOTAL).  . hydrochlorothiazide (MICROZIDE) 12.5 MG capsule TAKE 1 CAPSULE (12.5 MG TOTAL) BY MOUTH DAILY AS NEEDED (SWELLING). (Patient not taking: Reported on 01/02/2016)  . hydrOXYzine (ATARAX/VISTARIL) 25 MG tablet Take 0.5-1 tablets (12.5-25 mg total) by mouth 2 (two) times daily as needed for anxiety (sedation precautions). (Patient not taking: Reported on 01/02/2016)   No current facility-administered medications on file prior to visit.     Review of Systems  Constitutional: Negative for activity change, appetite change, chills, fatigue, fever and unexpected weight change.  HENT: Negative for hearing loss.   Eyes: Negative for visual disturbance.  Respiratory: Positive for cough (occasional). Negative for chest tightness, shortness of breath and wheezing.   Cardiovascular: Negative for chest pain, palpitations and leg swelling.  Gastrointestinal: Positive for blood in stool, constipation, diarrhea, nausea and vomiting. Negative for abdominal distention and abdominal pain.       Viral gastroenteritis a few weeks ago  Genitourinary: Negative for difficulty urinating and hematuria.  Musculoskeletal: Negative for arthralgias, myalgias and neck pain.  Skin: Negative for rash.  Neurological: Negative for dizziness, seizures, syncope and headaches.  Hematological: Negative for  adenopathy. Does not bruise/bleed easily.  Psychiatric/Behavioral: Negative for dysphoric mood. The patient is not nervous/anxious.    Per HPI unless specifically indicated in ROS section     Objective:    BP 126/78   Pulse 70   Temp 98 F (36.7 C) (Oral)   Ht 5' 6.5" (1.689 m)   Wt 163 lb 8 oz (74.2 kg)   SpO2 98%   BMI 25.99 kg/m   Wt Readings  from Last 3 Encounters:  01/02/16 163 lb 8 oz (74.2 kg)  12/30/15 163 lb (73.9 kg)  10/28/15 167 lb 8 oz (76 kg)    Physical Exam  Constitutional: She is oriented to person, place, and time. She appears well-developed and well-nourished. No distress.  HENT:  Head: Normocephalic and atraumatic.  Right Ear: Hearing, tympanic membrane, external ear and ear canal normal.  Left Ear: Hearing, tympanic membrane, external ear and ear canal normal.  Nose: Nose normal.  Mouth/Throat: Uvula is midline, oropharynx is clear and moist and mucous membranes are normal. No oropharyngeal exudate, posterior oropharyngeal edema or posterior oropharyngeal erythema.  Eyes: Conjunctivae and EOM are normal. Pupils are equal, round, and reactive to light. No scleral icterus.  Neck: Normal range of motion. Neck supple. No thyromegaly present.  Cardiovascular: Normal rate, regular rhythm, normal heart sounds and intact distal pulses.   No murmur heard. Pulses:      Radial pulses are 2+ on the right side, and 2+ on the left side.  Pulmonary/Chest: Effort normal and breath sounds normal. No respiratory distress. She has no wheezes. She has no rales.  Abdominal: Soft. Bowel sounds are normal. She exhibits no distension and no mass. There is no tenderness. There is no rebound and no guarding.  Musculoskeletal: Normal range of motion. She exhibits no edema.  Lymphadenopathy:    She has no cervical adenopathy.  Neurological: She is alert and oriented to person, place, and time.  CN grossly intact, station and gait intact  Skin: Skin is warm and dry. No rash noted.  Psychiatric: She has a normal mood and affect. Her behavior is normal. Judgment and thought content normal.  Nursing note and vitals reviewed.  Results for orders placed or performed in visit on 12/29/15  Hepatitis C antibody  Result Value Ref Range   HCV Ab NEGATIVE NEGATIVE  TSH  Result Value Ref Range   TSH 0.80 0.35 - 4.50 uIU/mL  Lipid panel    Result Value Ref Range   Cholesterol 261 (H) 0 - 200 mg/dL   Triglycerides 106.0 0.0 - 149.0 mg/dL   HDL 81.80 >39.00 mg/dL   VLDL 21.2 0.0 - 40.0 mg/dL   LDL Cholesterol 158 (H) 0 - 99 mg/dL   Total CHOL/HDL Ratio 3    NonHDL 99991111   Basic metabolic panel  Result Value Ref Range   Sodium 141 135 - 145 mEq/L   Potassium 4.0 3.5 - 5.1 mEq/L   Chloride 103 96 - 112 mEq/L   CO2 31 19 - 32 mEq/L   Glucose, Bld 91 70 - 99 mg/dL   BUN 14 6 - 23 mg/dL   Creatinine, Ser 0.81 0.40 - 1.20 mg/dL   Calcium 9.5 8.4 - 10.5 mg/dL   GFR 93.67 >60.00 mL/min  T4, free  Result Value Ref Range   Free T4 0.71 0.60 - 1.60 ng/dL  Microalbumin / creatinine urine ratio  Result Value Ref Range   Microalb, Ur 2.2 (H) 0.0 - 1.9 mg/dL   Creatinine,U 131.3 mg/dL  Microalb Creat Ratio 1.7 0.0 - 30.0 mg/g      Assessment & Plan:   Problem List Items Addressed This Visit    Abnormal large bowel motility    Again recommended against regular laxative use. Suggested small dose miralax daily, good fiber and good water intake.      Healthcare maintenance - Primary    Preventative protocols reviewed and updated unless pt declined. Discussed healthy diet and lifestyle.       HLD (hyperlipidemia)    Chronic, stable. High LDL likely familial but also with high HDL.  ASCVD 9yr risk = 5%, no need for Rx statin. Given fmhx, pt interested in RYR.       HTN (hypertension)    Chronic, stable. Continue current regimen. Uses hctz PRN      Multinodular goiter    Followed by endo , appreciate care of patient.      Subclinical hyperthyroidism    Followed by endo.       Other Visit Diagnoses    Need for influenza vaccination       Relevant Orders   Flu Vaccine QUAD 36+ mos PF IM (Fluarix & Fluzone Quad PF)       Follow up plan: Return in about 1 year (around 01/01/2017), or as needed, for annual exam, prior fasting for blood work.  Ria Bush, MD

## 2016-01-06 ENCOUNTER — Other Ambulatory Visit: Payer: Self-pay | Admitting: Family Medicine

## 2016-01-16 ENCOUNTER — Other Ambulatory Visit: Payer: Self-pay | Admitting: Family Medicine

## 2016-01-16 DIAGNOSIS — Z1231 Encounter for screening mammogram for malignant neoplasm of breast: Secondary | ICD-10-CM

## 2016-02-22 ENCOUNTER — Ambulatory Visit
Admission: RE | Admit: 2016-02-22 | Discharge: 2016-02-22 | Disposition: A | Discharge status: Home / Self Care | Payer: Federal, State, Local not specified - PPO | Source: Ambulatory Visit | Attending: Family Medicine | Admitting: Family Medicine

## 2016-02-22 DIAGNOSIS — Z1231 Encounter for screening mammogram for malignant neoplasm of breast: Secondary | ICD-10-CM

## 2016-02-22 LAB — HM MAMMOGRAPHY

## 2016-02-23 ENCOUNTER — Encounter: Payer: Self-pay | Admitting: *Deleted

## 2016-03-08 ENCOUNTER — Encounter: Payer: Self-pay | Admitting: Family Medicine

## 2016-03-08 ENCOUNTER — Ambulatory Visit (INDEPENDENT_AMBULATORY_CARE_PROVIDER_SITE_OTHER): Payer: Federal, State, Local not specified - PPO | Admitting: Family Medicine

## 2016-03-08 VITALS — BP 130/86 | HR 84 | Temp 97.7°F | Wt 165.0 lb

## 2016-03-08 DIAGNOSIS — N898 Other specified noninflammatory disorders of vagina: Secondary | ICD-10-CM | POA: Insufficient documentation

## 2016-03-08 DIAGNOSIS — Z113 Encounter for screening for infections with a predominantly sexual mode of transmission: Secondary | ICD-10-CM

## 2016-03-08 DIAGNOSIS — Z7251 High risk heterosexual behavior: Secondary | ICD-10-CM | POA: Insufficient documentation

## 2016-03-08 DIAGNOSIS — R059 Cough, unspecified: Secondary | ICD-10-CM

## 2016-03-08 DIAGNOSIS — R05 Cough: Secondary | ICD-10-CM | POA: Diagnosis not present

## 2016-03-08 DIAGNOSIS — K2100 Gastro-esophageal reflux disease with esophagitis, without bleeding: Secondary | ICD-10-CM

## 2016-03-08 MED ORDER — HYDROCOD POLST-CPM POLST ER 10-8 MG/5ML PO SUER: 5.0000 mL | Freq: Two times a day (BID) | ORAL | 0 refills | Status: DC | PRN

## 2016-03-08 NOTE — Assessment & Plan Note (Signed)
Anticipate allergic/reflux related cough as no signs of bacterial infection today. rec omeprazole 40mg  daily as well as Rx tussionex for night time.  rec continue benadryl, consider daily zyrtec as well.  Update if not improved.

## 2016-03-08 NOTE — Patient Instructions (Signed)
Labs today. Wet prep sent today as well. I think cough is more allergy/reflux related. Start omeprazole 40mg  daily for 2 weeks as well as tussinex cough syrup as needed (mainly for night time as it can cause sleepiness).  Let us know if no better.

## 2016-03-08 NOTE — Progress Notes (Signed)
Pre visit review using our clinic review tool, if applicable. No additional management support is needed unless otherwise documented below in the visit note. 

## 2016-03-08 NOTE — Progress Notes (Signed)
BP 130/86   Pulse 84   Temp 97.7 F (36.5 C) (Oral)   Wt 165 lb (74.8 kg)   SpO2 98%   BMI 26.23 kg/m    CC: URI, ?BV Subjective:    Patient ID: Kendra Jackson, female    DOB: Sep 26, 1960, 55 y.o.   MRN: [id number]  HPI: Kendra Jackson is a 55 y.o. female presenting on 03/08/2016 for URI (cough, congestion) and ? BV (occasional foul odor; no itching, no burning; no discharge)   3 wk h/o cough, chest congestion - ongoing symptoms since Thanksgiving. Initially sore throat, nasal congestion, sneezing. Cough mildly productive of yellow sputum. No significant PNdrainage. Endorses 90% of time coughing associated with cough.   Taking proair PRN as well as alka seltzer plus tablets, OTC allergy medications, cough syrup.   No sick contacts at home. Non smoker.  No recent antibiotics.  Some GERD sxs. Takes benadryl daily.   Noticing foul odor after sex with certain partner. Has 2 partners currently, only uses condoms consistently with 1 partner. No vaginal discharge, itching, burning. Intermittently douches, last 1 month ago. Has had multiple partners in the past year, but consistent with 2. Has been using baking soda.   Relevant past medical, surgical, family and social history reviewed and updated as indicated. Interim medical history since our last visit reviewed. Allergies and medications reviewed and updated. Current Outpatient Prescriptions on File Prior to Visit  Medication Sig  . aspirin 81 MG tablet Take 81 mg by mouth daily.  Marland Kitchen atenolol (TENORMIN) 50 MG tablet TAKE ONE TABLET TWICE DAILY  . cetirizine (ZYRTEC) 10 MG tablet Take 1 tablet (10 mg total) by mouth daily.  Marland Kitchen GARCINIA CAMBOGIA-CHROMIUM PO Take 1 capsule by mouth daily.  . Magnesium Oxide 250 MG TABS Take by mouth. Take 1-2 by mouth daily as needed  . Misc Natural Products (APPLE CIDER VINEGAR) TABS Take by mouth daily.  . Multiple Vitamin (MULTIVITAMIN) tablet Take 1 tablet by mouth daily.  . polyethylene glycol  powder (MIRALAX) powder Take 8.5 g by mouth daily as needed.  . Red Yeast Rice 600 MG CAPS Take 1 capsule (600 mg total) by mouth daily.  . valACYclovir (VALTREX) 500 MG tablet TAKE 1 TABLET (500 MG TOTAL) BY MOUTH 2 (TWO) TIMES DAILY. FOR 3 DAYS FOR OUTBREAK (6 PILLS TOTAL).  . hydrochlorothiazide (MICROZIDE) 12.5 MG capsule TAKE 1 CAPSULE (12.5 MG TOTAL) BY MOUTH DAILY AS NEEDED (SWELLING). (Patient not taking: Reported on 03/08/2016)  . triamcinolone cream (KENALOG) 0.1 % Apply 1 application topically 2 (two) times daily. Apply to AA. (Patient not taking: Reported on 03/08/2016)   No current facility-administered medications on file prior to visit.     Review of Systems Per HPI unless specifically indicated in ROS section     Objective:    BP 130/86   Pulse 84   Temp 97.7 F (36.5 C) (Oral)   Wt 165 lb (74.8 kg)   SpO2 98%   BMI 26.23 kg/m   Wt Readings from Last 3 Encounters:  03/08/16 165 lb (74.8 kg)  01/02/16 163 lb 8 oz (74.2 kg)  12/30/15 163 lb (73.9 kg)    Physical Exam  Constitutional: She appears well-developed and well-nourished. No distress.  HENT:  Head: Normocephalic and atraumatic.  Right Ear: Hearing, tympanic membrane, external ear and ear canal normal.  Left Ear: Hearing, tympanic membrane, external ear and ear canal normal.  Nose: No mucosal edema or rhinorrhea. Right sinus exhibits no maxillary sinus tenderness  and no frontal sinus tenderness. Left sinus exhibits no maxillary sinus tenderness and no frontal sinus tenderness.  Mouth/Throat: Uvula is midline, oropharynx is clear and moist and mucous membranes are normal. No oropharyngeal exudate, posterior oropharyngeal edema, posterior oropharyngeal erythema or tonsillar abscesses.  Eyes: Conjunctivae and EOM are normal. Pupils are equal, round, and reactive to light. No scleral icterus.  Neck: Normal range of motion. Neck supple.  Cardiovascular: Normal rate, regular rhythm, normal heart sounds and intact  distal pulses.   No murmur heard. Pulmonary/Chest: Effort normal and breath sounds normal. No respiratory distress. She has no wheezes. She has no rales.  Genitourinary: Vagina normal. Pelvic exam was performed with patient supine. There is no rash, tenderness or lesion on the right labia. There is no rash, tenderness or lesion on the left labia. Cervix exhibits no discharge and no friability. Right adnexum displays no tenderness.  Genitourinary Comments: CT/GC and wet prep collected  Lymphadenopathy:    She has no cervical adenopathy.  Skin: Skin is warm and dry. No rash noted.  Nursing note and vitals reviewed.  Results for orders placed or performed in visit on 02/23/16  [facility] MAMMOGRAPHY  Result Value Ref Range   HM Mammogram 0-4 Bi-Rad 0-4 Bi-Rad, Self Reported Normal      Assessment & Plan:   Problem List Items Addressed This Visit    Cough - Primary    Anticipate allergic/reflux related cough as no signs of bacterial infection today. rec omeprazole 40mg  daily as well as Rx tussionex for night time.  rec continue benadryl, consider daily zyrtec as well.  Update if not improved.       Reflux esophagitis   Vaginal discharge    High risk sexual activity - check STD screen today.  Discharge noted on exam - wet prep, CT/GC sent today.  Will update with results and treat accordingly.       Relevant Orders   WET PREP BY MOLECULAR PROBE    Other Visit Diagnoses    Screen for STD (sexually transmitted disease)       Relevant Orders   GC/Chlamydia Probe Amp   HIV antibody   RPR       Follow up plan: Return if symptoms worsen or fail to improve.  Ria Bush, MD

## 2016-03-08 NOTE — Assessment & Plan Note (Signed)
High risk sexual activity - check STD screen today.  Discharge noted on exam - wet prep, CT/GC sent today.  Will update with results and treat accordingly.

## 2016-03-09 LAB — GC/CHLAMYDIA PROBE AMP
CT PROBE, AMP APTIMA: NOT DETECTED
GC PROBE AMP APTIMA: NOT DETECTED

## 2016-03-09 LAB — WET PREP BY MOLECULAR PROBE
Candida species: NEGATIVE
Gardnerella vaginalis: POSITIVE — AB
TRICHOMONAS VAG: NEGATIVE

## 2016-03-09 LAB — RPR

## 2016-03-09 LAB — HIV ANTIBODY (ROUTINE TESTING W REFLEX): HIV 1&2 Ab, 4th Generation: NONREACTIVE

## 2016-03-12 ENCOUNTER — Other Ambulatory Visit: Payer: Self-pay | Admitting: Family Medicine

## 2016-03-12 MED ORDER — METRONIDAZOLE 500 MG PO TABS: 500.0000 mg | ORAL_TABLET | Freq: Two times a day (BID) | ORAL | 0 refills | Status: DC

## 2016-05-08 ENCOUNTER — Ambulatory Visit (INDEPENDENT_AMBULATORY_CARE_PROVIDER_SITE_OTHER): Payer: Federal, State, Local not specified - PPO | Admitting: Family Medicine

## 2016-05-08 ENCOUNTER — Encounter: Payer: Self-pay | Admitting: Family Medicine

## 2016-05-08 VITALS — BP 158/90 | HR 74 | Temp 98.1°F | Wt 166.0 lb

## 2016-05-08 DIAGNOSIS — Z86010 Personal history of colonic polyps: Secondary | ICD-10-CM

## 2016-05-08 DIAGNOSIS — I10 Essential (primary) hypertension: Secondary | ICD-10-CM | POA: Diagnosis not present

## 2016-05-08 DIAGNOSIS — J069 Acute upper respiratory infection, unspecified: Secondary | ICD-10-CM | POA: Diagnosis not present

## 2016-05-08 MED ORDER — HYDROCOD POLST-CPM POLST ER 10-8 MG/5ML PO SUER: 5.0000 mL | Freq: Every evening | ORAL | 0 refills | Status: DC | PRN

## 2016-05-08 MED ORDER — VALACYCLOVIR HCL 500 MG PO TABS: ORAL_TABLET | ORAL | 0 refills | Status: DC

## 2016-05-08 NOTE — Assessment & Plan Note (Signed)
Refer back to GI - will be due for rpt colonoscopy 07/2016.

## 2016-05-08 NOTE — Progress Notes (Signed)
Pre visit review using our clinic review tool, if applicable. No additional management support is needed unless otherwise documented below in the visit note. 

## 2016-05-08 NOTE — Assessment & Plan Note (Addendum)
bp elevated today - pt did not take am meds. Advised continue to monitor, no changes today.

## 2016-05-08 NOTE — Patient Instructions (Signed)
Blood pressure is elevated today - keep an eye on this. We will refer you for colonoscopy 07/2016.  You have a viral upper respiratory infection. Antibiotics are not needed for this.  Viral infections usually take 7-10 days to resolve.  The cough can last a few weeks to go away. Use medication as prescribed: tussionex for cough  Continue mucinex, zycam, nasal saline and nasacrom, Push fluids and plenty of rest. Please return if you are not improving as expected, or if you have high fevers (>101.5) or difficulty swallowing or worsening productive cough. Call clinic with questions.  Good to see you today. I hope you start feeling better soon.

## 2016-05-08 NOTE — Progress Notes (Signed)
BP (!) 158/90 (BP Location: Right Arm, Cuff Size: Normal)   Pulse 74   Temp 98.1 F (36.7 C) (Oral)   Wt 166 lb (75.3 kg)   SpO2 98%   BMI 26.39 kg/m    CC: URI Subjective:    Patient ID: Kendra Jackson, female    DOB: 25-Jun-1960, 56 y.o.   MRN: [id number]  HPI: Kendra Jackson is a 56 y.o. female presenting on 05/08/2016 for URI (cough, wheezing, chest congestion; off and on x2 weeks)   Several week h/o nasal congestion, productive cough of colored sputum, then a few days ago increased fatigue and weakness with loss of appetite. Some wheezing.   Taking OTC remedies and albuterol previously prescribed  No fevers/chills, body aches, headache, ear or tooth pain, dyspnea.   No sick contacts at home.  No h/o asthma. Non smoker  Requests colonoscopy referral.   Did not take bp med yet this morning.  BP Readings from Last 3 Encounters:  05/08/16 (!) 158/90  03/08/16 130/86  01/02/16 126/78     Relevant past medical, surgical, family and social history reviewed and updated as indicated. Interim medical history since our last visit reviewed. Allergies and medications reviewed and updated. Current Outpatient Prescriptions on File Prior to Visit  Medication Sig  . Ascorbic Acid (VITAMIN C) 1000 MG tablet Take 1,000 mg by mouth daily.  Marland Kitchen aspirin 81 MG tablet Take 81 mg by mouth daily.  Marland Kitchen atenolol (TENORMIN) 50 MG tablet TAKE ONE TABLET TWICE DAILY  . cetirizine (ZYRTEC) 10 MG tablet Take 1 tablet (10 mg total) by mouth daily.  . diphenhydrAMINE (BENADRYL) 25 MG tablet Take 25 mg by mouth at bedtime as needed.  Marland Kitchen GARCINIA CAMBOGIA-CHROMIUM PO Take 1 capsule by mouth daily.  . Magnesium Oxide 250 MG TABS Take by mouth. Take 1-2 by mouth daily as needed  . Misc Natural Products (APPLE CIDER VINEGAR) TABS Take by mouth daily.  . Multiple Vitamin (MULTIVITAMIN) tablet Take 1 tablet by mouth daily.  Marland Kitchen omeprazole (PRILOSEC) 40 MG capsule Take 40 mg by mouth daily as needed (GERD).  .  polyethylene glycol powder (MIRALAX) powder Take 8.5 g by mouth daily as needed.  . Red Yeast Rice 600 MG CAPS Take 1 capsule (600 mg total) by mouth daily.  Marland Kitchen triamcinolone cream (KENALOG) 0.1 % Apply 1 application topically 2 (two) times daily. Apply to AA.   No current facility-administered medications on file prior to visit.     Review of Systems Per HPI unless specifically indicated in ROS section     Objective:    BP (!) 158/90 (BP Location: Right Arm, Cuff Size: Normal)   Pulse 74   Temp 98.1 F (36.7 C) (Oral)   Wt 166 lb (75.3 kg)   SpO2 98%   BMI 26.39 kg/m   Wt Readings from Last 3 Encounters:  05/08/16 166 lb (75.3 kg)  03/08/16 165 lb (74.8 kg)  01/02/16 163 lb 8 oz (74.2 kg)    Physical Exam  Constitutional: She appears well-developed and well-nourished. No distress.  HENT:  Head: Normocephalic and atraumatic.  Right Ear: Hearing, tympanic membrane, external ear and ear canal normal.  Left Ear: Hearing, tympanic membrane, external ear and ear canal normal.  Nose: Mucosal edema (nasal irritation) and rhinorrhea present. Right sinus exhibits no maxillary sinus tenderness and no frontal sinus tenderness. Left sinus exhibits no maxillary sinus tenderness and no frontal sinus tenderness.  Mouth/Throat: Uvula is midline, oropharynx is clear and moist and  mucous membranes are normal. No oropharyngeal exudate, posterior oropharyngeal edema, posterior oropharyngeal erythema or tonsillar abscesses.  Eyes: Conjunctivae and EOM are normal. Pupils are equal, round, and reactive to light. No scleral icterus.  Neck: Normal range of motion. Neck supple.  Cardiovascular: Normal rate, regular rhythm, normal heart sounds and intact distal pulses.   No murmur heard. Pulmonary/Chest: Effort normal and breath sounds normal. No respiratory distress. She has no wheezes. She has no rales.  Lymphadenopathy:    She has no cervical adenopathy.  Skin: Skin is warm and dry. No rash noted.    Nursing note and vitals reviewed.      Assessment & Plan:   Problem List Items Addressed This Visit    History of colon polyps    Refer back to GI - will be due for rpt colonoscopy 07/2016.       Relevant Orders   Ambulatory referral to Gastroenterology   HTN (hypertension)    bp elevated today - pt did not take am meds. Advised continue to monitor, no changes today.       Upper respiratory infection, acute - Primary    Anticipate viral given improving on its own.  Supportive care reviewed.  tussionex cough syrup.  Reviewed OTC remedies      Relevant Medications   valACYclovir (VALTREX) 500 MG tablet       Follow up plan: Return if symptoms worsen or fail to improve.  Ria Bush, MD

## 2016-05-08 NOTE — Assessment & Plan Note (Signed)
Anticipate viral given improving on its own.  Supportive care reviewed.  tussionex cough syrup.  Reviewed OTC remedies

## 2016-05-15 ENCOUNTER — Telehealth: Payer: Self-pay

## 2016-05-15 ENCOUNTER — Other Ambulatory Visit: Payer: Self-pay

## 2016-05-15 NOTE — Telephone Encounter (Signed)
Gastroenterology Pre-Procedure Review  Request Date:  Requesting Physician: Dr.   PATIENT REVIEW QUESTIONS: The patient responded to the following health history questions as indicated:    1. Are you having any GI issues? no 2. Do you have a personal history of Polyps? yes (yes) 3. Do you have a family history of Colon Cancer or Polyps? yes (grandmother at age 58) 39. Diabetes Mellitus? no 5. Joint replacements in the past 12 months?no 6. Major health problems in the past 3 months?no 7. Any artificial heart valves, MVP, or defibrillator?no    MEDICATIONS & ALLERGIES:    Patient reports the following regarding taking any anticoagulation/antiplatelet therapy:   Plavix, Coumadin, Eliquis, Xarelto, Lovenox, Pradaxa, Brilinta, or Effient? no Aspirin? yes (ASA 81mg )  Patient confirms/reports the following medications:  Current Outpatient Prescriptions  Medication Sig Dispense Refill  . Ascorbic Acid (VITAMIN C) 1000 MG tablet Take 1,000 mg by mouth daily.    Marland Kitchen aspirin 81 MG tablet Take 81 mg by mouth daily.    Marland Kitchen atenolol (TENORMIN) 50 MG tablet TAKE ONE TABLET TWICE DAILY 60 tablet 11  . cetirizine (ZYRTEC) 10 MG tablet Take 1 tablet (10 mg total) by mouth daily. 30 tablet 1  . chlorpheniramine-HYDROcodone (TUSSIONEX PENNKINETIC ER) 10-8 MG/5ML SUER Take 5 mLs by mouth at bedtime as needed for cough. 120 mL 0  . diphenhydrAMINE (BENADRYL) 25 MG tablet Take 25 mg by mouth at bedtime as needed.    Marland Kitchen GARCINIA CAMBOGIA-CHROMIUM PO Take 1 capsule by mouth daily.    . Magnesium Oxide 250 MG TABS Take by mouth. Take 1-2 by mouth daily as needed    . Misc Natural Products (APPLE CIDER VINEGAR) TABS Take by mouth daily.    . Multiple Vitamin (MULTIVITAMIN) tablet Take 1 tablet by mouth daily.    Marland Kitchen omeprazole (PRILOSEC) 40 MG capsule Take 40 mg by mouth daily as needed (GERD).    . polyethylene glycol powder (MIRALAX) powder Take 8.5 g by mouth daily as needed.    . Red Yeast Rice 600 MG CAPS Take  1 capsule (600 mg total) by mouth daily.    Marland Kitchen triamcinolone cream (KENALOG) 0.1 % Apply 1 application topically 2 (two) times daily. Apply to AA. 30 g 0  . valACYclovir (VALTREX) 500 MG tablet TAKE 1 TABLET (500 MG TOTAL) BY MOUTH 2 (TWO) TIMES DAILY. FOR 3 DAYS FOR OUTBREAK (6 PILLS TOTAL). 24 tablet 0   No current facility-administered medications for this visit.     Patient confirms/reports the following allergies:  Allergies  Allergen Reactions  . Neosporin [Neomycin-Bacitracin Zn-Polymyx] Rash    No orders of the defined types were placed in this encounter.   AUTHORIZATION INFORMATION Primary Insurance: 1D#: Group #:  Secondary Insurance: 1D#: Group #:  SCHEDULE INFORMATION: Date: 05/29/16 Time: Location: [place]

## 2016-05-29 ENCOUNTER — Encounter
Admission: RE | Disposition: A | Discharge status: Home / Self Care | Payer: Self-pay | Source: Ambulatory Visit | Attending: Gastroenterology

## 2016-05-29 ENCOUNTER — Ambulatory Visit: Payer: Federal, State, Local not specified - PPO | Admitting: Anesthesiology

## 2016-05-29 ENCOUNTER — Ambulatory Visit
Admission: RE | Admit: 2016-05-29 | Discharge: 2016-05-29 | Disposition: A | Discharge status: Home / Self Care | Payer: Federal, State, Local not specified - PPO | Source: Ambulatory Visit | Attending: Gastroenterology | Admitting: Gastroenterology

## 2016-05-29 DIAGNOSIS — K589 Irritable bowel syndrome without diarrhea: Secondary | ICD-10-CM | POA: Insufficient documentation

## 2016-05-29 DIAGNOSIS — F329 Major depressive disorder, single episode, unspecified: Secondary | ICD-10-CM | POA: Diagnosis not present

## 2016-05-29 DIAGNOSIS — E785 Hyperlipidemia, unspecified: Secondary | ICD-10-CM | POA: Insufficient documentation

## 2016-05-29 DIAGNOSIS — Z1211 Encounter for screening for malignant neoplasm of colon: Secondary | ICD-10-CM | POA: Diagnosis not present

## 2016-05-29 DIAGNOSIS — D120 Benign neoplasm of cecum: Secondary | ICD-10-CM | POA: Diagnosis not present

## 2016-05-29 DIAGNOSIS — Z86010 Personal history of colonic polyps: Secondary | ICD-10-CM | POA: Diagnosis not present

## 2016-05-29 DIAGNOSIS — D573 Sickle-cell trait: Secondary | ICD-10-CM | POA: Diagnosis not present

## 2016-05-29 DIAGNOSIS — K648 Other hemorrhoids: Secondary | ICD-10-CM | POA: Diagnosis not present

## 2016-05-29 DIAGNOSIS — I10 Essential (primary) hypertension: Secondary | ICD-10-CM | POA: Diagnosis not present

## 2016-05-29 DIAGNOSIS — K640 First degree hemorrhoids: Secondary | ICD-10-CM

## 2016-05-29 DIAGNOSIS — E0590 Thyrotoxicosis, unspecified without thyrotoxic crisis or storm: Secondary | ICD-10-CM | POA: Diagnosis not present

## 2016-05-29 DIAGNOSIS — F419 Anxiety disorder, unspecified: Secondary | ICD-10-CM | POA: Insufficient documentation

## 2016-05-29 DIAGNOSIS — Z7982 Long term (current) use of aspirin: Secondary | ICD-10-CM | POA: Diagnosis not present

## 2016-05-29 HISTORY — PX: COLONOSCOPY WITH PROPOFOL: SHX5780

## 2016-05-29 SURGERY — COLONOSCOPY WITH PROPOFOL
Anesthesia: General

## 2016-05-29 MED ORDER — PHENYLEPHRINE HCL 10 MG/ML IJ SOLN
INTRAMUSCULAR | Status: DC | PRN
  Administered 2016-05-29: 100 ug via INTRAVENOUS

## 2016-05-29 MED ORDER — PROPOFOL 500 MG/50ML IV EMUL
INTRAVENOUS | Status: AC
  Filled 2016-05-29: qty 50

## 2016-05-29 MED ORDER — SODIUM CHLORIDE 0.9 % IV SOLN
INTRAVENOUS | Status: DC
  Administered 2016-05-29 (×2): via INTRAVENOUS

## 2016-05-29 MED ORDER — PROPOFOL 500 MG/50ML IV EMUL
INTRAVENOUS | Status: DC | PRN
  Administered 2016-05-29: 175 ug/kg/min via INTRAVENOUS

## 2016-05-29 MED ORDER — PROPOFOL 10 MG/ML IV BOLUS
INTRAVENOUS | Status: DC | PRN
  Administered 2016-05-29: 90 mg via INTRAVENOUS

## 2016-05-29 NOTE — Op Note (Addendum)
[facility] Gastroenterology Patient Name: Kendra Jackson Procedure Date: 05/29/2016 9:34 AM MRN: [id number] Account #: [id number] Date of Birth: 08-Feb-1961 Admit Type: Outpatient Age: 56 Room: [facility] ENDO ROOM 3 Gender: Female Note Status: Finalized Procedure:            Colonoscopy Indications:          High risk colon cancer surveillance: Personal history                        of colonic polyps, Last colonoscopy: May 2013 Providers:            Jonathon Bellows MD, MD Referring MD:         Ria Bush (Referring MD) Medicines:            Monitored Anesthesia Care Complications:        No immediate complications. Procedure:            Pre-Anesthesia Assessment:                       - Prior to the procedure, a History and Physical was                        performed, and patient medications, allergies and                        sensitivities were reviewed. The patient's tolerance of                        previous anesthesia was reviewed.                       - The risks and benefits of the procedure and the                        sedation options and risks were discussed with the                        patient. All questions were answered and informed                        consent was obtained.                       - ASA Grade Assessment: II - A patient with mild                        systemic disease.                       After obtaining informed consent, the colonoscope was                        passed under direct vision. Throughout the procedure,                        the patient's blood pressure, pulse, and oxygen                        saturations were monitored continuously. The  Colonoscope was introduced through the anus and                        advanced to the the cecum, identified by the                        appendiceal orifice, IC valve and transillumination.                        The colonoscopy was performed with  ease. The patient                        tolerated the procedure well. The quality of the bowel                        preparation was excellent. Findings:      The perianal and digital rectal examinations were normal.      Non-bleeding internal hemorrhoids were found during retroflexion. The       hemorrhoids were small and Grade I (internal hemorrhoids that do not       prolapse).      A 3 mm polyp was found in the cecum. The polyp was sessile. The polyp       was removed with a cold biopsy forceps. Resection and retrieval were       complete.      The exam was otherwise without abnormality on direct and retroflexion       views. Impression:           - Non-bleeding internal hemorrhoids.                       - One 3 mm polyp in the cecum, removed with a cold                        biopsy forceps. Resected and retrieved.                       - The examination was otherwise normal on direct and                        retroflexion views. Recommendation:       - Discharge patient to home (with escort).                       - Resume previous diet.                       - Continue present medications.                       - Await pathology results.                       - Repeat colonoscopy in 5 years for surveillance. Procedure Code(s):    --- Professional ---                       [phone], Colonoscopy, flexible; with biopsy, single or                        multiple Diagnosis Code(s):    --- Professional ---  Z86.010, Personal history of colonic polyps                       K64.0, First degree hemorrhoids                       D12.0, Benign neoplasm of cecum CPT copyright 2016 American Medical Association. All rights reserved. The codes documented in this report are preliminary and upon coder review may  be revised to meet current compliance requirements. Jonathon Bellows, MD Jonathon Bellows MD, MD 05/29/2016 10:01:02 AM This report has been signed electronically. Number  of Addenda: 0 Note Initiated On: 05/29/2016 9:34 AM Scope Withdrawal Time: 0 hours 14 minutes 50 seconds  Total Procedure Duration: 0 hours 18 minutes 57 seconds       [facility]

## 2016-05-29 NOTE — Anesthesia Post-op Follow-up Note (Cosign Needed)
Anesthesia QCDR form completed.        

## 2016-05-29 NOTE — Anesthesia Preprocedure Evaluation (Signed)
Anesthesia Evaluation  Patient identified by MRN, date of birth, ID band Patient awake    Reviewed: Allergy & Precautions, NPO status , Patient's Chart, lab work & pertinent test results  History of Anesthesia Complications Negative for: history of anesthetic complications  Airway Mallampati: II  TM Distance: >3 FB Neck ROM: Full    Dental  (+) Missing,    Pulmonary neg pulmonary ROS, neg sleep apnea, neg COPD,    breath sounds clear to auscultation- rhonchi (-) wheezing      Cardiovascular Exercise Tolerance: Good hypertension, Pt. 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 Kellie Moor No date: Constipation     Comment: severe requiring laxatives, failed miralax No date: Enlarged thyroid     Comment: h/o abnl labs but never treated No date: Family history of breast cancer in first degre* No date: History of colon polyps     Comment: hyperplastic polyp 2008, 2013 hemorrhoids, rec              rpt 5 yrs No date: History of herpes genitalis     Comment: valtrex prn No date: HLD (hyperlipidemia) No date: HTN (hypertension) No date: IBS (irritable bowel syndrome) No date: Reflux esophagitis     Comment: PPI No  date: Sickle cell trait ([place]) 12/22/2012: Subclinical hyperthyroidism     Comment: yearly visit with endo 06/2011: Thyroid nodule     Comment: R nodule, FNA biopsy = benign follicular,               stable [place] (12/2011 and 12/2012)   Reproductive/Obstetrics                             Anesthesia Physical Anesthesia Plan  ASA: II  Anesthesia Plan: General   Post-op Pain Management:    Induction: Intravenous  Airway Management Planned: Natural Airway  Additional Equipment:   Intra-op Plan:   Post-operative Plan:   Informed Consent: I have reviewed the patients History and Physical, chart, labs and discussed the procedure including the risks, benefits and alternatives for the proposed anesthesia with the patient or authorized representative who has indicated his/her understanding and acceptance.   Dental advisory given  Plan Discussed with: CRNA and Anesthesiologist  Anesthesia Plan Comments:         Anesthesia Quick Evaluation

## 2016-05-29 NOTE — H&P (Signed)
Jonathon Bellows MD [address], [place], [place] [address] Phone: [phone] Fax : [phone]  Primary Care Physician:  Ria Bush, MD Primary Gastroenterologist:  Dr. Jonathon Bellows   Pre-Procedure History & Physical: HPI:  Kendra Jackson is a 56 y.o. female is here for an colonoscopy.   Past Medical History:  Diagnosis Date  . 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 Abnormal pigmentation of skin 2004   L ear antihelix - bx consistent with SK Sharlett Iles), recheck stable per Dr. Kellie Moor  . Constipation    severe requiring laxatives, failed miralax  . Enlarged thyroid    h/o abnl labs but never treated  . Family history of breast cancer in first degree relative   . History of colon polyps    hyperplastic polyp 2008, 2013 hemorrhoids, rec rpt 5 yrs  . History of herpes genitalis    valtrex prn  . HLD (hyperlipidemia)   . HTN (hypertension)   . IBS (irritable bowel syndrome)   . Reflux esophagitis    PPI  . Sickle cell trait ([place])   . 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 Thyroid nodule 06/2011   R nodule, FNA biopsy = benign follicular, stable [place] (0000000 and 12/2012)    Past Surgical History:  Procedure Laterality Date  . COLONOSCOPY  07/2011   hemorrhoids, rpt due 5 yrs given h/o polyps  . thyroid US  12/2011   1cm solid nodule lower R pole (stable), no significant nodule on left side, rec rpt [place] in 1 yr  . TVS  11/2008   multiple uterine fibroids    Prior to Admission medications   Medication Sig Start Date End Date Taking? Authorizing Provider  atenolol (TENORMIN) 50 MG tablet TAKE ONE TABLET TWICE DAILY 01/06/16  Yes Ria Bush, MD  omeprazole (PRILOSEC) 40 MG capsule Take 40 mg by mouth daily as needed (GERD).   Yes Historical Provider, MD  valACYclovir (VALTREX) 500 MG tablet TAKE 1 TABLET (500 MG TOTAL) BY MOUTH 2 (TWO) TIMES DAILY. FOR 3 DAYS FOR OUTBREAK (6 PILLS TOTAL).  05/08/16  Yes Ria Bush, MD  Ascorbic Acid (VITAMIN C) 1000 MG tablet Take 1,000 mg by mouth daily.    Historical Provider, MD  aspirin 81 MG tablet Take 81 mg by mouth daily.    Historical Provider, MD  cetirizine (ZYRTEC) 10 MG tablet Take 1 tablet (10 mg total) by mouth daily. 06/20/14   Billy Fischer, MD  chlorpheniramine-HYDROcodone (TUSSIONEX PENNKINETIC ER) 10-8 MG/5ML SUER Take 5 mLs by mouth at bedtime as needed for cough. 05/08/16   Ria Bush, MD  diphenhydrAMINE (BENADRYL) 25 MG tablet Take 25 mg by mouth at bedtime as needed.    Historical Provider, MD  GARCINIA [place]BOGIA-CHROMIUM PO Take 1 capsule by mouth daily.    Historical Provider, MD  Magnesium Oxide 250 MG TABS Take by mouth. Take 1-2 by mouth daily as needed    Historical Provider, MD  Misc Natural Products (APPLE CIDER VINEGAR) TABS Take by mouth daily.    Historical Provider, MD  Multiple Vitamin (MULTIVITAMIN) tablet Take 1 tablet by mouth daily.    Historical Provider, MD  polyethylene glycol powder (MIRALAX) powder Take 8.5 g by mouth daily as needed.    Historical Provider, MD  Red Yeast Rice 600 MG CAPS Take 1 capsule (600 mg total) by mouth daily. 01/02/16   Ria Bush, MD  triamcinolone cream (KENALOG) 0.1 % Apply  1 application topically 2 (two) times daily. Apply to AA. Patient not taking: Reported on 05/29/2016 08/25/15   Ria Bush, MD    Allergies as of 05/15/2016 - Review Complete 05/08/2016  Allergen Reaction Noted  . Neosporin [neomycin-bacitracin zn-polymyx] Rash 05/16/2011    Family History  Problem Relation Age of Onset  . Heart disease Mother   . Deep vein thrombosis Mother   . Dementia Mother 89  . Heart disease Father     pig valve  . Coronary artery disease Father   . Cancer Father     prostate  . Cancer Maternal Grandmother     colon  . Stroke Maternal Grandfather   . Diabetes Sister   . Mental illness Sister     schizophrenia  . Breast cancer Sister 27  . Cancer  Sister     breast  . Cancer Brother     appendix tumor, prostate  . Diabetes Brother   . Mental illness Brother     bipolar  . Cancer Maternal Aunt     bone  . Cancer Maternal Uncle     bladder  . Coronary artery disease Paternal Grandfather   . Cancer Cousin     ovarian, bone    Social History   Social History  . Marital status: Married    Spouse name: N/A  . Number of children: 1  . Years of education: N/A   Occupational History  . Postal clerk [employer]   Social History Main Topics  . Smoking status: Never Smoker  . Smokeless tobacco: Never Used  . Alcohol use 0.6 oz/week    1 Glasses of wine per week     Comment: occasional 2 xyearly  . Drug use: No  . Sexual activity: Not on file   Other Topics Concern  . Not on file   Social History Narrative   Caffeine: rare   Lives alone, no pets. Grown son 40yo, grandson died suddenly at 1yo 2010 (drowning) - lots of stress from this.   Occupation: Tour manager, works third shift   Activity: no regular exercise but bought treadmill   Diet: good water, fruits daily, vegetables occasionally    Review of Systems: See HPI, otherwise negative ROS  Physical Exam: BP 132/84   Pulse 64   Temp (!) 96.1 F (35.6 C) (Oral)   Resp 20   Ht 5' 6.5" (1.689 m)   Wt 164 lb (74.4 kg)   SpO2 96%   BMI 26.07 kg/m  General:   Alert,  pleasant and cooperative in NAD Head:  Normocephalic and atraumatic. Neck:  Supple; no masses or thyromegaly. Lungs:  Clear throughout to auscultation.    Heart:  Regular rate and rhythm. Abdomen:  Soft, nontender and nondistended. Normal bowel sounds, without guarding, and without rebound.   Neurologic:  Alert and  oriented x4;  grossly normal neurologically.  Impression/Plan: Kendra Jackson is here for an colonoscopy to be performed for surveillance due to prior history of colon polyps  Risks, benefits, limitations, and alternatives regarding  colonoscopy have been reviewed with the  patient.  Questions have been answered.  All parties agreeable.   Jonathon Bellows, MD  05/29/2016, 9:25 AM

## 2016-05-29 NOTE — Transfer of Care (Signed)
Immediate Anesthesia Transfer of Care Note  Patient: Kendra Jackson  Procedure(s) Performed: Procedure(s): COLONOSCOPY WITH PROPOFOL (N/A)  Patient Location: PACU  Anesthesia Type:General  Level of Consciousness: awake and sedated  Airway & Oxygen Therapy: Patient Spontanous Breathing and Patient connected to nasal cannula oxygen  Post-op Assessment: Report given to RN and Post -op Vital signs reviewed and stable  Post vital signs: Reviewed and stable  Last Vitals:  Vitals:   05/29/16 0914 05/29/16 1000  BP: 132/84 (!) 103/46  Pulse: 64 75  Resp: 20 17  Temp: (!) 35.6 C 36.2 C    Last Pain:  Vitals:   05/29/16 1000  TempSrc: Tympanic         Complications: No apparent anesthesia complications

## 2016-05-29 NOTE — Anesthesia Procedure Notes (Signed)
Date/Time: 05/29/2016 9:42 AM Performed by: Nelda Marseille Pre-anesthesia Checklist: Patient identified, Emergency Drugs available, Suction available, Patient being monitored and Timeout performed Oxygen Delivery Method: Nasal cannula

## 2016-05-29 NOTE — Anesthesia Postprocedure Evaluation (Signed)
Anesthesia Post Note  Patient: Kendra Jackson  Procedure(s) Performed: Procedure(s) (LRB): COLONOSCOPY WITH PROPOFOL (N/A)  Patient location during evaluation: Endoscopy Anesthesia Type: General Level of consciousness: awake and alert and oriented Pain management: pain level controlled Vital Signs Assessment: post-procedure vital signs reviewed and stable Respiratory status: spontaneous breathing, nonlabored ventilation and respiratory function stable Cardiovascular status: blood pressure returned to baseline and stable Postop Assessment: no signs of nausea or vomiting Anesthetic complications: no     Last Vitals:  Vitals:   05/29/16 1020 05/29/16 1030  BP: 139/83 103/80  Pulse: 74 72  Resp: 13 16  Temp:      Last Pain:  Vitals:   05/29/16 1000  TempSrc: Tympanic                 Lorely Bubb

## 2016-05-30 ENCOUNTER — Encounter: Payer: Self-pay | Admitting: Gastroenterology

## 2016-05-30 LAB — SURGICAL PATHOLOGY

## 2016-06-01 ENCOUNTER — Encounter: Payer: Self-pay | Admitting: Family Medicine

## 2016-06-01 ENCOUNTER — Encounter: Payer: Self-pay | Admitting: Gastroenterology

## 2016-06-14 ENCOUNTER — Other Ambulatory Visit: Payer: Self-pay

## 2016-08-14 IMAGING — DX DG CHEST 2V
2 series · 2 of 2 positions shown · non-contrast
Comparison: None.

CLINICAL DATA: Cough for 2 weeks

EXAM:
CHEST  2 VIEW

[chest pa]
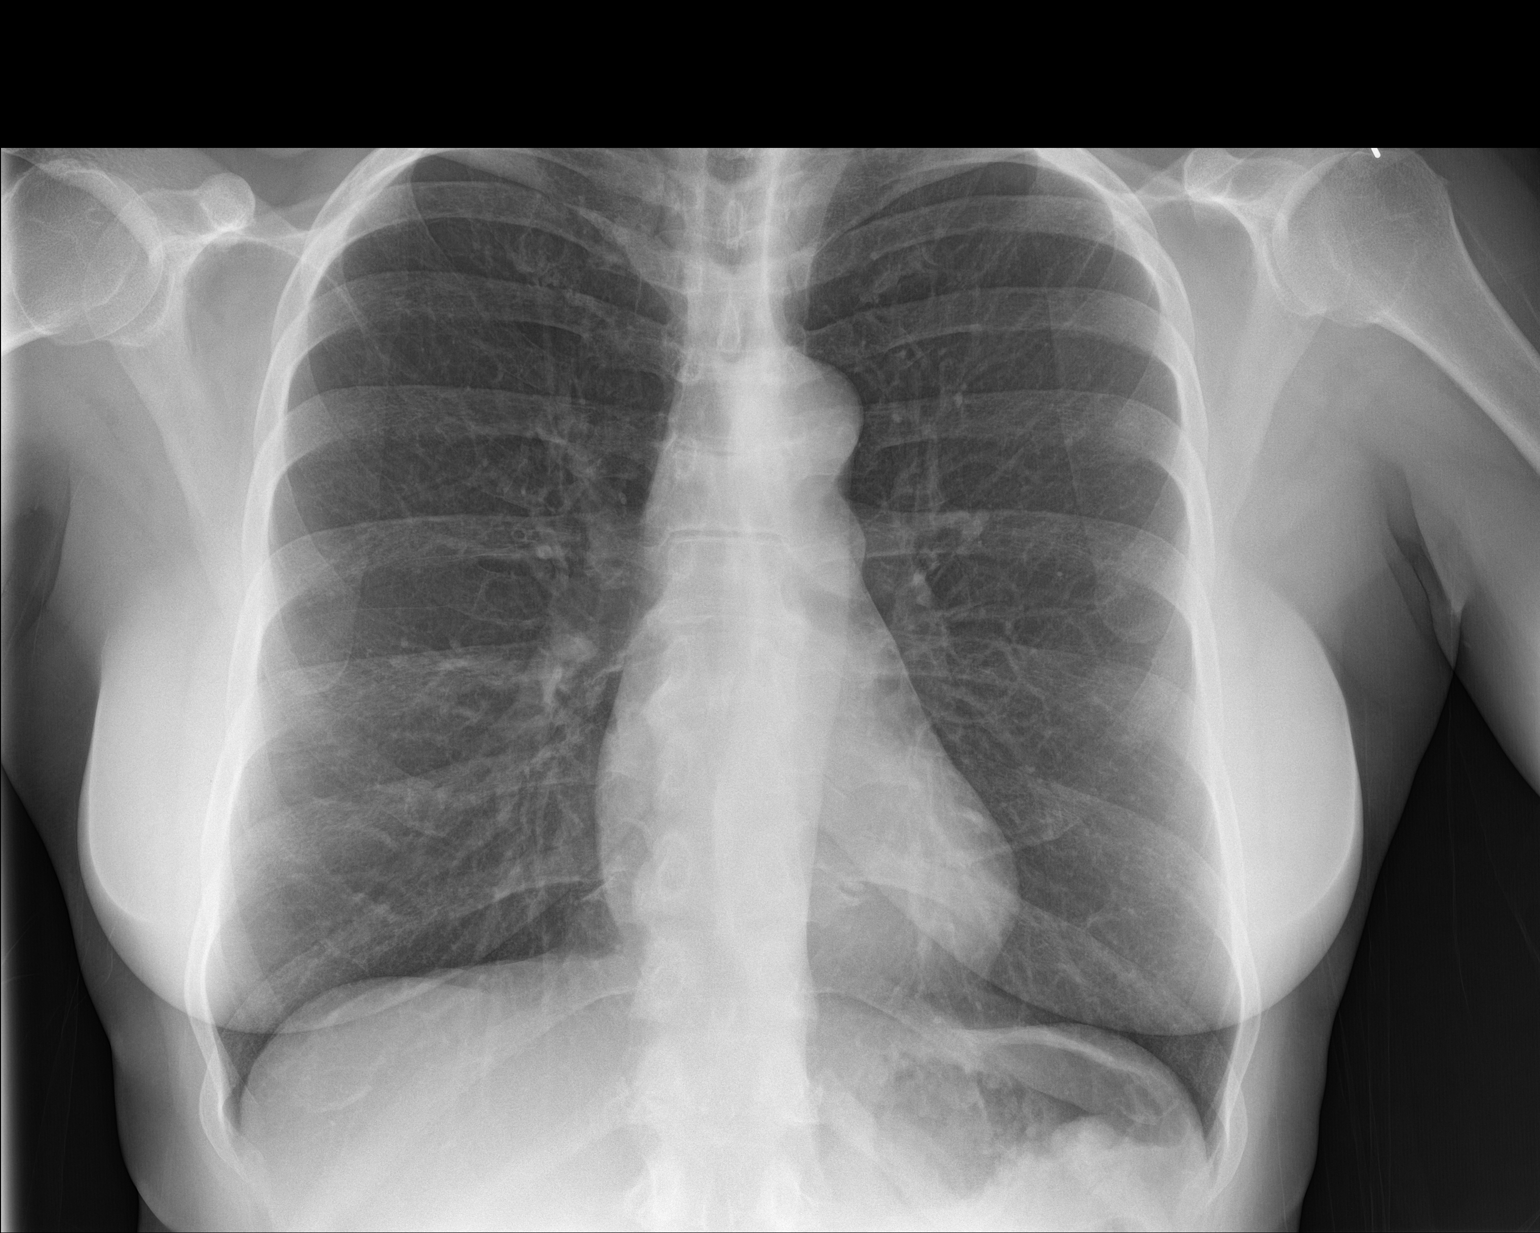

[chest lat]
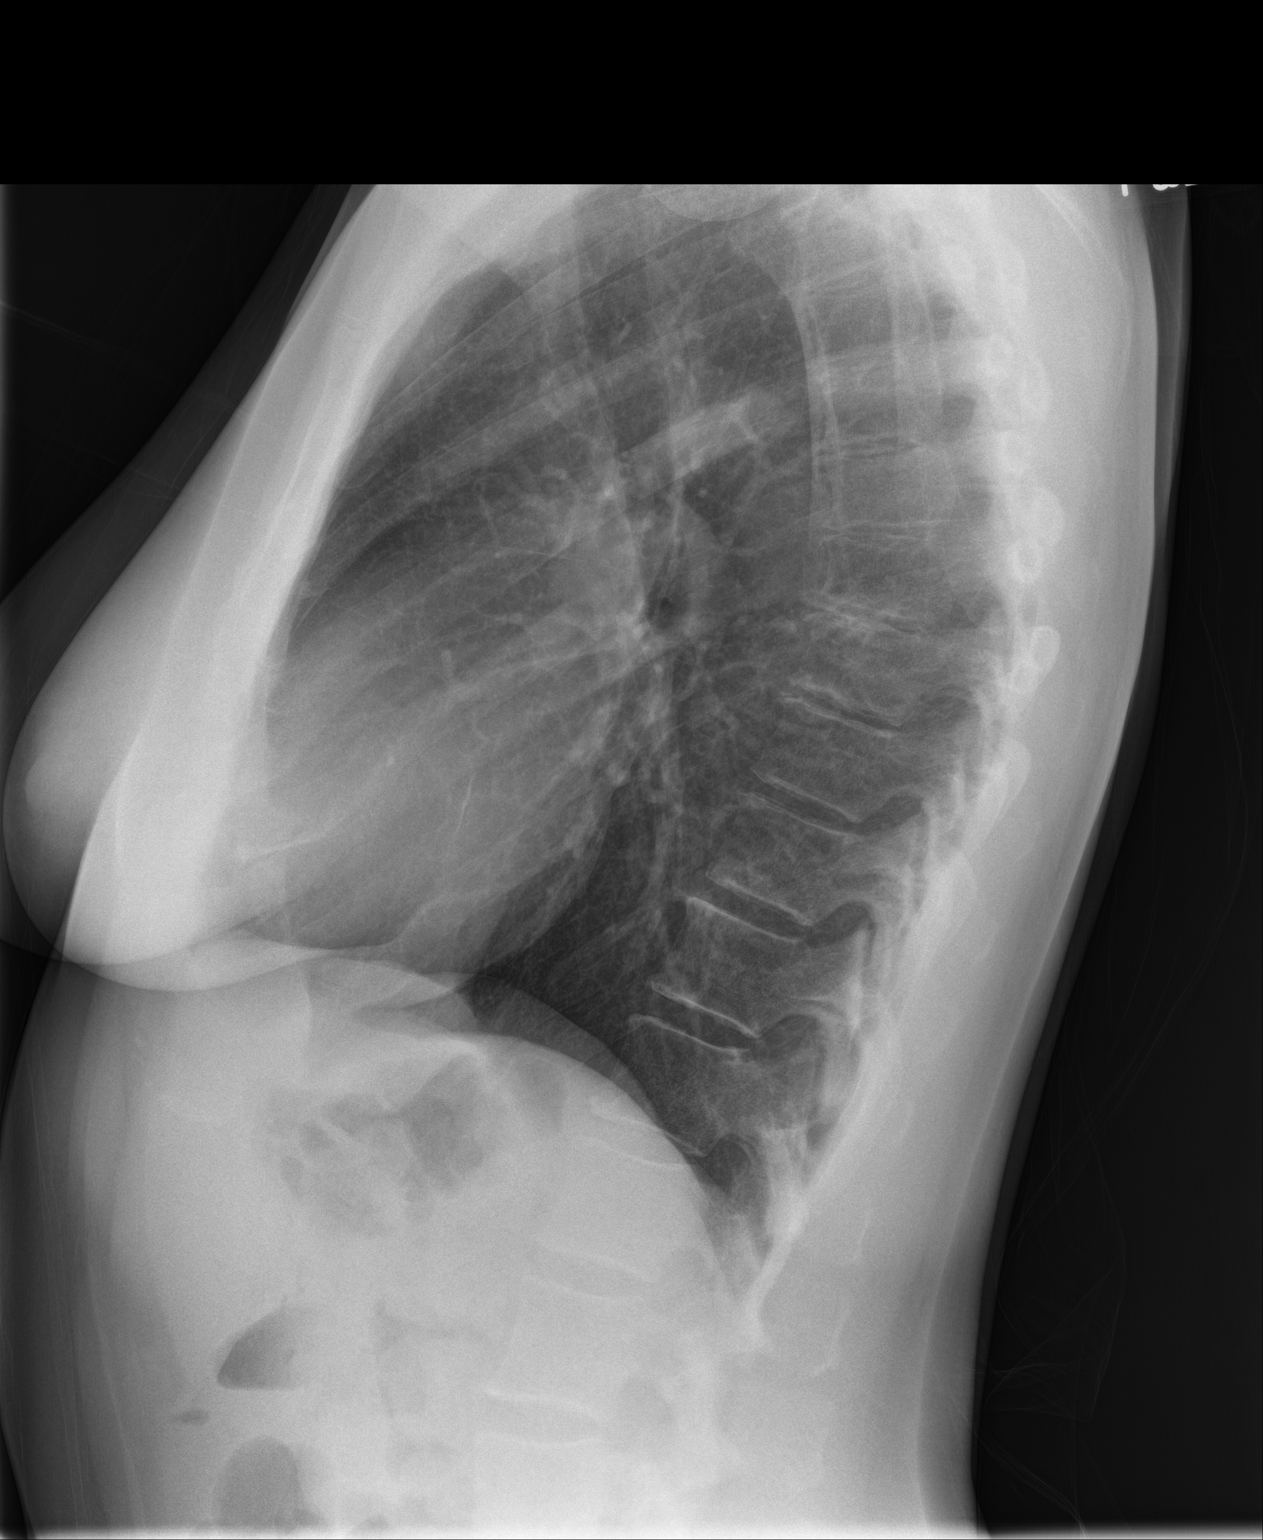

[2 of 2 positions shown; findings below may reference images not displayed]

FINDINGS: Cardiomediastinal silhouette is unremarkable. No acute infiltrate or
pleural effusion. No pulmonary edema. Mild degenerative changes mid
thoracic spine.
IMPRESSION: No active cardiopulmonary disease. Mild degenerative changes mid
thoracic spine.

## 2016-09-06 ENCOUNTER — Ambulatory Visit: Payer: Federal, State, Local not specified - PPO | Admitting: Family Medicine

## 2016-10-29 ENCOUNTER — Other Ambulatory Visit: Payer: Self-pay | Admitting: Family Medicine

## 2016-10-29 NOTE — Telephone Encounter (Signed)
Last rx and OV 04/2016

## 2016-12-28 ENCOUNTER — Encounter: Payer: Self-pay | Admitting: Internal Medicine

## 2016-12-28 ENCOUNTER — Ambulatory Visit (INDEPENDENT_AMBULATORY_CARE_PROVIDER_SITE_OTHER): Payer: Federal, State, Local not specified - PPO | Admitting: Internal Medicine

## 2016-12-28 VITALS — BP 130/82 | HR 70 | Wt 169.0 lb

## 2016-12-28 DIAGNOSIS — E042 Nontoxic multinodular goiter: Secondary | ICD-10-CM | POA: Diagnosis not present

## 2016-12-28 DIAGNOSIS — E0590 Thyrotoxicosis, unspecified without thyrotoxic crisis or storm: Secondary | ICD-10-CM | POA: Diagnosis not present

## 2016-12-28 NOTE — Patient Instructions (Signed)
Please have the thyroid tests checked on Tuesday.  Please come back for a follow-up appointment in 2 years.

## 2016-12-28 NOTE — Progress Notes (Signed)
Patient ID: Kendra Jackson, female   DOB: November 12, 1960, 56 y.o.   MRN: [id number]   HPI  Kendra Jackson is a very pleasant 56 y.o.-year-old female, returning for f/u for subclinical hyperthyroidism and MNG. Last visit 1 year ago.  She gained a little weight since last visit. She keeps active - dancing, walking, taking care of grandson.  She has discomfort in L breast for years. No pain, no skin changes. Will see PCP for this. Mammogram 01/2016 normal.  Reviewed and addended hx: Pt was told she had overactive thyroid >10 years ago.  She also has small thyroid nodules, the dominant R nodule was Bx'ed  In 2013 >> benign..   We repeated the thyroid ultrasound  On 01/06/2015: Right thyroid lobe: 4.9 x 1.5 x 1.5 cm. 1.0 x 0.8 x 1.0 cm solid nodule with calcifications inferior right thyroid lobe, previously 0.7 x 0.9 x 1.0 cm. Multiples small sub cm noncalcified nodules are noted scattered throughout the remainder of the right gland.  Left thyroid lobe: 5.2 x 1.0 x 1.4 cm. Linear echodensity, possibly calcific calcification left mid gland, unchanged. 0.6 cm solid nodule lower portion left gland.  Isthmus Thickness: 0.3 cm. No nodules visualized.  Lymphadenopathy: None visualized.    There was a slight increase in size of the R thyroid lobe nodule, however, not significant.  However, I suggested biopsy because of the presence of micro-calcifications.  Thyroid FNA (09/20/2015): Benign  I reviewed pt's thyroid tests - normal: Lab Results  Component Value Date   TSH 0.80 12/29/2015   TSH 0.62 12/24/2014   TSH 0.25 (L) 12/18/2013   TSH 0.19 (L) 07/23/2013   TSH 0.13 (L) 06/11/2013   TSH 0.28 (L) 12/10/2012   TSH 0.48 11/29/2011   TSH 0.91 12/06/2010   FREET4 0.71 12/29/2015   FREET4 0.70 12/24/2014   FREET4 0.80 12/18/2013   FREET4 0.77 07/23/2013   FREET4 0.67 06/11/2013   FREET4 0.81 12/10/2012    Pt denies: - feeling nodules in neck - hoarseness - dysphagia - choking - SOB  with lying down  She is on Atenolol 50 mg 2x daily.  She stopped Biotin 1 year ago. On L-Lysine.  Her son has hyperthyroidism.  She retired in 09/2015   She takes Apple cider vinegar and Garcinia [place].  ROS: Constitutional: + weight gain/no weight loss, no fatigue, no subjective hyperthermia, no subjective hypothermia Eyes: no blurry vision, no xerophthalmia ENT: no sore throat, + see HPI Cardiovascular: no CP/no SOB/no palpitations/no leg swelling Respiratory: no cough/no SOB/no wheezing Gastrointestinal: no N/no V/no D/+ chronic C/no acid reflux Musculoskeletal: no muscle aches/no joint aches Skin: no rashes, no hair loss Neurological: no tremors/no numbness/no tingling/no dizziness  I reviewed pt's medications, allergies, PMH, social hx, family hx, and changes were documented in the history of present illness. Otherwise, unchanged from my initial visit note.  PE: BP 130/82 (BP Location: Left Arm, Patient Position: Sitting)   Pulse 70   Wt 169 lb (76.7 kg)   SpO2 98%   BMI 26.87 kg/m  Body mass index is 26.87 kg/m. Wt Readings from Last 3 Encounters:  12/28/16 169 lb (76.7 kg)  05/29/16 164 lb (74.4 kg)  05/08/16 166 lb (75.3 kg)   Constitutional: normal weight, in NAD Eyes: PERRLA, EOMI, no exophthalmos ENT: moist mucous membranes, no thyromegaly - but lumpy-bumpy thyroid on palpation, no cervical lymphadenopathy Cardiovascular: RRR, No MRG Respiratory: CTA B Gastrointestinal: abdomen soft, NT, ND, BS+ Musculoskeletal: no deformities, strength intact in all 4 Skin:  moist, warm, no rashes Neurological: no tremor with outstretched hands, DTR normal in all 4  ASSESSMENT: 1. Subclinical hyperthyroidism - TSI 47 (<140%)  2.MNG - 07/10/2011 Thyroid nodule biopsy: benign follicular nodule  - 56/25/6389 Thyroid US: stable - 12/31/2012 Thyroid U/S:  Right thyroid lobe: 53 x 12 x 17 mm. Homogeneous background parenchyma. 7 x 9 x 10 mm solid with calcifications,  inferior right (previously 7 x 8 x 10)   Left thyroid lobe: 53 x 11 x 16 mm. Homogeneous background echotexture. At least 6 additional small bilateral nodules, 4 mm or less diameter.   Isthmus: 2.9 mm. No nodules visualized.   Lymphadenopathy None visualized. - 01/06/2015 Thyroid U/S:  Right thyroid lobe: 4.9 x 1.5 x 1.5 cm. 1.0 x 0.8 x 1.0 cm solid nodule with calcifications inferior right thyroid lobe, previously 0.7 x 0.9 x 1.0 cm. Multiples small sub cm noncalcified nodules are noted scattered throughout the remainder of the right gland.  Left thyroid lobe: 5.2 x 1.0 x 1.4 cm. Linear echodensity, possibly calcific calcification left mid gland, unchanged. 0.6 cm solid nodule lower portion left gland.  Isthmus Thickness: 0.3 cm. No nodules visualized.  Lymphadenopathy: None visualized -  09/20/2015 Thyroid FNA: benign results  PLAN:  1. Patient with a h/o subclinical hyperthyroidism, without thyrotoxic symptoms. Most recent TFTs have been normal 56 year ago. 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 MNG Latest thyroid ultrasound report was reviewed and this showed slight increase in size of her thyroid nodule, which contain microcalcification. An FNA of this nodule obtained on 09/20/2015 was benign. We discussed that no further investigation is needed for now, Unless she develops neck compression symptoms.  Philemon Kingdom, MD PhD [facility] Endocrinology

## 2016-12-31 ENCOUNTER — Other Ambulatory Visit: Payer: Self-pay | Admitting: Family Medicine

## 2016-12-31 DIAGNOSIS — E785 Hyperlipidemia, unspecified: Secondary | ICD-10-CM

## 2016-12-31 DIAGNOSIS — E0590 Thyrotoxicosis, unspecified without thyrotoxic crisis or storm: Secondary | ICD-10-CM

## 2017-01-01 ENCOUNTER — Other Ambulatory Visit: Payer: Federal, State, Local not specified - PPO

## 2017-01-01 ENCOUNTER — Other Ambulatory Visit (INDEPENDENT_AMBULATORY_CARE_PROVIDER_SITE_OTHER): Payer: Federal, State, Local not specified - PPO

## 2017-01-01 DIAGNOSIS — E785 Hyperlipidemia, unspecified: Secondary | ICD-10-CM

## 2017-01-01 DIAGNOSIS — E0590 Thyrotoxicosis, unspecified without thyrotoxic crisis or storm: Secondary | ICD-10-CM

## 2017-01-01 LAB — BASIC METABOLIC PANEL
BUN: 13 mg/dL (ref 6–23)
CHLORIDE: 103 meq/L (ref 96–112)
CO2: 30 mEq/L (ref 19–32)
Calcium: 10.1 mg/dL (ref 8.4–10.5)
Creatinine, Ser: 0.84 mg/dL (ref 0.40–1.20)
GFR: 89.51 mL/min (ref >60.00)
GLUCOSE: 104 mg/dL — AB (ref 70–99)
POTASSIUM: 3.9 meq/L (ref 3.5–5.1)
SODIUM: 140 meq/L (ref 135–145)

## 2017-01-01 LAB — T3, FREE: T3 FREE: 3.4 pg/mL (ref 2.3–4.2)

## 2017-01-01 LAB — LIPID PANEL
CHOLESTEROL: 280 mg/dL — AB (ref 0–200)
HDL: 84.5 mg/dL (ref >39.00)
LDL Cholesterol: 171 mg/dL — ABNORMAL HIGH (ref 0–99)
NONHDL: 195.71
Total CHOL/HDL Ratio: 3
Triglycerides: 123 mg/dL (ref 0.0–149.0)
VLDL: 24.6 mg/dL (ref 0.0–40.0)

## 2017-01-01 LAB — T4, FREE: Free T4: 0.77 ng/dL (ref 0.60–1.60)

## 2017-01-01 LAB — TSH: TSH: 0.48 u[IU]/mL (ref 0.35–4.50)

## 2017-01-03 ENCOUNTER — Ambulatory Visit (INDEPENDENT_AMBULATORY_CARE_PROVIDER_SITE_OTHER): Payer: Federal, State, Local not specified - PPO | Admitting: Family Medicine

## 2017-01-03 ENCOUNTER — Encounter: Payer: Self-pay | Admitting: Family Medicine

## 2017-01-03 VITALS — BP 122/70 | HR 70 | Temp 98.3°F | Ht 66.5 in | Wt 167.5 lb

## 2017-01-03 DIAGNOSIS — E785 Hyperlipidemia, unspecified: Secondary | ICD-10-CM

## 2017-01-03 DIAGNOSIS — E0590 Thyrotoxicosis, unspecified without thyrotoxic crisis or storm: Secondary | ICD-10-CM

## 2017-01-03 DIAGNOSIS — N644 Mastodynia: Secondary | ICD-10-CM | POA: Diagnosis not present

## 2017-01-03 DIAGNOSIS — Z8619 Personal history of other infectious and parasitic diseases: Secondary | ICD-10-CM

## 2017-01-03 DIAGNOSIS — E042 Nontoxic multinodular goiter: Secondary | ICD-10-CM | POA: Diagnosis not present

## 2017-01-03 DIAGNOSIS — I10 Essential (primary) hypertension: Secondary | ICD-10-CM

## 2017-01-03 DIAGNOSIS — Z0000 Encounter for general adult medical examination without abnormal findings: Secondary | ICD-10-CM

## 2017-01-03 DIAGNOSIS — Z23 Encounter for immunization: Secondary | ICD-10-CM

## 2017-01-03 LAB — POC URINALSYSI DIPSTICK (AUTOMATED)
Bilirubin, UA: NEGATIVE
GLUCOSE UA: NEGATIVE
KETONES UA: NEGATIVE
Nitrite, UA: NEGATIVE
PH UA: 5.5 (ref 5.0–8.0)
Protein, UA: NEGATIVE
RBC UA: NEGATIVE
SPEC GRAV UA: 1.02 (ref 1.010–1.025)
Urobilinogen, UA: 0.2 E.U./dL

## 2017-01-03 MED ORDER — VALACYCLOVIR HCL 500 MG PO TABS: 500.0000 mg | ORAL_TABLET | Freq: Two times a day (BID) | ORAL | 1 refills | Status: DC

## 2017-01-03 MED ORDER — ATENOLOL 50 MG PO TABS: 50.0000 mg | ORAL_TABLET | Freq: Two times a day (BID) | ORAL | 3 refills | Status: DC

## 2017-01-03 NOTE — Assessment & Plan Note (Signed)
Appreciate endo care. Seeing Q2 yrs.

## 2017-01-03 NOTE — Assessment & Plan Note (Addendum)
Longstanding concern.  She never completed dx mammo L breast and [place] 2015.  Will order again this year - she agrees to complete this year.

## 2017-01-03 NOTE — Addendum Note (Signed)
Addended by: Brenton Grills on: 59/74/1638 12:47 PM   Modules accepted: Orders

## 2017-01-03 NOTE — Assessment & Plan Note (Addendum)
Chronic. Last year we started RYR onc daily. LDL worse. Still low risk. Continue once daily. Encouraged low cholesterol diet. The 10-year ASCVD risk score [name] [name]., et al., 2013) is: 5.2%   Values used to calculate the score:     Age: 55 years     Sex: Female     Is Non-Hispanic African American: Yes     Diabetic: No     Tobacco smoker: No     Systolic Blood Pressure: 643 mmHg     Is BP treated: Yes     HDL Cholesterol: 84.5 mg/dL     Total Cholesterol: 280 mg/dL

## 2017-01-03 NOTE — Patient Instructions (Addendum)
Flu shot today Cholesterol was too high - work on low cholesterol diet. Continue medicines.  Return as needed or in 1 year for next physical.  Health Maintenance, Female Adopting a healthy lifestyle and getting preventive care can go a long way to promote health and wellness. Talk with your health care provider about what schedule of regular examinations is right for you. This is a good chance for you to check in with your provider about disease prevention and staying healthy. In between checkups, there are plenty of things you can do on your own. Experts have done a lot of research about which lifestyle changes and preventive measures are most likely to keep you healthy. Ask your health care provider for more information. Weight and diet Eat a healthy diet  Be sure to include plenty of vegetables, fruits, low-fat dairy products, and lean protein.  Do not eat a lot of foods high in solid fats, added sugars, or salt.  Get regular exercise. This is one of the most important things you can do for your health. ? Most adults should exercise for at least 150 minutes each week. The exercise should increase your heart rate and make you sweat (moderate-intensity exercise). ? Most adults should also do strengthening exercises at least twice a week. This is in addition to the moderate-intensity exercise.  Maintain a healthy weight  Body mass index (BMI) is a measurement that can be used to identify possible weight problems. It estimates body fat based on height and weight. Your health care provider can help determine your BMI and help you achieve or maintain a healthy weight.  For females 55 years of age and older: ? A BMI below 18.5 is considered underweight. ? A BMI of 18.5 to 24.9 is normal. ? A BMI of 25 to 29.9 is considered overweight. ? A BMI of 30 and above is considered obese.  Watch levels of cholesterol and blood lipids  You should start having your blood tested for lipids and  cholesterol at 58 years of age, then have this test every 5 years.  You may need to have your cholesterol levels checked more often if: ? Your lipid or cholesterol levels are high. ? You are older than 58 years of age. ? You are at high risk for heart disease.  Cancer screening Lung Cancer  Lung cancer screening is recommended for adults 74-16 years old who are at high risk for lung cancer because of a history of smoking.  A yearly low-dose CT scan of the lungs is recommended for people who: ? Currently smoke. ? Have quit within the past 15 years. ? Have at least a 30-pack-year history of smoking. A pack year is smoking an average of one pack of cigarettes a day for 1 year.  Yearly screening should continue until it has been 15 years since you quit.  Yearly screening should stop if you develop a health problem that would prevent you from having lung cancer treatment.  Breast Cancer  Practice breast self-awareness. This means understanding how your breasts normally appear and feel.  It also means doing regular breast self-exams. Let your health care provider know about any changes, no matter how small.  If you are in your 20s or 30s, you should have a clinical breast exam (CBE) by a health care provider every 1-3 years as part of a regular health exam.  If you are 83 or older, have a CBE every year. Also consider having a breast X-ray (mammogram) every  year.  If you have a family history of breast cancer, talk to your health care provider about genetic screening.  If you are at high risk for breast cancer, talk to your health care provider about having an MRI and a mammogram every year.  Breast cancer gene (BRCA) assessment is recommended for women who have family members with BRCA-related cancers. BRCA-related cancers include: ? Breast. ? Ovarian. ? Tubal. ? Peritoneal cancers.  Results of the assessment will determine the need for genetic counseling and BRCA1 and BRCA2  testing.  Cervical Cancer Your health care provider may recommend that you be screened regularly for cancer of the pelvic organs (ovaries, uterus, and vagina). This screening involves a pelvic examination, including checking for microscopic changes to the surface of your cervix (Pap test). You may be encouraged to have this screening done every 3 years, beginning at age 54.  For women ages 31-65, health care providers may recommend pelvic exams and Pap testing every 3 years, or they may recommend the Pap and pelvic exam, combined with testing for human papilloma virus (HPV), every 5 years. Some types of HPV increase your risk of cervical cancer. Testing for HPV may also be done on women of any age with unclear Pap test results.  Other health care providers may not recommend any screening for nonpregnant women who are considered low risk for pelvic cancer and who do not have symptoms. Ask your health care provider if a screening pelvic exam is right for you.  If you have had past treatment for cervical cancer or a condition that could lead to cancer, you need Pap tests and screening for cancer for at least 20 years after your treatment. If Pap tests have been discontinued, your risk factors (such as having a new sexual partner) need to be reassessed to determine if screening should resume. Some women have medical problems that increase the chance of getting cervical cancer. In these cases, your health care provider may recommend more frequent screening and Pap tests.  Colorectal Cancer  This type of cancer can be detected and often prevented.  Routine colorectal cancer screening usually begins at 58 years of age and continues through 58 years of age.  Your health care provider may recommend screening at an earlier age if you have risk factors for colon cancer.  Your health care provider may also recommend using home test kits to check for hidden blood in the stool.  A small camera at the end of a  tube can be used to examine your colon directly (sigmoidoscopy or colonoscopy). This is done to check for the earliest forms of colorectal cancer.  Routine screening usually begins at age 36.  Direct examination of the colon should be repeated every 5-10 years through 58 years of age. However, you may need to be screened more often if early forms of precancerous polyps or small growths are found.  Skin Cancer  Check your skin from head to toe regularly.  Tell your health care provider about any new moles or changes in moles, especially if there is a change in a mole's shape or color.  Also tell your health care provider if you have a mole that is larger than the size of a pencil eraser.  Always use sunscreen. Apply sunscreen liberally and repeatedly throughout the day.  Protect yourself by wearing long sleeves, pants, a wide-brimmed hat, and sunglasses whenever you are outside.  Heart disease, diabetes, and high blood pressure  High blood pressure causes heart  disease and increases the risk of stroke. High blood pressure is more likely to develop in: ? People who have blood pressure in the high end of the normal range (130-139/85-89 mm Hg). ? People who are overweight or obese. ? People who are African American.  If you are 84-59 years of age, have your blood pressure checked every 3-5 years. If you are 61 years of age or older, have your blood pressure checked every year. You should have your blood pressure measured twice-once when you are at a hospital or clinic, and once when you are not at a hospital or clinic. Record the average of the two measurements. To check your blood pressure when you are not at a hospital or clinic, you can use: ? An automated blood pressure machine at a pharmacy. ? A home blood pressure monitor.  If you are between 66 years and 20 years old, ask your health care provider if you should take aspirin to prevent strokes.  Have regular diabetes screenings. This  involves taking a blood sample to check your fasting blood sugar level. ? If you are at a normal weight and have a low risk for diabetes, have this test once every three years after 58 years of age. ? If you are overweight and have a high risk for diabetes, consider being tested at a younger age or more often. Preventing infection Hepatitis B  If you have a higher risk for hepatitis B, you should be screened for this virus. You are considered at high risk for hepatitis B if: ? You were born in a country where hepatitis B is common. Ask your health care provider which countries are considered high risk. ? Your parents were born in a high-risk country, and you have not been immunized against hepatitis B (hepatitis B vaccine). ? You have HIV or AIDS. ? You use needles to inject street drugs. ? You live with someone who has hepatitis B. ? You have had sex with someone who has hepatitis B. ? You get hemodialysis treatment. ? You take certain medicines for conditions, including cancer, organ transplantation, and autoimmune conditions.  Hepatitis C  Blood testing is recommended for: ? Everyone born from [birth year] through 1967. ? Anyone with known risk factors for hepatitis C.  Sexually transmitted infections (STIs)  You should be screened for sexually transmitted infections (STIs) including gonorrhea and chlamydia if: ? You are sexually active and are younger than 58 years of age. ? You are older than 58 years of age and your health care provider tells you that you are at risk for this type of infection. ? Your sexual activity has changed since you were last screened and you are at an increased risk for chlamydia or gonorrhea. Ask your health care provider if you are at risk.  If you do not have HIV, but are at risk, it may be recommended that you take a prescription medicine daily to prevent HIV infection. This is called pre-exposure prophylaxis (PrEP). You are considered at risk if: ? You are  sexually active and do not regularly use condoms or know the HIV status of your partner(s). ? You take drugs by injection. ? You are sexually active with a partner who has HIV.  Talk with your health care provider about whether you are at high risk of being infected with HIV. If you choose to begin PrEP, you should first be tested for HIV. You should then be tested every 3 months for as long as you  are taking PrEP. Pregnancy  If you are premenopausal and you may become pregnant, ask your health care provider about preconception counseling.  If you may become pregnant, take 400 to 800 micrograms (mcg) of folic acid every day.  If you want to prevent pregnancy, talk to your health care provider about birth control (contraception). Osteoporosis and menopause  Osteoporosis is a disease in which the bones lose minerals and strength with aging. This can result in serious bone fractures. Your risk for osteoporosis can be identified using a bone density scan.  If you are 34 years of age or older, or if you are at risk for osteoporosis and fractures, ask your health care provider if you should be screened.  Ask your health care provider whether you should take a calcium or vitamin D supplement to lower your risk for osteoporosis.  Menopause may have certain physical symptoms and risks.  Hormone replacement therapy may reduce some of these symptoms and risks. Talk to your health care provider about whether hormone replacement therapy is right for you. Follow these instructions at home:  Schedule regular health, dental, and eye exams.  Stay current with your immunizations.  Do not use any tobacco products including cigarettes, chewing tobacco, or electronic cigarettes.  If you are pregnant, do not drink alcohol.  If you are breastfeeding, limit how much and how often you drink alcohol.  Limit alcohol intake to no more than 1 drink per day for nonpregnant women. One drink equals 12 ounces of  beer, 5 ounces of wine, or 1 ounces of hard liquor.  Do not use street drugs.  Do not share needles.  Ask your health care provider for help if you need support or information about quitting drugs.  Tell your health care provider if you often feel depressed.  Tell your health care provider if you have ever been abused or do not feel safe at home. This information is not intended to replace advice given to you by your health care provider. Make sure you discuss any questions you have with your health care provider. Document Released: 09/25/2010 Document Revised: 08/18/2015 Document Reviewed: 12/14/2014 Elsevier Interactive Patient Education  [employer].

## 2017-01-03 NOTE — Progress Notes (Addendum)
BP 122/70 (BP Location: Left Arm, Patient Position: Sitting, Cuff Size: Normal)   Pulse 70   Temp 98.3 F (36.8 C) (Oral)   Ht 5' 6.5" (1.689 m)   Wt 167 lb 8 oz (76 kg)   SpO2 94%   BMI 26.63 kg/m    CC: CPE Subjective:    Patient ID: Kendra Jackson, female    DOB: 1960/11/29, 56 y.o.   MRN: [id number]  HPI: Kendra Jackson is a 56 y.o. female presenting on 01/03/2017 for Annual Exam (Wants to discuss daily maintenance of valacyclovir) and Breast Pain (Has had some tenderness for years)   She does NOT have UTI sxs today.   H/o herpes, 1 outbreak this past year.   Currently sexually active - 1 partner in last year. Declines STD screening Subclinical hyperthyroidism with MNG - stable followed by endo Q2 yrs.   Preventative: COLONOSCOPY 05/2016 - TA, rpt 5 yrs Vicente Males)  Well woman - pap smear WNL 2016, remote h/o abnormal cells s/p cryotherapy.  LMP - 04/12/2009, no bleeding since then.  Mammogram - normal 01/2016. Dense breasts - suggested 3D mammo next month. Some left breast discomfort for year.  Flu shot yearly Tdap 2013  Seat belt use discussed Sunscreen use discussed. No changing moles on skin Non smoker Alcohol - rare  Caffeine: rare  Lives alone, no pets. Grown son 13yo, grandson died suddenly at 1yo 2010 (drowning) - lots of stress from this.  Occupation: Tour manager, works third shift - retired 2018! Activity: no regular exercise but bought treadmill  Diet: good water, fruits daily, vegetables occasionally   Relevant past medical, surgical, family and social history reviewed and updated as indicated. Interim medical history since our last visit reviewed. Allergies and medications reviewed and updated. Outpatient Medications Prior to Visit  Medication Sig Dispense Refill  . aspirin 81 MG tablet Take 81 mg by mouth daily.    . diphenhydrAMINE (BENADRYL) 25 MG tablet Take 25 mg by mouth at bedtime as needed.    . Misc Natural Products (APPLE CIDER VINEGAR) TABS  Take by mouth daily.    . Multiple Vitamin (MULTIVITAMIN) tablet Take 1 tablet by mouth daily.    . Red Yeast Rice 600 MG CAPS Take 1 capsule (600 mg total) by mouth daily.    . Ascorbic Acid (VITAMIN C) 1000 MG tablet Take 1,000 mg by mouth daily.    Marland Kitchen atenolol (TENORMIN) 50 MG tablet TAKE ONE TABLET TWICE DAILY 60 tablet 11  . chlorpheniramine-HYDROcodone (TUSSIONEX PENNKINETIC ER) 10-8 MG/5ML SUER Take 5 mLs by mouth at bedtime as needed for cough. 120 mL 0  . GARCINIA CAMBOGIA-CHROMIUM PO Take 1 capsule by mouth daily.    . polyethylene glycol powder (MIRALAX) powder Take 8.5 g by mouth daily as needed.    . triamcinolone cream (KENALOG) 0.1 % Apply 1 application topically 2 (two) times daily. Apply to AA. 30 g 0  . valACYclovir (VALTREX) 500 MG tablet TAKE 1 TABLET (500 MG TOTAL) BY MOUTH 2 (TWO) TIMES DAILY. FOR 3 DAYS FOR OUTBREAK (6 PILLS TOTAL). 24 tablet 0   No facility-administered medications prior to visit.      Per HPI unless specifically indicated in ROS section below Review of Systems  Constitutional: Negative for activity change, appetite change, chills, fatigue, fever and unexpected weight change.  HENT: Negative for hearing loss.   Eyes: Negative for visual disturbance.  Respiratory: Negative for cough, chest tightness, shortness of breath and wheezing.   Cardiovascular: Negative for  chest pain, palpitations and leg swelling.  Gastrointestinal: Positive for constipation. Negative for abdominal distention, abdominal pain, blood in stool, diarrhea, nausea and vomiting.  Genitourinary: Negative for difficulty urinating and hematuria.  Musculoskeletal: Negative for arthralgias, myalgias and neck pain.  Skin: Negative for rash.  Neurological: Negative for dizziness, seizures, syncope and headaches.  Hematological: Negative for adenopathy. Does not bruise/bleed easily.  Psychiatric/Behavioral: Negative for dysphoric mood. The patient is not nervous/anxious.          Objective:    BP 122/70 (BP Location: Left Arm, Patient Position: Sitting, Cuff Size: Normal)   Pulse 70   Temp 98.3 F (36.8 C) (Oral)   Ht 5' 6.5" (1.689 m)   Wt 167 lb 8 oz (76 kg)   SpO2 94%   BMI 26.63 kg/m   Wt Readings from Last 3 Encounters:  01/03/17 167 lb 8 oz (76 kg)  12/28/16 169 lb (76.7 kg)  05/29/16 164 lb (74.4 kg)    Physical Exam  Constitutional: She is oriented to person, place, and time. She appears well-developed and well-nourished. No distress.  HENT:  Head: Normocephalic and atraumatic.  Right Ear: Hearing, tympanic membrane, external ear and ear canal normal.  Left Ear: Hearing, tympanic membrane, external ear and ear canal normal.  Nose: Nose normal.  Mouth/Throat: Uvula is midline, oropharynx is clear and moist and mucous membranes are normal. No oropharyngeal exudate, posterior oropharyngeal edema or posterior oropharyngeal erythema.  Eyes: Pupils are equal, round, and reactive to light. Conjunctivae and EOM are normal. No scleral icterus.  Neck: Normal range of motion. Neck supple. Thyromegaly (goiter) present.  Cardiovascular: Normal rate, regular rhythm, normal heart sounds and intact distal pulses.   No murmur heard. Pulses:      Radial pulses are 2+ on the right side, and 2+ on the left side.  Pulmonary/Chest: Effort normal and breath sounds normal. No respiratory distress. She has no wheezes. She has no rales. Right breast exhibits no inverted nipple, no mass, no nipple discharge, no skin change and no tenderness. Left breast exhibits tenderness (diffuse). Left breast exhibits no inverted nipple, no mass, no nipple discharge and no skin change. Breasts are symmetrical.  Abdominal: Soft. Bowel sounds are normal. She exhibits no distension and no mass. There is no tenderness. There is no rebound and no guarding.  Musculoskeletal: Normal range of motion. She exhibits no edema.  Lymphadenopathy:       Head (right side): No submental, no submandibular,  no tonsillar, no preauricular and no posterior auricular adenopathy present.       Head (left side): No submental, no submandibular, no tonsillar, no preauricular and no posterior auricular adenopathy present.    She has no cervical adenopathy.    She has no axillary adenopathy.       Right axillary: No lateral adenopathy present.       Left axillary: No lateral adenopathy present.      Right: No supraclavicular adenopathy present.       Left: No supraclavicular adenopathy present.  Neurological: She is alert and oriented to person, place, and time.  CN grossly intact, station and gait intact  Skin: Skin is warm and dry. No rash noted.  Psychiatric: She has a normal mood and affect. Her behavior is normal. Judgment and thought content normal.  Nursing note and vitals reviewed.  Results for orders placed or performed in visit on 01/01/17  Lipid panel  Result Value Ref Range   Cholesterol 280 (H) 0 - 200 mg/dL  Triglycerides 123.0 0.0 - 149.0 mg/dL   HDL 84.50 >39.00 mg/dL   VLDL 24.6 0.0 - 40.0 mg/dL   LDL Cholesterol 171 (H) 0 - 99 mg/dL   Total CHOL/HDL Ratio 3    NonHDL 939.03   Basic metabolic panel  Result Value Ref Range   Sodium 140 135 - 145 mEq/L   Potassium 3.9 3.5 - 5.1 mEq/L   Chloride 103 96 - 112 mEq/L   CO2 30 19 - 32 mEq/L   Glucose, Bld 104 (H) 70 - 99 mg/dL   BUN 13 6 - 23 mg/dL   Creatinine, Ser 0.84 0.40 - 1.20 mg/dL   Calcium 10.1 8.4 - 10.5 mg/dL   GFR 89.51 >60.00 mL/min  T4, free  Result Value Ref Range   Free T4 0.77 0.60 - 1.60 ng/dL  TSH  Result Value Ref Range   TSH 0.48 0.35 - 4.50 uIU/mL  T3, Free  Result Value Ref Range   T3, Free 3.4 2.3 - 4.2 pg/mL      Assessment & Plan:   Problem List Items Addressed This Visit    Breast pain, left    Longstanding concern.  She never completed dx mammo L breast and [place] 2015.  Will order again this year - she agrees to complete this year.      Relevant Orders   MM Digital Diagnostic Bilat   US  BREAST LTD UNI LEFT INC [place] maintenance - Primary    Preventative protocols reviewed and updated unless pt declined. Discussed healthy diet and lifestyle.       History of herpes genitalis    Discussed valtrex dosing.       HLD (hyperlipidemia)    Chronic. Last year we started RYR onc daily. LDL worse. Still low risk. Continue once daily. Encouraged low cholesterol diet. The 10-year ASCVD risk score [name] [name]., et al., 2013) is: 5.2%   Values used to calculate the score:     Age: 1 years     Sex: Female     Is Non-Hispanic African American: Yes     Diabetic: No     Tobacco smoker: No     Systolic Blood Pressure: 009 mmHg     Is BP treated: Yes     HDL Cholesterol: 84.5 mg/dL     Total Cholesterol: 280 mg/dL       Relevant Medications   atenolol (TENORMIN) 50 MG tablet   HTN (hypertension)    Chronic, stable. Continue atenolol.       Relevant Medications   atenolol (TENORMIN) 50 MG tablet   Multinodular goiter    Continue f/u with endo.      Relevant Medications   atenolol (TENORMIN) 50 MG tablet   Subclinical hyperthyroidism    Appreciate endo care. Seeing Q2 yrs.      Relevant Medications   atenolol (TENORMIN) 50 MG tablet       Follow up plan: Return in about 1 year (around 01/03/2018) for annual exam, prior fasting for blood work.  Ria Bush, MD

## 2017-01-03 NOTE — Assessment & Plan Note (Signed)
Continue f/u with endo

## 2017-01-03 NOTE — Assessment & Plan Note (Signed)
Chronic, stable. Continue atenolol.

## 2017-01-03 NOTE — Assessment & Plan Note (Signed)
Discussed valtrex dosing.

## 2017-01-03 NOTE — Assessment & Plan Note (Signed)
Preventative protocols reviewed and updated unless pt declined. Discussed healthy diet and lifestyle.  

## 2017-01-11 ENCOUNTER — Ambulatory Visit
Admission: RE | Admit: 2017-01-11 | Discharge: 2017-01-11 | Disposition: A | Discharge status: Home / Self Care | Payer: Federal, State, Local not specified - PPO | Source: Ambulatory Visit | Attending: Family Medicine | Admitting: Family Medicine

## 2017-01-11 DIAGNOSIS — R928 Other abnormal and inconclusive findings on diagnostic imaging of breast: Secondary | ICD-10-CM | POA: Diagnosis not present

## 2017-01-11 DIAGNOSIS — N644 Mastodynia: Secondary | ICD-10-CM | POA: Insufficient documentation

## 2017-01-11 DIAGNOSIS — N6002 Solitary cyst of left breast: Secondary | ICD-10-CM | POA: Diagnosis not present

## 2017-01-11 LAB — HM MAMMOGRAPHY

## 2017-01-14 ENCOUNTER — Encounter: Payer: Self-pay | Admitting: Family Medicine

## 2017-03-21 ENCOUNTER — Other Ambulatory Visit: Payer: Self-pay | Admitting: Family Medicine

## 2017-03-21 NOTE — Telephone Encounter (Signed)
Last filled:  04/22/15, 140 ml Last OV (CPE):  01/03/17 Next OV:  01/09/18

## 2017-03-24 NOTE — Telephone Encounter (Signed)
Sent electronically 

## 2017-07-03 ENCOUNTER — Encounter: Payer: Self-pay | Admitting: Family Medicine

## 2017-07-03 ENCOUNTER — Other Ambulatory Visit: Payer: Self-pay | Admitting: Family Medicine

## 2017-07-03 ENCOUNTER — Telehealth: Payer: Self-pay | Admitting: Family Medicine

## 2017-07-03 ENCOUNTER — Ambulatory Visit: Payer: Federal, State, Local not specified - PPO | Admitting: Family Medicine

## 2017-07-03 ENCOUNTER — Other Ambulatory Visit: Payer: Self-pay

## 2017-07-03 VITALS — BP 140/100 | HR 79 | Temp 98.6°F | Ht 66.5 in | Wt 166.8 lb

## 2017-07-03 DIAGNOSIS — J208 Acute bronchitis due to other specified organisms: Secondary | ICD-10-CM

## 2017-07-03 MED ORDER — DOXYCYCLINE HYCLATE 100 MG PO TABS: 100.0000 mg | ORAL_TABLET | Freq: Two times a day (BID) | ORAL | 0 refills | Status: AC

## 2017-07-03 MED ORDER — HYDROCODONE-CHLORPHENIRAMINE 5-4 MG/5ML PO SOLN: 5.0000 mL | Freq: Every evening | ORAL | 0 refills | Status: DC | PRN

## 2017-07-03 MED ORDER — HYDROCODONE-HOMATROPINE 5-1.5 MG/5ML PO SYRP: 5.0000 mL | ORAL_SOLUTION | Freq: Three times a day (TID) | ORAL | 0 refills | Status: DC | PRN

## 2017-07-03 NOTE — Telephone Encounter (Signed)
Spoke with [place] at [facility].  They do have Hycodan in stock.  Please send in new Rx.

## 2017-07-03 NOTE — Progress Notes (Signed)
Dr. Frederico Hamman T. Tanielle Emigh, MD, [facility] [place], [address] Phone: [phone] Fax: [phone]  07/03/2017  Patient: Kendra Jackson, MRN: [id number], DOB: 14-Feb-1961, 57 y.o.  Primary Physician:  Ria Bush, MD   Chief Complaint  Patient presents with  . Cough    keeping up at night  . Sneezing  . Excessive Sweating  . Nasal Congestion   Subjective:   Kendra Jackson is a 57 y.o. very pleasant female patient who presents with the following:  Patient presents for presents evaluation of myalgias, nasal congestion, productive cough, rhinorrhea , sneezing and sore throat. Symptoms began > 1 week ago and are gradually worsening since that time.    2 weeks has been sick, woke up sneezing , coughing, sweating, throwing up bad. Not sleeping. Taking some nyquil. Slugging no appetite.   Risk Factors: asthma  The patient denies significant nausea, vomitting, diarrhea, rash, diffuse arthralgia or myalgia. They also deny high fever.   Past Medical History, Surgical History, Social History, Family History, Problem List, Medications, and Allergies have been reviewed and updated if relevant.  Patient Active Problem List   Diagnosis Date Noted  . Vaginal discharge 03/08/2016  . Adjustment disorder with mixed anxiety and depressed mood 10/28/2015  . Cough 04/22/2015  . Insomnia 12/31/2014  . Reactive airway disease with acute exacerbation 07/08/2014  . Breast pain, left 10/02/2013  . Left hand paresthesia 06/11/2013  . Reflux esophagitis   . Subclinical hyperthyroidism 12/22/2012  . HLD (hyperlipidemia) 12/05/2012  . Healthcare maintenance 12/17/2011  . History of herpes genitalis   . Multinodular goiter 07/02/2011  . Earlobe lesion 05/16/2011  . Rectal pressure 05/16/2011  . HTN (hypertension)   . History of colon polyps   . Abnormal large bowel motility     Past Medical History:  Diagnosis Date  . Abnormal  large bowel motility    since child, uses laxatives/enemas regularly  . Abnormal pigmentation of skin 2004   L ear antihelix - bx consistent with SK Sharlett Iles), recheck stable per Dr. Kellie Moor  . Constipation    severe requiring laxatives, failed miralax  . Enlarged thyroid    h/o abnl labs but never treated  . Family history of breast cancer in first degree relative   . History of colon polyps    hyperplastic polyp 2008, 2013 hemorrhoids, rec rpt 5 yrs  . History of herpes genitalis    valtrex prn  . HLD (hyperlipidemia)   . HTN (hypertension)   . IBS (irritable bowel syndrome)   . Reflux esophagitis    PPI  . Sickle cell trait ([place])   . 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 Thyroid nodule 06/2011   R nodule, FNA biopsy = benign follicular, stable [place] (86/5784 and 12/2012)    Past Surgical History:  Procedure Laterality Date  . COLONOSCOPY  07/2011   hemorrhoids, rpt due 5 yrs given h/o polyps  . COLONOSCOPY WITH PROPOFOL N/A 05/29/2016   TA, rpt 5 yrs Jonathon Bellows, MD)  . thyroid US  12/2011   1cm solid nodule lower R pole (stable), no significant nodule on left side, rec rpt [place] in 1 yr  . TVS  11/2008   multiple uterine fibroids    Social History   Socioeconomic History  . Marital status: Married    Spouse name: Not on file  . Number of children: 1  . Years of education: Not on file  .  Highest education level: Not on file  Occupational History  . Occupation: Production manager: [employer]  Social Needs  . Financial resource strain: Not on file  . Food insecurity:    Worry: Not on file    Inability: Not on file  . Transportation needs:    Medical: Not on file    Non-medical: Not on file  Tobacco Use  . Smoking status: Never Smoker  . Smokeless tobacco: Never Used  Substance and Sexual Activity  . Alcohol use: Yes    Alcohol/week: 0.6 oz    Types: 1 Glasses of wine per week    Comment: occasional 2 xyearly  . Drug use:  No  . Sexual activity: Not on file  Lifestyle  . Physical activity:    Days per week: Not on file    Minutes per session: Not on file  . Stress: Not on file  Relationships  . Social connections:    Talks on phone: Not on file    Gets together: Not on file    Attends religious service: Not on file    Active member of club or organization: Not on file    Attends meetings of clubs or organizations: Not on file    Relationship status: Not on file  . Intimate partner violence:    Fear of current or ex partner: Not on file    Emotionally abused: Not on file    Physically abused: Not on file    Forced sexual activity: Not on file  Other Topics Concern  . Not on file  Social History Narrative   Caffeine: rare   Lives alone, no pets. Grown son 87yo, grandson died suddenly at 1yo 2010 (drowning) - lots of stress from this.   Occupation: Tour manager, works third shift   Activity: no regular exercise but bought treadmill   Diet: good water, fruits daily, vegetables occasionally    Family History  Problem Relation Age of Onset  . Heart disease Mother   . Deep vein thrombosis Mother   . Dementia Mother 13  . Heart disease Father        pig valve  . Coronary artery disease Father   . Cancer Father        prostate  . Cancer Maternal Grandmother        colon  . Stroke Maternal Grandfather   . Diabetes Sister   . Mental illness Sister        schizophrenia  . Breast cancer Sister 23  . Cancer Sister        breast  . Cancer Brother        appendix tumor, prostate  . Diabetes Brother   . Mental illness Brother        bipolar  . Cancer Maternal Aunt        bone  . Cancer Maternal Uncle        bladder  . Coronary artery disease Paternal Grandfather   . Cancer Cousin        ovarian, bone    Allergies  Allergen Reactions  . Neosporin [Neomycin-Bacitracin Zn-Polymyx] Rash    Medication list reviewed and updated in full in [place].  ROS: GEN: Acute illness  details above GI: Tolerating PO intake GU: maintaining adequate hydration and urination Pulm: No SOB Interactive and getting along well at home.  Otherwise, ROS is as per the HPI.  Objective:   BP (!) 140/100   Pulse 79  Temp 98.6 F (37 C) (Oral)   Ht 5' 6.5" (1.689 m)   Wt 166 lb 12 oz (75.6 kg)   SpO2 97%   BMI 26.51 kg/m    GEN: A and O x 3. WDWN. NAD.    ENT: Nose clear, ext NML.  No LAD.  No JVD.  TM's clear. Oropharynx clear.  PULM: Normal WOB, no distress. No crackles, wheezes, rhonchi. CV: RRR, no M/G/R, No rubs, No JVD.   EXT: warm and well-perfused, No c/c/e. PSYCH: Pleasant and conversant.   Laboratory and Imaging Data: No results found.  Assessment and Plan:   Acute bronchitis due to other specified organisms  At this point, we reviewed supportive care. Given the length of time and risk factors, treat with medications below.  Medical decision making includes all plans, orders, medications, and patient instructions reviewed face to face.   Follow-up: No follow-ups on file.  Meds ordered this encounter  Medications  . doxycycline (VIBRA-TABS) 100 MG tablet    Sig: Take 1 tablet (100 mg total) by mouth 2 (two) times daily for 10 days.    Dispense:  20 tablet    Refill:  0  . HYDROcodone-Chlorpheniramine 5-4 MG/5ML SOLN    Sig: Take 5 mLs by mouth at bedtime as needed (cough).    Dispense:  120 mL    Refill:  0    Signed,  Fantasy Donald T. Kenzey Birkland, MD   Patient's Medications  New Prescriptions   DOXYCYCLINE (VIBRA-TABS) 100 MG TABLET    Take 1 tablet (100 mg total) by mouth 2 (two) times daily for 10 days.   HYDROCODONE-CHLORPHENIRAMINE 5-4 MG/5ML SOLN    Take 5 mLs by mouth at bedtime as needed (cough).  Previous Medications   ASPIRIN 81 MG TABLET    Take 81 mg by mouth daily.   ATENOLOL (TENORMIN) 50 MG TABLET    Take 1 tablet (50 mg total) by mouth 2 (two) times daily.   CHERATUSSIN AC 100-10 MG/5ML SYRUP    TAKE 5ML BY MOUTH 2 TIMES DAILY AS  NEEDED FOR COUGH. SEDATION PRECAUTION   DIPHENHYDRAMINE (BENADRYL) 25 MG TABLET    Take 25 mg by mouth at bedtime as needed.   MISC NATURAL PRODUCTS (APPLE CIDER VINEGAR) TABS    Take by mouth daily.   MULTIPLE VITAMIN (MULTIVITAMIN) TABLET    Take 1 tablet by mouth daily.   RED YEAST RICE 600 MG CAPS    Take 1 capsule (600 mg total) by mouth daily.   VALACYCLOVIR (VALTREX) 500 MG TABLET    Take 1 tablet (500 mg total) by mouth 2 (two) times daily. For 3 days  Modified Medications   No medications on file  Discontinued Medications   No medications on file

## 2017-07-03 NOTE — Telephone Encounter (Signed)
Copied from [place]. Topic: Quick Communication - See Telephone Encounter >> Jul 03, 2017 10:46 AM Ivar Drape wrote: CRM for notification. See Telephone encounter for: 07/03/17. [facility] [phone] said they are out of the prescription for HYDROcodone-Chlorpheniramine 5-4 MG/5ML SOLN, and there is no pharmacy in a 30 miles radius that has it. They can order it, but it will take about 3 days to receive. Please advise if you prefer another prescription.  If so please send it to the pharmacy.

## 2017-07-03 NOTE — Telephone Encounter (Signed)
Copied from [place] [phone]. Topic: Quick Communication - See Telephone Encounter >> Jul 03, 2017  4:53 PM Arletha Grippe wrote: CRM for notification. See Telephone encounter for: 07/03/17.  Pharm called - they need  either a hard copy of rx for HYDROcodone-Chlorpheniramine 5-4 MG/5ML SOLN or an electronic script sent over [phone]

## 2017-07-03 NOTE — Telephone Encounter (Signed)
Ok  Can you check and see if they have hycodan and convert to this instead?

## 2017-07-03 NOTE — Addendum Note (Signed)
Addended by: Carter Kitten on: 07/03/2017 04:42 PM   Modules accepted: Orders

## 2017-07-04 ENCOUNTER — Telehealth: Payer: Self-pay | Admitting: Family Medicine

## 2017-07-04 ENCOUNTER — Other Ambulatory Visit: Payer: Self-pay | Admitting: Family Medicine

## 2017-07-04 MED ORDER — HYDROCOD POLST-CPM POLST ER 10-8 MG/5ML PO SUER: 5.0000 mL | Freq: Every evening | ORAL | 0 refills | Status: DC | PRN

## 2017-07-04 MED ORDER — HYDROCODONE-HOMATROPINE 5-1.5 MG/5ML PO SYRP: 5.0000 mL | ORAL_SOLUTION | Freq: Every evening | ORAL | 0 refills | Status: DC | PRN

## 2017-07-04 MED ORDER — HYDROCODONE-CHLORPHENIRAMINE 5-4 MG/5ML PO SOLN: 5.0000 mL | Freq: Every evening | ORAL | 0 refills | Status: DC | PRN

## 2017-07-04 NOTE — Telephone Encounter (Signed)
I spook with Counselling psychologist at [facility] and they do not have hydrocodone Chlorpheniramine 5 -4 mg / 5 ml. Kendra Jackson said pt wants Tussionex.Please advise.

## 2017-07-04 NOTE — Telephone Encounter (Addendum)
Kendra Jackson stopped by office and now does not want to wait 3 days  for the pharmacy to get  hydrocodone-chlorpheniramine in stock.  She would like [place] to resend [facility] to [facility].

## 2017-07-04 NOTE — Progress Notes (Signed)
See my prior notes. Per my notes and information, no tussionex was available for a 30 mile radius.

## 2017-07-04 NOTE — Telephone Encounter (Signed)
Copied from [place] [phone]. Topic: Quick Communication - See Telephone Encounter >> Jul 04, 2017  3:26 PM Boyd Kerbs wrote: CRM for notification.   [facility] [phone] cough medicine - tutussionex -  she does NOT  want the HYDROcodone-homatropine (HYCODAN) 5-1.5 MG/5ML syrup   Pharmacist having hard time with patient , please call asap   Please call the pharmacist   See Telephone encounter for: 07/04/17.

## 2017-07-04 NOTE — Telephone Encounter (Signed)
done

## 2017-07-04 NOTE — Addendum Note (Signed)
Addended by: Carter Kitten on: 07/04/2017 02:47 PM   Modules accepted: Orders

## 2017-07-04 NOTE — Telephone Encounter (Signed)
Large-scale communication problems in this case. Tussionex is available per my conversation with the PharmD.  She will delete all other prescriptions, and I will send in 120 mL of Tussionex.

## 2017-07-22 ENCOUNTER — Ambulatory Visit: Payer: Federal, State, Local not specified - PPO | Admitting: Family Medicine

## 2017-07-22 ENCOUNTER — Encounter: Payer: Self-pay | Admitting: Family Medicine

## 2017-07-22 VITALS — BP 124/82 | HR 72 | Temp 98.8°F | Ht 65.5 in | Wt 168.0 lb

## 2017-07-22 DIAGNOSIS — J0100 Acute maxillary sinusitis, unspecified: Secondary | ICD-10-CM

## 2017-07-22 DIAGNOSIS — J019 Acute sinusitis, unspecified: Secondary | ICD-10-CM | POA: Insufficient documentation

## 2017-07-22 MED ORDER — AMOXICILLIN-POT CLAVULANATE 875-125 MG PO TABS: 1.0000 | ORAL_TABLET | Freq: Two times a day (BID) | ORAL | 0 refills | Status: AC

## 2017-07-22 NOTE — Patient Instructions (Addendum)
I think you have a sinus infection. Take medicine as prescribed: augmentin antibiotic.  Start taking plain mucinex with large glass of water to break up mucous.  Do plain nasal saline irrigation throughout the day.  May take ibuprofen for sinus inflammation 2 over the counter pills with meals for next few days.  Push fluids and plenty of rest. Please let us know if fever >101.5, trouble opening/closing mouth, difficulty swallowing, or worsening instead of improving as expected.

## 2017-07-22 NOTE — Assessment & Plan Note (Signed)
Anticipate has developed maxillary sinusitis not improved with initial doxycycline course. Will Rx augmentin 10d course, further supportive care as per instructions (nasal saline, mucinex, ibuprofen). Pt declines oral prednisone course, and states that intranasal steroids may have caused worsening nose irritation in the past. Update if not improving with treatment.

## 2017-07-22 NOTE — Progress Notes (Signed)
BP 124/82 (BP Location: Left Arm, Patient Position: Sitting, Cuff Size: Normal)   Pulse 72   Temp 98.8 F (37.1 C) (Oral)   Ht 5' 5.5" (1.664 m)   Wt 168 lb (76.2 kg)   SpO2 97%   BMI 27.53 kg/m    CC: bronchitis? Subjective:    Patient ID: Kendra Jackson, female    DOB: 03/30/1960, 57 y.o.   MRN: [id number]  HPI: Kendra Jackson is a 57 y.o. female presenting on 07/22/2017 for URI (Was seen on 07/03/17 for similar sxs and given abx. States it did not help. Now has sinus pain in face, teeth and ears. Also, has productive cough with yellow mucous, which has a bad taste. )   Seen here 07/03/2017 by Dr Lorelei Pont with dx bronchitis treated with doxycycline 10d course and hycodan cough syrup. Never felt much better. Acute worsening over the weekend. Rhinorrhea, sneezing, productive cough of yellow mucous, facial pain, tooth pain, exertion and diaphoresis with exertion. Feverish, chills. Head > chest congestion.   Son has been sick recently.  No known h/o asthma. Cough syrup has helped with cough.   Relevant past medical, surgical, family and social history reviewed and updated as indicated. Interim medical history since our last visit reviewed. Allergies and medications reviewed and updated. Outpatient Medications Prior to Visit  Medication Sig Dispense Refill  . aspirin 81 MG tablet Take 81 mg by mouth daily.    Marland Kitchen atenolol (TENORMIN) 50 MG tablet Take 1 tablet (50 mg total) by mouth 2 (two) times daily. 180 tablet 3  . chlorpheniramine-HYDROcodone (TUSSIONEX PENNKINETIC ER) 10-8 MG/5ML SUER Take 5 mLs by mouth at bedtime as needed for cough. 120 mL 0  . diphenhydrAMINE (BENADRYL) 25 MG tablet Take 25 mg by mouth at bedtime as needed.    . Misc Natural Products (APPLE CIDER VINEGAR) TABS Take by mouth daily.    . Multiple Vitamin (MULTIVITAMIN) tablet Take 1 tablet by mouth daily.    . Red Yeast Rice 600 MG CAPS Take 1 capsule (600 mg total) by mouth daily.    . valACYclovir (VALTREX) 500  MG tablet Take 1 tablet (500 mg total) by mouth 2 (two) times daily. For 3 days 30 tablet 1   No facility-administered medications prior to visit.      Per HPI unless specifically indicated in ROS section below Review of Systems     Objective:    BP 124/82 (BP Location: Left Arm, Patient Position: Sitting, Cuff Size: Normal)   Pulse 72   Temp 98.8 F (37.1 C) (Oral)   Ht 5' 5.5" (1.664 m)   Wt 168 lb (76.2 kg)   SpO2 97%   BMI 27.53 kg/m   Wt Readings from Last 3 Encounters:  07/22/17 168 lb (76.2 kg)  07/03/17 166 lb 12 oz (75.6 kg)  01/03/17 167 lb 8 oz (76 kg)    Physical Exam  Constitutional: She appears well-developed and well-nourished. No distress.  HENT:  Head: Normocephalic and atraumatic.  Right Ear: Hearing, external ear and ear canal normal.  Left Ear: Hearing, external ear and ear canal normal.  Nose: Mucosal edema (swollen turbinates bilaterally) and rhinorrhea present. Right sinus exhibits no maxillary sinus tenderness and no frontal sinus tenderness. Left sinus exhibits no maxillary sinus tenderness and no frontal sinus tenderness.  Mouth/Throat: Uvula is midline, oropharynx is clear and moist and mucous membranes are normal. No oropharyngeal exudate, posterior oropharyngeal edema, posterior oropharyngeal erythema or tonsillar abscesses.  congestion behind TMs bilaterally  Eyes: Pupils are equal, round, and reactive to light. Conjunctivae and EOM are normal. No scleral icterus.  Neck: Normal range of motion. Neck supple.  Cardiovascular: Normal rate, regular rhythm, normal heart sounds and intact distal pulses.  No murmur heard. Pulmonary/Chest: Effort normal and breath sounds normal. No respiratory distress. She has no wheezes. She has no rales.  Lymphadenopathy:    She has no cervical adenopathy.  Skin: Skin is warm and dry. No rash noted.  Nursing note and vitals reviewed.  Results for orders placed or performed in visit on 01/14/17  [facility] MAMMOGRAPHY    Result Value Ref Range   HM Mammogram 0-4 Bi-Rad 0-4 Bi-Rad, Self Reported Normal      Assessment & Plan:   Problem List Items Addressed This Visit    Acute sinusitis - Primary    Anticipate has developed maxillary sinusitis not improved with initial doxycycline course. Will Rx augmentin 10d course, further supportive care as per instructions (nasal saline, mucinex, ibuprofen). Pt declines oral prednisone course, and states that intranasal steroids may have caused worsening nose irritation in the past. Update if not improving with treatment.       Relevant Medications   amoxicillin-clavulanate (AUGMENTIN) 875-125 MG tablet       Meds ordered this encounter  Medications  . amoxicillin-clavulanate (AUGMENTIN) 875-125 MG tablet    Sig: Take 1 tablet by mouth 2 (two) times daily for 10 days.    Dispense:  20 tablet    Refill:  0   No orders of the defined types were placed in this encounter.   Follow up plan: No follow-ups on file.  Ria Bush, MD

## 2017-10-31 IMAGING — US US SOFT TISSUE HEAD/NECK
1 series · 14 of 25 positions shown · non-contrast
Comparison: 01/05/2015 and previous

CLINICAL DATA: Nodules. Previous FNA biopsy of dominant right
nodule 07/10/2011.

EXAM:
THYROID ULTRASOUND
TECHNIQUE: Ultrasound examination of the thyroid gland and adjacent soft
tissues was performed.

[Series 1: us soft tissue head/neck · 0.07mm/px · 14 of 75 slices shown]
[im 1/75]
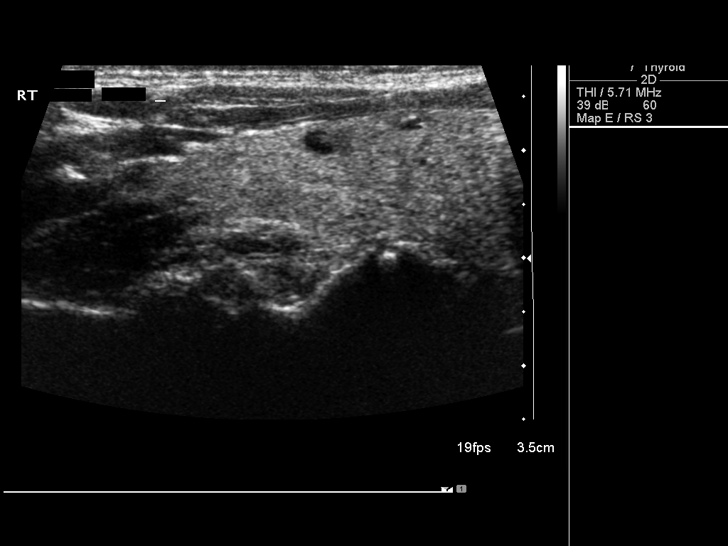
[im 7/75]
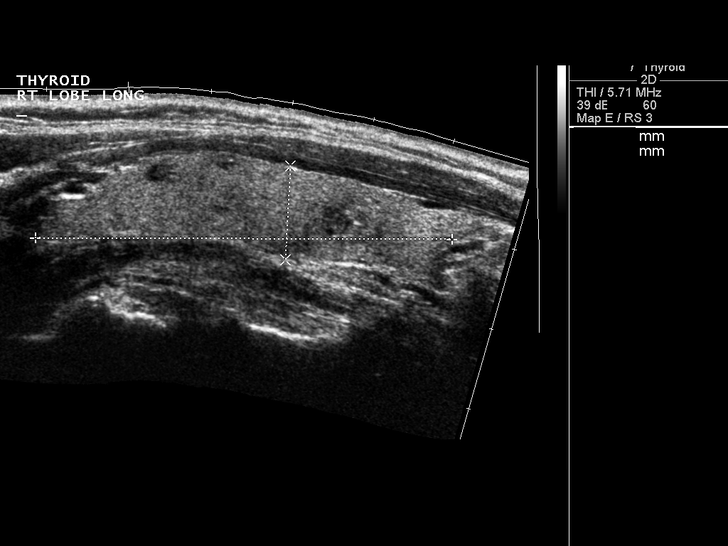
[im 13/75]
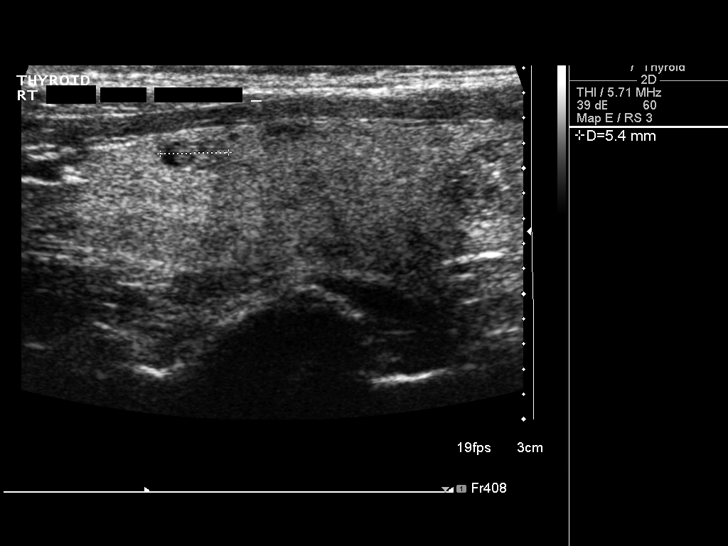
[im 19/75]
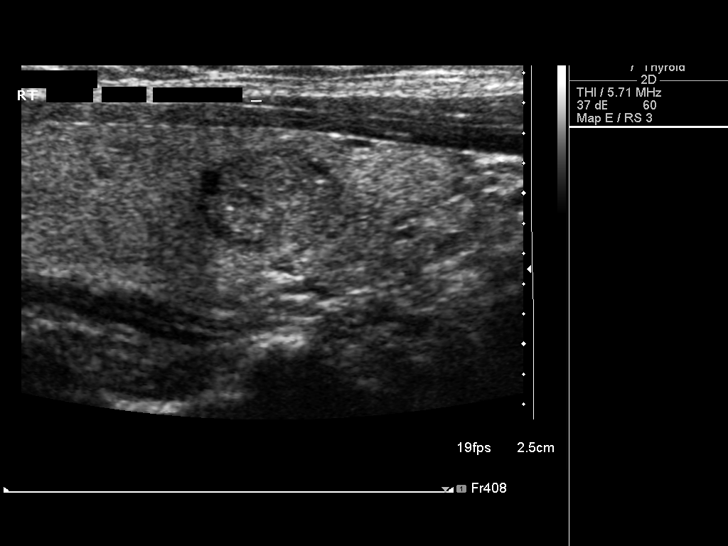
[im 25/75]
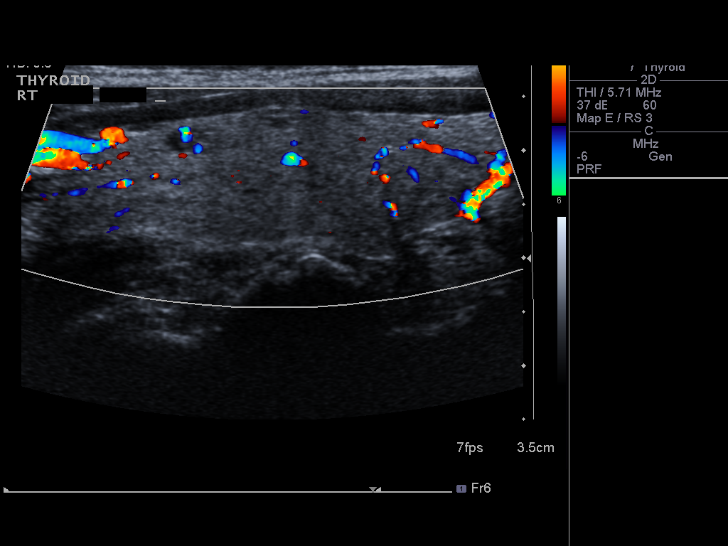
[im 28/75]
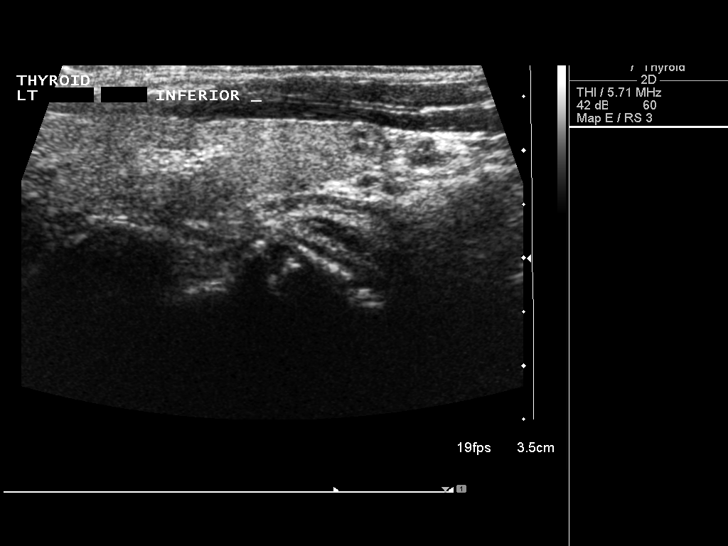
[im 34/75]
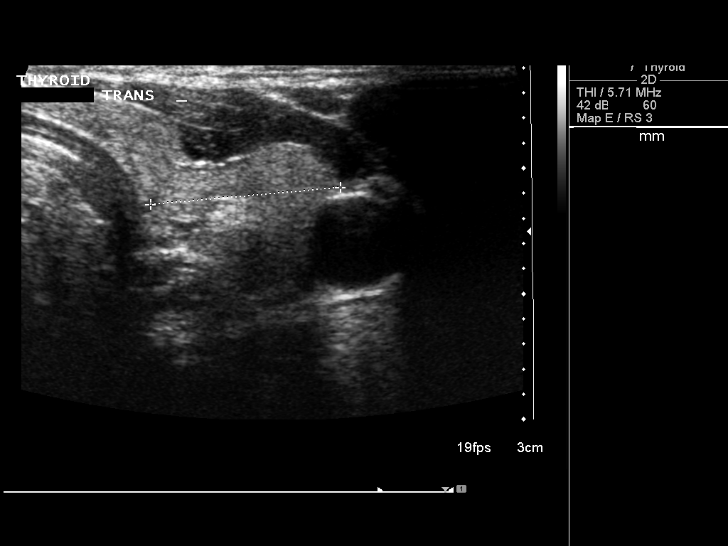
[im 41/75]
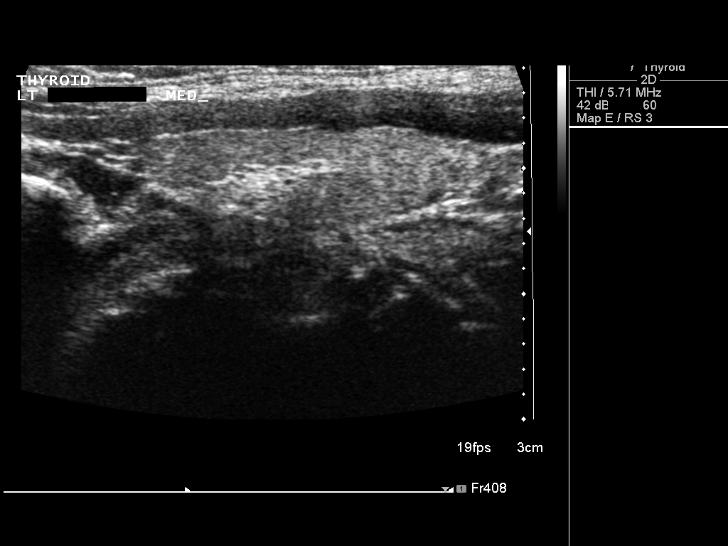
[im 47/75]
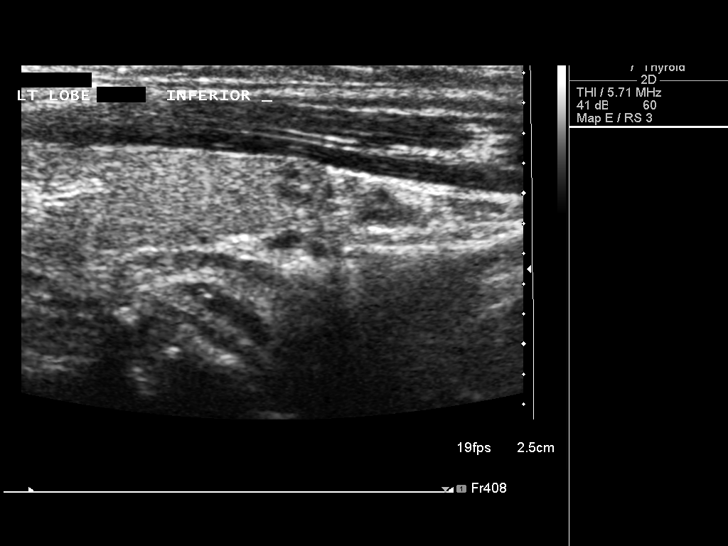
[im 50/75]
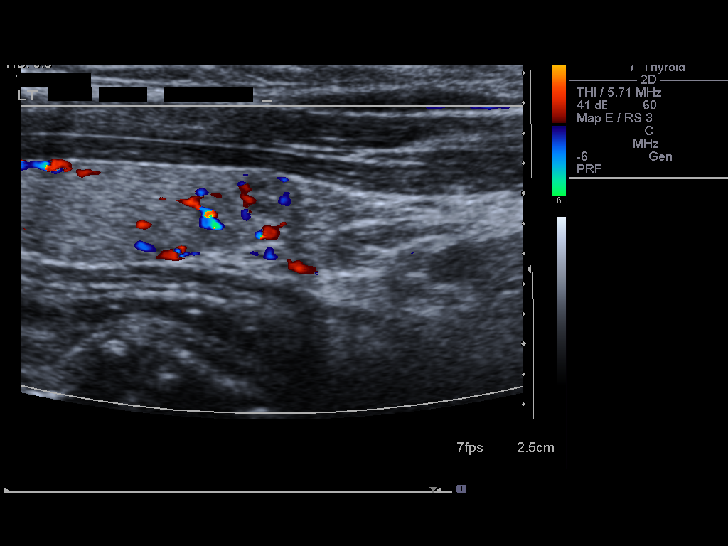
[im 56/75]
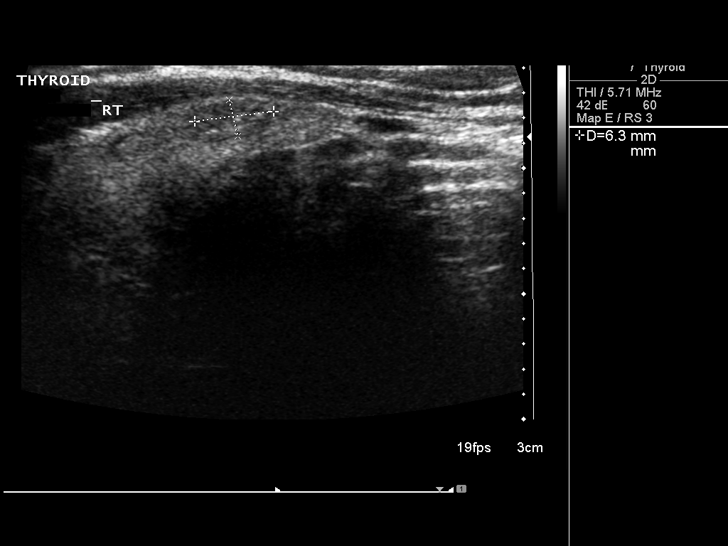
[im 62/75]
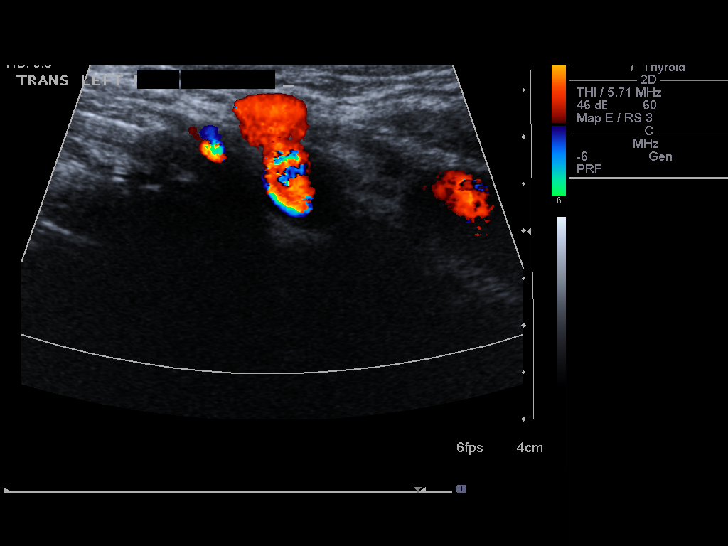
[im 68/75]
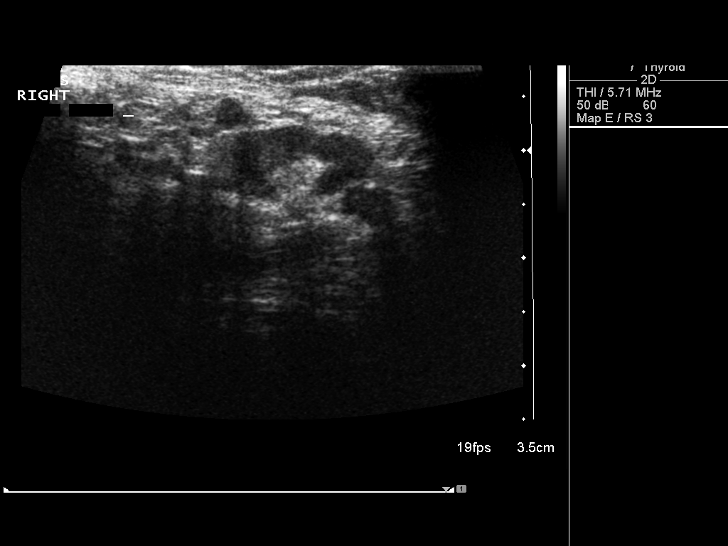
[im 75/75]
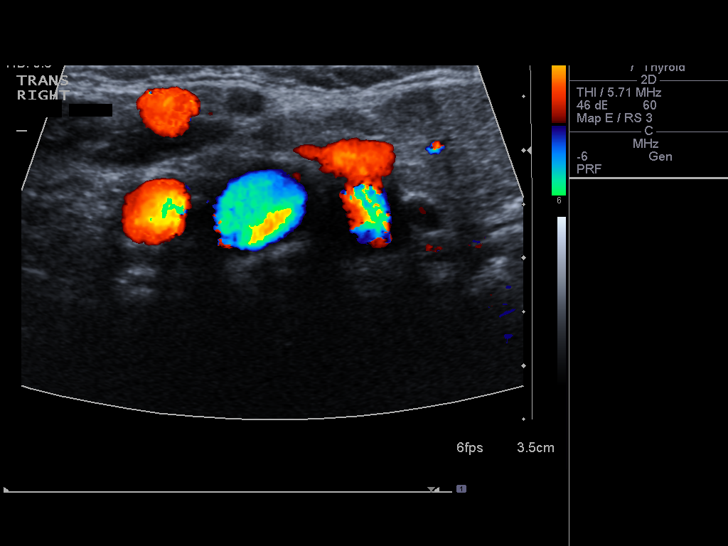

[14 of 25 positions shown; findings below may reference images not displayed]

FINDINGS: Right thyroid lobe

Measurements: 5 x 1.1 x 1.8 cm. 1 x 0.7 x 1 cm nodule with punctate
echogenic foci, inferior pole (previously 1 cm). Additional
subcentimeter nodules.

Left thyroid lobe

Measurements: 5.5 x 1.1 x 1.5 cm. 0.5 cm complex nodule, inferior
pole.

Isthmus

Thickness: 0.2 cm.  No nodules visualized.

Lymphadenopathy

None visualized.
IMPRESSION: 1. Borderline thyromegaly with stable 1 cm right inferior nodule.
Correlate with previous biopsy results.

## 2017-11-29 ENCOUNTER — Ambulatory Visit: Payer: Federal, State, Local not specified - PPO | Admitting: Family Medicine

## 2018-01-04 ENCOUNTER — Other Ambulatory Visit: Payer: Self-pay | Admitting: Family Medicine

## 2018-01-04 DIAGNOSIS — E0590 Thyrotoxicosis, unspecified without thyrotoxic crisis or storm: Secondary | ICD-10-CM

## 2018-01-04 DIAGNOSIS — I10 Essential (primary) hypertension: Secondary | ICD-10-CM

## 2018-01-04 DIAGNOSIS — E785 Hyperlipidemia, unspecified: Secondary | ICD-10-CM

## 2018-01-06 ENCOUNTER — Encounter (INDEPENDENT_AMBULATORY_CARE_PROVIDER_SITE_OTHER): Payer: Self-pay

## 2018-01-06 ENCOUNTER — Other Ambulatory Visit (INDEPENDENT_AMBULATORY_CARE_PROVIDER_SITE_OTHER): Payer: Federal, State, Local not specified - PPO

## 2018-01-06 DIAGNOSIS — I10 Essential (primary) hypertension: Secondary | ICD-10-CM | POA: Diagnosis not present

## 2018-01-06 DIAGNOSIS — E0590 Thyrotoxicosis, unspecified without thyrotoxic crisis or storm: Secondary | ICD-10-CM | POA: Diagnosis not present

## 2018-01-06 DIAGNOSIS — E785 Hyperlipidemia, unspecified: Secondary | ICD-10-CM

## 2018-01-06 LAB — COMPREHENSIVE METABOLIC PANEL
ALBUMIN: 4.5 g/dL (ref 3.5–5.2)
ALT: 14 U/L (ref 0–35)
AST: 17 U/L (ref 0–37)
Alkaline Phosphatase: 81 U/L (ref 39–117)
BUN: 15 mg/dL (ref 6–23)
CALCIUM: 9.6 mg/dL (ref 8.4–10.5)
CO2: 31 mEq/L (ref 19–32)
Chloride: 104 mEq/L (ref 96–112)
Creatinine, Ser: 0.89 mg/dL (ref 0.40–1.20)
GFR: 83.44 mL/min (ref >60.00)
Glucose, Bld: 101 mg/dL — ABNORMAL HIGH (ref 70–99)
POTASSIUM: 4.1 meq/L (ref 3.5–5.1)
Sodium: 139 mEq/L (ref 135–145)
TOTAL PROTEIN: 7.9 g/dL (ref 6.0–8.3)
Total Bilirubin: 0.5 mg/dL (ref 0.2–1.2)

## 2018-01-06 LAB — MICROALBUMIN / CREATININE URINE RATIO
CREATININE, U: 151.7 mg/dL
MICROALB UR: 1.5 mg/dL (ref 0.0–1.9)
Microalb Creat Ratio: 1 mg/g (ref 0.0–30.0)

## 2018-01-06 LAB — LIPID PANEL
CHOL/HDL RATIO: 3
CHOLESTEROL: 253 mg/dL — AB (ref 0–200)
HDL: 84.8 mg/dL (ref >39.00)
LDL CALC: 158 mg/dL — AB (ref 0–99)
NonHDL: 168.13
TRIGLYCERIDES: 49 mg/dL (ref 0.0–149.0)
VLDL: 9.8 mg/dL (ref 0.0–40.0)

## 2018-01-06 LAB — T4, FREE: Free T4: 0.73 ng/dL (ref 0.60–1.60)

## 2018-01-06 LAB — TSH: TSH: 0.34 u[IU]/mL — ABNORMAL LOW (ref 0.35–4.50)

## 2018-01-07 LAB — T3: T3 TOTAL: 107 ng/dL (ref 76–181)

## 2018-01-09 ENCOUNTER — Encounter: Payer: Self-pay | Admitting: Family Medicine

## 2018-01-09 ENCOUNTER — Ambulatory Visit (INDEPENDENT_AMBULATORY_CARE_PROVIDER_SITE_OTHER): Payer: Federal, State, Local not specified - PPO | Admitting: Family Medicine

## 2018-01-09 ENCOUNTER — Other Ambulatory Visit (HOSPITAL_COMMUNITY)
Admission: RE | Admit: 2018-01-09 | Discharge: 2018-01-09 | Disposition: A | Discharge status: Home / Self Care | Payer: Federal, State, Local not specified - PPO | Source: Ambulatory Visit | Attending: Family Medicine | Admitting: Family Medicine

## 2018-01-09 VITALS — BP 144/100 | HR 84 | Temp 98.5°F | Ht 66.5 in | Wt 172.0 lb

## 2018-01-09 DIAGNOSIS — E0590 Thyrotoxicosis, unspecified without thyrotoxic crisis or storm: Secondary | ICD-10-CM | POA: Diagnosis not present

## 2018-01-09 DIAGNOSIS — Z01419 Encounter for gynecological examination (general) (routine) without abnormal findings: Secondary | ICD-10-CM | POA: Insufficient documentation

## 2018-01-09 DIAGNOSIS — I10 Essential (primary) hypertension: Secondary | ICD-10-CM

## 2018-01-09 DIAGNOSIS — Z23 Encounter for immunization: Secondary | ICD-10-CM | POA: Diagnosis not present

## 2018-01-09 DIAGNOSIS — E042 Nontoxic multinodular goiter: Secondary | ICD-10-CM | POA: Diagnosis not present

## 2018-01-09 DIAGNOSIS — Z0000 Encounter for general adult medical examination without abnormal findings: Secondary | ICD-10-CM | POA: Diagnosis not present

## 2018-01-09 DIAGNOSIS — E785 Hyperlipidemia, unspecified: Secondary | ICD-10-CM

## 2018-01-09 DIAGNOSIS — N882 Stricture and stenosis of cervix uteri: Secondary | ICD-10-CM

## 2018-01-09 MED ORDER — ATENOLOL 50 MG PO TABS: 50.0000 mg | ORAL_TABLET | Freq: Two times a day (BID) | ORAL | 3 refills | Status: DC

## 2018-01-09 MED ORDER — RED YEAST RICE 600 MG PO CAPS: 1.0000 | ORAL_CAPSULE | Freq: Two times a day (BID) | ORAL | Status: DC

## 2018-01-09 MED ORDER — VALACYCLOVIR HCL 500 MG PO TABS: 500.0000 mg | ORAL_TABLET | Freq: Two times a day (BID) | ORAL | 3 refills | Status: DC

## 2018-01-09 NOTE — Assessment & Plan Note (Signed)
Noted today.  

## 2018-01-09 NOTE — Assessment & Plan Note (Addendum)
Chronic, above goal today - discussed goal blood pressures. She will start monitoring at home and let me know if persistently elevated to add second medication. RTC 4 mo f/u visit.

## 2018-01-09 NOTE — Patient Instructions (Addendum)
Flu shot today Double up on red yeast rice twice daily. Let me know when you're running low and we can start lipitor cholesterol medicine.  Start monitoring blood pressures locally - let me know if consistently >140/90 to add second blood pressure medicine in addition to atenolol.  Work on low salt/sodium diet - goal <1.5gm (1,558m) per day. Eat a diet high in fruits/vegetables and whole grains.  Look into mediterranean and DASH diet. Goal activity is 1543m/wk of moderate intensity exercise.  This can be split into 30 minute chunks.  If you are not at this level, you can start with smaller 10-15 min increments and slowly build up activity. Look at [employer] for more resources  Return in 4 months for follow up visit.   Health Maintenance, Female Adopting a healthy lifestyle and getting preventive care can go a long way to promote health and wellness. Talk with your health care provider about what schedule of regular examinations is right for you. This is a good chance for you to check in with your provider about disease prevention and staying healthy. In between checkups, there are plenty of things you can do on your own. Experts have done a lot of research about which lifestyle changes and preventive measures are most likely to keep you healthy. Ask your health care provider for more information. Weight and diet Eat a healthy diet  Be sure to include plenty of vegetables, fruits, low-fat dairy products, and lean protein.  Do not eat a lot of foods high in solid fats, added sugars, or salt.  Get regular exercise. This is one of the most important things you can do for your health. ? Most adults should exercise for at least 150 minutes each week. The exercise should increase your heart rate and make you sweat (moderate-intensity exercise). ? Most adults should also do strengthening exercises at least twice a week. This is in addition to the moderate-intensity exercise.  Maintain a healthy  weight  Body mass index (BMI) is a measurement that can be used to identify possible weight problems. It estimates body fat based on height and weight. Your health care provider can help determine your BMI and help you achieve or maintain a healthy weight.  For females 2043ears of age and older: ? A BMI below 18.5 is considered underweight. ? A BMI of 18.5 to 24.9 is normal. ? A BMI of 25 to 29.9 is considered overweight. ? A BMI of 30 and above is considered obese.  Watch levels of cholesterol and blood lipids  You should start having your blood tested for lipids and cholesterol at 2031ears of age, then have this test every 5 years.  You may need to have your cholesterol levels checked more often if: ? Your lipid or cholesterol levels are high. ? You are older than 5052ears of age. ? You are at high risk for heart disease.  Cancer screening Lung Cancer  Lung cancer screening is recommended for adults [id number] old who are at high risk for lung cancer because of a history of smoking.  A yearly low-dose CT scan of the lungs is recommended for people who: ? Currently smoke. ? Have quit within the past 15 years. ? Have at least a 30-pack-year history of smoking. A pack year is smoking an average of one pack of cigarettes a day for 1 year.  Yearly screening should continue until it has been 15 years since you quit.  Yearly screening should stop if  you develop a health problem that would prevent you from having lung cancer treatment.  Breast Cancer  Practice breast self-awareness. This means understanding how your breasts normally appear and feel.  It also means doing regular breast self-exams. Let your health care provider know about any changes, no matter how small.  If you are in your 20s or 30s, you should have a clinical breast exam (CBE) by a health care provider every 1-3 years as part of a regular health exam.  If you are 65 or older, have a CBE every year. Also consider  having a breast X-ray (mammogram) every year.  If you have a family history of breast cancer, talk to your health care provider about genetic screening.  If you are at high risk for breast cancer, talk to your health care provider about having an MRI and a mammogram every year.  Breast cancer gene (BRCA) assessment is recommended for women who have family members with BRCA-related cancers. BRCA-related cancers include: ? Breast. ? Ovarian. ? Tubal. ? Peritoneal cancers.  Results of the assessment will determine the need for genetic counseling and BRCA1 and BRCA2 testing.  Cervical Cancer Your health care provider may recommend that you be screened regularly for cancer of the pelvic organs (ovaries, uterus, and vagina). This screening involves a pelvic examination, including checking for microscopic changes to the surface of your cervix (Pap test). You may be encouraged to have this screening done every 3 years, beginning at age 69.  For women ages 56-65, health care providers may recommend pelvic exams and Pap testing every 3 years, or they may recommend the Pap and pelvic exam, combined with testing for human papilloma virus (HPV), every 5 years. Some types of HPV increase your risk of cervical cancer. Testing for HPV may also be done on women of any age with unclear Pap test results.  Other health care providers may not recommend any screening for nonpregnant women who are considered low risk for pelvic cancer and who do not have symptoms. Ask your health care provider if a screening pelvic exam is right for you.  If you have had past treatment for cervical cancer or a condition that could lead to cancer, you need Pap tests and screening for cancer for at least 20 years after your treatment. If Pap tests have been discontinued, your risk factors (such as having a new sexual partner) need to be reassessed to determine if screening should resume. Some women have medical problems that increase  the chance of getting cervical cancer. In these cases, your health care provider may recommend more frequent screening and Pap tests.  Colorectal Cancer  This type of cancer can be detected and often prevented.  Routine colorectal cancer screening usually begins at 59 years of age and continues through 59 years of age.  Your health care provider may recommend screening at an earlier age if you have risk factors for colon cancer.  Your health care provider may also recommend using home test kits to check for hidden blood in the stool.  A small camera at the end of a tube can be used to examine your colon directly (sigmoidoscopy or colonoscopy). This is done to check for the earliest forms of colorectal cancer.  Routine screening usually begins at age 56.  Direct examination of the colon should be repeated every 5-10 years through 59 years of age. However, you may need to be screened more often if early forms of precancerous polyps or small growths are  found.  Skin Cancer  Check your skin from head to toe regularly.  Tell your health care provider about any new moles or changes in moles, especially if there is a change in a mole's shape or color.  Also tell your health care provider if you have a mole that is larger than the size of a pencil eraser.  Always use sunscreen. Apply sunscreen liberally and repeatedly throughout the day.  Protect yourself by wearing long sleeves, pants, a wide-brimmed hat, and sunglasses whenever you are outside.  Heart disease, diabetes, and high blood pressure  High blood pressure causes heart disease and increases the risk of stroke. High blood pressure is more likely to develop in: ? People who have blood pressure in the high end of the normal range (130-139/85-89 mm Hg). ? People who are overweight or obese. ? People who are African American.  If you are 62-11 years of age, have your blood pressure checked every 3-5 years. If you are 76 years of age  or older, have your blood pressure checked every year. You should have your blood pressure measured twice-once when you are at a hospital or clinic, and once when you are not at a hospital or clinic. Record the average of the two measurements. To check your blood pressure when you are not at a hospital or clinic, you can use: ? An automated blood pressure machine at a pharmacy. ? A home blood pressure monitor.  If you are between 52 years and 16 years old, ask your health care provider if you should take aspirin to prevent strokes.  Have regular diabetes screenings. This involves taking a blood sample to check your fasting blood sugar level. ? If you are at a normal weight and have a low risk for diabetes, have this test once every three years after 59 years of age. ? If you are overweight and have a high risk for diabetes, consider being tested at a younger age or more often. Preventing infection Hepatitis B  If you have a higher risk for hepatitis B, you should be screened for this virus. You are considered at high risk for hepatitis B if: ? You were born in a country where hepatitis B is common. Ask your health care provider which countries are considered high risk. ? Your parents were born in a high-risk country, and you have not been immunized against hepatitis B (hepatitis B vaccine). ? You have HIV or AIDS. ? You use needles to inject street drugs. ? You live with someone who has hepatitis B. ? You have had sex with someone who has hepatitis B. ? You get hemodialysis treatment. ? You take certain medicines for conditions, including cancer, organ transplantation, and autoimmune conditions.  Hepatitis C  Blood testing is recommended for: ? Everyone born from [birth year] through 1967. ? Anyone with known risk factors for hepatitis C.  Sexually transmitted infections (STIs)  You should be screened for sexually transmitted infections (STIs) including gonorrhea and chlamydia if: ? You are  sexually active and are younger than 59 years of age. ? You are older than 59 years of age and your health care provider tells you that you are at risk for this type of infection. ? Your sexual activity has changed since you were last screened and you are at an increased risk for chlamydia or gonorrhea. Ask your health care provider if you are at risk.  If you do not have HIV, but are at risk, it may be recommended  that you take a prescription medicine daily to prevent HIV infection. This is called pre-exposure prophylaxis (PrEP). You are considered at risk if: ? You are sexually active and do not regularly use condoms or know the HIV status of your partner(s). ? You take drugs by injection. ? You are sexually active with a partner who has HIV.  Talk with your health care provider about whether you are at high risk of being infected with HIV. If you choose to begin PrEP, you should first be tested for HIV. You should then be tested every 3 months for as long as you are taking PrEP. Pregnancy  If you are premenopausal and you may become pregnant, ask your health care provider about preconception counseling.  If you may become pregnant, take 400 to 800 micrograms (mcg) of folic acid every day.  If you want to prevent pregnancy, talk to your health care provider about birth control (contraception). Osteoporosis and menopause  Osteoporosis is a disease in which the bones lose minerals and strength with aging. This can result in serious bone fractures. Your risk for osteoporosis can be identified using a bone density scan.  If you are 69 years of age or older, or if you are at risk for osteoporosis and fractures, ask your health care provider if you should be screened.  Ask your health care provider whether you should take a calcium or vitamin D supplement to lower your risk for osteoporosis.  Menopause may have certain physical symptoms and risks.  Hormone replacement therapy may reduce some  of these symptoms and risks. Talk to your health care provider about whether hormone replacement therapy is right for you. Follow these instructions at home:  Schedule regular health, dental, and eye exams.  Stay current with your immunizations.  Do not use any tobacco products including cigarettes, chewing tobacco, or electronic cigarettes.  If you are pregnant, do not drink alcohol.  If you are breastfeeding, limit how much and how often you drink alcohol.  Limit alcohol intake to no more than 1 drink per day for nonpregnant women. One drink equals 12 ounces of beer, 5 ounces of wine, or 1 ounces of hard liquor.  Do not use street drugs.  Do not share needles.  Ask your health care provider for help if you need support or information about quitting drugs.  Tell your health care provider if you often feel depressed.  Tell your health care provider if you have ever been abused or do not feel safe at home. This information is not intended to replace advice given to you by your health care provider. Make sure you discuss any questions you have with your health care provider. Document Released: 09/25/2010 Document Revised: 08/18/2015 Document Reviewed: 12/14/2014 Elsevier Interactive Patient Education  [employer].

## 2018-01-09 NOTE — Progress Notes (Signed)
BP (!) 144/100 (BP Location: Right Arm, Patient Position: Sitting, Cuff Size: Normal)   Pulse 84   Temp 98.5 F (36.9 C) (Oral)   Ht 5' 6.5" (1.689 m)   Wt 172 lb (78 kg)   SpO2 95%   BMI 27.35 kg/m   On repeat, 160/100  CC: CPE Subjective:    Patient ID: Kendra Jackson, female    DOB: [birth year], 57 y.o.   MRN: [id number]  HPI: Kendra Jackson is a 57 y.o. female presenting on 01/09/2018 for Annual Exam (C/o excessive mucous. Also, wants area on irritated are on upper back checked. Thinks it may be a blackhead. ) and Gastroesophageal Reflux (C/o issues with indigestion.)   Preventative: COLONOSCOPY 05/2016 - TA, rpt 5 yrs Vicente Males)  Well woman - pap smear WNL 2016, remote h/o abnormal cells s/p cryotherapy. Update today LMP - 04/12/2009 Mammogram - normal 12/2016. Dense breasts.  Flu shot yearly Tdap 2013  Seat belt use discussed Sunscreen use discussed. No changing moles on skin Non smoker Alcohol - none Dentist yearly Eye exam yearly  Caffeine: rare  Lives alone, no pets. Grown son 76yo, grandson died suddenly at 1yo 2010 (drowning) - lots of stress from this.  Occupation: Tour manager, works third shift - retired 2018! Activity: no regular exercise despite having treadmill, dances every weekend at club  Diet: good water, fruits daily, vegetables occasionally   Relevant past medical, surgical, family and social history reviewed and updated as indicated. Interim medical history since our last visit reviewed. Allergies and medications reviewed and updated. Outpatient Medications Prior to Visit  Medication Sig Dispense Refill  . aspirin 81 MG tablet Take 81 mg by mouth daily.    . diphenhydrAMINE (BENADRYL) 25 MG tablet Take 25 mg by mouth at bedtime as needed.    Marland Kitchen FLAXSEED, LINSEED, PO Take by mouth. Flaxseed powder    . Ginger, Zingiber officinalis, (GINGER ROOT) 550 MG CAPS Take 1 capsule by mouth daily.    . Misc Natural Products (APPLE CIDER VINEGAR) TABS Take by  mouth daily.    . Multiple Vitamin (MULTIVITAMIN) tablet Take 1 tablet by mouth daily.    . TURMERIC PO Take by mouth.    Marland Kitchen atenolol (TENORMIN) 50 MG tablet Take 1 tablet (50 mg total) by mouth 2 (two) times daily. 180 tablet 3  . chlorpheniramine-HYDROcodone (TUSSIONEX PENNKINETIC ER) 10-8 MG/5ML SUER Take 5 mLs by mouth at bedtime as needed for cough. 120 mL 0  . Red Yeast Rice 600 MG CAPS Take 1 capsule (600 mg total) by mouth daily.    . valACYclovir (VALTREX) 500 MG tablet Take 1 tablet (500 mg total) by mouth 2 (two) times daily. For 3 days 30 tablet 1   No facility-administered medications prior to visit.      Per HPI unless specifically indicated in ROS section below Review of Systems  Constitutional: Negative for activity change, appetite change, chills, fatigue, fever and unexpected weight change.  HENT: Negative for hearing loss.   Eyes: Negative for visual disturbance.  Respiratory: Positive for cough (with mucous). Negative for chest tightness, shortness of breath and wheezing.   Cardiovascular: Negative for chest pain, palpitations and leg swelling.  Gastrointestinal: Positive for constipation (chronic). Negative for abdominal distention, abdominal pain, blood in stool, diarrhea, nausea and vomiting.  Genitourinary: Negative for difficulty urinating and hematuria.  Musculoskeletal: Negative for arthralgias, myalgias and neck pain.  Skin: Negative for rash.  Neurological: Negative for dizziness, seizures, syncope and headaches.  Hematological: Negative  for adenopathy. Does not bruise/bleed easily.  Psychiatric/Behavioral: Negative for dysphoric mood. The patient is not nervous/anxious.        Objective:    BP (!) 144/100 (BP Location: Right Arm, Patient Position: Sitting, Cuff Size: Normal)   Pulse 84   Temp 98.5 F (36.9 C) (Oral)   Ht 5' 6.5" (1.689 m)   Wt 172 lb (78 kg)   SpO2 95%   BMI 27.35 kg/m   Wt Readings from Last 3 Encounters:  01/09/18 172 lb (78 kg)    07/22/17 168 lb (76.2 kg)  07/03/17 166 lb 12 oz (75.6 kg)    Physical Exam  Constitutional: She is oriented to person, place, and time. She appears well-developed and well-nourished. No distress.  HENT:  Head: Normocephalic and atraumatic.  Right Ear: Hearing, tympanic membrane, external ear and ear canal normal.  Left Ear: Hearing, tympanic membrane, external ear and ear canal normal.  Nose: Nose normal.  Mouth/Throat: Uvula is midline, oropharynx is clear and moist and mucous membranes are normal. No oropharyngeal exudate, posterior oropharyngeal edema or posterior oropharyngeal erythema.  Eyes: Pupils are equal, round, and reactive to light. Conjunctivae and EOM are normal. No scleral icterus.  Neck: Normal range of motion. Neck supple. No thyromegaly present.  Cardiovascular: Normal rate, regular rhythm, normal heart sounds and intact distal pulses.  No murmur heard. Pulses:      Radial pulses are 2+ on the right side, and 2+ on the left side.  Pulmonary/Chest: Effort normal and breath sounds normal. No respiratory distress. She has no wheezes. She has no rales.  Abdominal: Soft. Bowel sounds are normal. She exhibits no distension and no mass. There is no tenderness. There is no rebound and no guarding.  Genitourinary: Vagina normal and uterus normal. Pelvic exam was performed with patient supine. There is no rash, tenderness, lesion or injury on the right labia. There is no rash, tenderness, lesion or injury on the left labia. Cervix exhibits no motion tenderness. Right adnexum displays no mass and no tenderness. Left adnexum displays no mass and no tenderness.  Genitourinary Comments: Pap performed on cervix Difficult to get endocervical sample due to stenosis  Musculoskeletal: Normal range of motion. She exhibits no edema.  Lymphadenopathy:    She has no cervical adenopathy.  Neurological: She is alert and oriented to person, place, and time.  CN grossly intact, station and gait  intact  Skin: Skin is warm and dry. No rash noted.  Psychiatric: She has a normal mood and affect. Her behavior is normal. Judgment and thought content normal.  Nursing note and vitals reviewed.  Results for orders placed or performed in visit on 01/06/18  Microalbumin / creatinine urine ratio  Result Value Ref Range   Microalb, Ur 1.5 0.0 - 1.9 mg/dL   Creatinine,U 151.7 mg/dL   Microalb Creat Ratio 1.0 0.0 - 30.0 mg/g  T4, free  Result Value Ref Range   Free T4 0.73 0.60 - 1.60 ng/dL  T3  Result Value Ref Range   T3, Total 107 76 - 181 ng/dL  TSH  Result Value Ref Range   TSH 0.34 (L) 0.35 - 4.50 uIU/mL  Comprehensive metabolic panel  Result Value Ref Range   Sodium 139 135 - 145 mEq/L   Potassium 4.1 3.5 - 5.1 mEq/L   Chloride 104 96 - 112 mEq/L   CO2 31 19 - 32 mEq/L   Glucose, Bld 101 (H) 70 - 99 mg/dL   BUN 15 6 - 23  mg/dL   Creatinine, Ser 0.89 0.40 - 1.20 mg/dL   Total Bilirubin 0.5 0.2 - 1.2 mg/dL   Alkaline Phosphatase 81 39 - 117 U/L   AST 17 0 - 37 U/L   ALT 14 0 - 35 U/L   Total Protein 7.9 6.0 - 8.3 g/dL   Albumin 4.5 3.5 - 5.2 g/dL   Calcium 9.6 8.4 - 10.5 mg/dL   GFR 83.44 >60.00 mL/min  Lipid panel  Result Value Ref Range   Cholesterol 253 (H) 0 - 200 mg/dL   Triglycerides 49.0 0.0 - 149.0 mg/dL   HDL 84.80 >39.00 mg/dL   VLDL 9.8 0.0 - 40.0 mg/dL   LDL Cholesterol 158 (H) 0 - 99 mg/dL   Total CHOL/HDL Ratio 3    NonHDL 168.13       Assessment & Plan:   Problem List Items Addressed This Visit    Subclinical hyperthyroidism    Followed by Dr Cruzita Lederer, monitoring Q2 yrs.       Relevant Medications   atenolol (TENORMIN) 50 MG tablet   Multinodular goiter   Relevant Medications   atenolol (TENORMIN) 50 MG tablet   HTN (hypertension)    Chronic, above goal today - discussed goal blood pressures. She will start monitoring at home and let me know if persistently elevated to add second medication. RTC 4 mo f/u visit.       Relevant Medications     atenolol (TENORMIN) 50 MG tablet   HLD (hyperlipidemia)    Chronic, trig elevated despite RYR daily - with fmhx rec atorvastatin 20mg  daily. She just bought RYR - will take BID until runs out then will let us know for new Rx atorvastatin.  The 10-year ASCVD risk score [name] [name]., et al., 2013) is: 8.5%   Values used to calculate the score:     Age: 57 years     Sex: Female     Is Non-Hispanic African American: Yes     Diabetic: No     Tobacco smoker: No     Systolic Blood Pressure: 381 mmHg     Is BP treated: Yes     HDL Cholesterol: 84.8 mg/dL     Total Cholesterol: 253 mg/dL       Relevant Medications   atenolol (TENORMIN) 50 MG tablet   Healthcare maintenance - Primary    Preventative protocols reviewed and updated unless pt declined. Discussed healthy diet and lifestyle.       Cervical stenosis (uterine cervix)    Noted today.        Other Visit Diagnoses    Need for influenza vaccination       Relevant Orders   Flu Vaccine QUAD 36+ mos IM (Completed)   Encounter for annual routine gynecological examination       Relevant Orders   Cytology - PAP       Meds ordered this encounter  Medications  . valACYclovir (VALTREX) 500 MG tablet    Sig: Take 1 tablet (500 mg total) by mouth 2 (two) times daily. For 3 days    Dispense:  30 tablet    Refill:  3  . Red Yeast Rice 600 MG CAPS    Sig: Take 1 capsule (600 mg total) by mouth 2 (two) times daily.  Marland Kitchen atenolol (TENORMIN) 50 MG tablet    Sig: Take 1 tablet (50 mg total) by mouth 2 (two) times daily.    Dispense:  180 tablet    Refill:  3  Orders Placed This Encounter  Procedures  . Flu Vaccine QUAD 36+ mos IM    Follow up plan: Return in about 4 months (around 05/12/2018) for follow up visit.  Ria Bush, MD

## 2018-01-09 NOTE — Assessment & Plan Note (Signed)
Preventative protocols reviewed and updated unless pt declined. Discussed healthy diet and lifestyle.  

## 2018-01-09 NOTE — Assessment & Plan Note (Addendum)
Chronic, trig elevated despite RYR daily - with fmhx rec atorvastatin 20mg  daily. She just bought RYR - will take BID until runs out then will let us know for new Rx atorvastatin.  The 10-year ASCVD risk score [name] [name]., et al., 2013) is: 8.5%   Values used to calculate the score:     Age: 57 years     Sex: Female     Is Non-Hispanic African American: Yes     Diabetic: No     Tobacco smoker: No     Systolic Blood Pressure: 184 mmHg     Is BP treated: Yes     HDL Cholesterol: 84.8 mg/dL     Total Cholesterol: 253 mg/dL

## 2018-01-09 NOTE — Assessment & Plan Note (Signed)
Followed by Dr Cruzita Lederer, monitoring Q2 yrs.

## 2018-01-13 ENCOUNTER — Other Ambulatory Visit: Payer: Self-pay | Admitting: Family Medicine

## 2018-01-13 DIAGNOSIS — Z1231 Encounter for screening mammogram for malignant neoplasm of breast: Secondary | ICD-10-CM

## 2018-01-13 LAB — CYTOLOGY - PAP
ADEQUACY: ABSENT
DIAGNOSIS: NEGATIVE
HPV: NOT DETECTED

## 2018-02-06 ENCOUNTER — Ambulatory Visit
Admission: RE | Admit: 2018-02-06 | Discharge: 2018-02-06 | Disposition: A | Discharge status: Home / Self Care | Payer: Federal, State, Local not specified - PPO | Source: Ambulatory Visit | Attending: Family Medicine | Admitting: Family Medicine

## 2018-02-06 DIAGNOSIS — Z1231 Encounter for screening mammogram for malignant neoplasm of breast: Secondary | ICD-10-CM | POA: Diagnosis not present

## 2018-02-07 LAB — HM MAMMOGRAPHY

## 2018-02-10 ENCOUNTER — Encounter: Payer: Self-pay | Admitting: Family Medicine

## 2018-02-26 ENCOUNTER — Encounter: Payer: Self-pay | Admitting: Primary Care

## 2018-02-26 ENCOUNTER — Ambulatory Visit: Payer: Federal, State, Local not specified - PPO | Admitting: Primary Care

## 2018-02-26 VITALS — BP 134/84 | HR 78 | Temp 98.2°F | Ht 66.5 in | Wt 170.8 lb

## 2018-02-26 DIAGNOSIS — J069 Acute upper respiratory infection, unspecified: Secondary | ICD-10-CM

## 2018-02-26 MED ORDER — HYDROCOD POLST-CPM POLST ER 10-8 MG/5ML PO SUER: 5.0000 mL | Freq: Two times a day (BID) | ORAL | 0 refills | Status: DC | PRN

## 2018-02-26 MED ORDER — FLUTICASONE PROPIONATE 50 MCG/ACT NA SUSP: 2.0000 | Freq: Every day | NASAL | 0 refills | Status: DC

## 2018-02-26 MED ORDER — METHYLPREDNISOLONE ACETATE 80 MG/ML IJ SUSP
80.0000 mg | Freq: Once | INTRAMUSCULAR | Status: AC
  Administered 2018-02-26: 80 mg via INTRAMUSCULAR

## 2018-02-26 NOTE — Progress Notes (Signed)
Subjective:    Patient ID: Kendra Jackson, female    DOB: 1960/09/14, 57 y.o.   MRN: [id number]  HPI  Kendra Jackson is a 57 year old female with a history of hypertension, reactive airway disease, GERD who presents today with a chief complaint of nasal congestion.  She also reports cough, tickle to her throat. Her symptoms began three days ago. Her cough is productive with green/yellow sputum. She's been around her granddaughter who has been ill. She denies fevers.   She's taken Cold-Eaze, Alka-selzter Plus, and prescription cough medication. She has two funerals to attend this coming weekend and wants to be well.   Review of Systems  Constitutional: Negative for fever.  HENT: Positive for congestion. Negative for ear pain, sinus pressure and sore throat.   Respiratory: Positive for cough. Negative for shortness of breath and wheezing.   Cardiovascular: Negative for chest pain.       Past Medical History:  Diagnosis Date  . 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 Abnormal pigmentation of skin 2004   L ear antihelix - bx consistent with SK Kendra Jackson), recheck stable per Kendra Jackson  . Constipation    severe requiring laxatives, failed miralax  . Enlarged thyroid    h/o abnl labs but never treated  . Family history of breast cancer in first degree relative   . History of colon polyps    hyperplastic polyp 2008, 2013 hemorrhoids, rec rpt 5 yrs  . History of herpes genitalis    valtrex prn  . HLD (hyperlipidemia)   . HTN (hypertension)   . IBS (irritable bowel syndrome)   . Reflux esophagitis    PPI  . Sickle cell trait ([place])   . 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 Thyroid nodule 06/2011   R nodule, FNA biopsy = benign follicular, stable [place] (10/6576 and 12/2012)     Social History   Socioeconomic History  . Marital status: Married    Spouse name: Not on file  . Number of children: 1  . Years of  education: Not on file  . Highest education level: Not on file  Occupational History  . Occupation: Production manager: [employer]  Social Needs  . Financial resource strain: Not on file  . Food insecurity:    Worry: Not on file    Inability: Not on file  . Transportation needs:    Medical: Not on file    Non-medical: Not on file  Tobacco Use  . Smoking status: Never Smoker  . Smokeless tobacco: Never Used  Substance and Sexual Activity  . Alcohol use: Yes    Alcohol/week: 1.0 standard drinks    Types: 1 Glasses of wine per week    Comment: occasional 2 xyearly  . Drug use: No  . Sexual activity: Not on file  Lifestyle  . Physical activity:    Days per week: Not on file    Minutes per session: Not on file  . Stress: Not on file  Relationships  . Social connections:    Talks on phone: Not on file    Gets together: Not on file    Attends religious service: Not on file    Active member of club or organization: Not on file    Attends meetings of clubs or organizations: Not on file    Relationship status: Not on file  . Intimate partner violence:  Fear of current or ex partner: Not on file    Emotionally abused: Not on file    Physically abused: Not on file    Forced sexual activity: Not on file  Other Topics Concern  . Not on file  Social History Narrative   Caffeine: rare   Lives alone, no pets. Grown son 81yo, grandson died suddenly at 1yo 2010 (drowning) - lots of stress from this.   Occupation: Tour manager, works third shift   Activity: no regular exercise but bought treadmill   Diet: good water, fruits daily, vegetables occasionally    Past Surgical History:  Procedure Laterality Date  . COLONOSCOPY  07/2011   hemorrhoids, rpt due 5 yrs given h/o polyps  . COLONOSCOPY WITH PROPOFOL N/A 05/29/2016   TA, rpt 5 yrs Kendra Bellows, MD)  . thyroid US  12/2011   1cm solid nodule lower R pole (stable), no significant nodule on left side, rec rpt [place] in 1 yr    . TVS  11/2008   multiple uterine fibroids    Family History  Problem Relation Age of Onset  . Heart disease Mother   . Deep vein thrombosis Mother   . Dementia Mother 81  . Heart disease Father        pig valve  . Coronary artery disease Father   . Cancer Father        prostate  . Cancer Maternal Grandmother        colon  . Stroke Maternal Grandfather   . Diabetes Sister   . Mental illness Sister        schizophrenia  . Breast cancer Sister 59  . Cancer Sister        breast  . Cancer Brother        appendix tumor, prostate  . Diabetes Brother   . Mental illness Brother        bipolar  . Cancer Maternal Aunt        bone  . Cancer Maternal Uncle        bladder  . Coronary artery disease Paternal Grandfather   . Cancer Cousin        ovarian, bone    Allergies  Allergen Reactions  . Neosporin [Neomycin-Bacitracin Zn-Polymyx] Rash    Current Outpatient Medications on File Prior to Visit  Medication Sig Dispense Refill  . aspirin 81 MG tablet Take 81 mg by mouth daily.    Marland Kitchen atenolol (TENORMIN) 50 MG tablet Take 1 tablet (50 mg total) by mouth 2 (two) times daily. 180 tablet 3  . diphenhydrAMINE (BENADRYL) 25 MG tablet Take 25 mg by mouth at bedtime as needed.    Marland Kitchen FLAXSEED, LINSEED, PO Take by mouth. Flaxseed powder    . Ginger, Zingiber officinalis, (GINGER ROOT) 550 MG CAPS Take 1 capsule by mouth daily.    . Misc Natural Products (APPLE CIDER VINEGAR) TABS Take by mouth daily.    . Multiple Vitamin (MULTIVITAMIN) tablet Take 1 tablet by mouth daily.    . Red Yeast Rice 600 MG CAPS Take 1 capsule (600 mg total) by mouth 2 (two) times daily.    . TURMERIC PO Take by mouth.    . valACYclovir (VALTREX) 500 MG tablet Take 1 tablet (500 mg total) by mouth 2 (two) times daily. For 3 days 30 tablet 3   No current facility-administered medications on file prior to visit.     BP 134/84   Pulse 78   Temp 98.2 F (36.8  C) (Oral)   Ht 5' 6.5" (1.689 m)   Wt 170 lb 12  oz (77.5 kg)   SpO2 98%   BMI 27.15 kg/m    Objective:   Physical Exam  Constitutional: She appears well-nourished. She does not appear ill.  HENT:  Right Ear: Tympanic membrane and ear canal normal.  Left Ear: Tympanic membrane and ear canal normal.  Nose: Mucosal edema present. Right sinus exhibits no maxillary sinus tenderness and no frontal sinus tenderness. Left sinus exhibits no maxillary sinus tenderness and no frontal sinus tenderness.  Mouth/Throat: Oropharynx is clear and moist.  Neck: Neck supple.  Cardiovascular: Normal rate and regular rhythm.  Respiratory: Effort normal and breath sounds normal. She has no wheezes.  Skin: Skin is warm and dry.           Assessment & Plan:  Allergic Rhinitis/URI:  Congestion, cough x 3 days.  Exam today overall unremarkable.  Suspect viral involvement vs allergies and will treat with supportive measures. Rx for Flonase and Tussionex sent to pharmacy.  Return precautions provided.  Pleas Koch, NP

## 2018-02-26 NOTE — Addendum Note (Signed)
Addended by: Jacqualin Combes on: 02/26/2018 10:08 AM   Modules accepted: Orders

## 2018-02-26 NOTE — Patient Instructions (Signed)
Your symptoms are representative of a viral illness which will resolve on its own over time. Our goal is to treat your symptoms in order to aid your body in the healing process and to make you more comfortable.   Nasal Congestion/Ear Pressure/Sinus Pressure: Try using Flonase (fluticasone) nasal spray. Instill 1 spray in each nostril twice daily.   You may take the Tussionex cough medication every 12 hours as needed for cough.  Make sure you are taking a daily antihistamine.   Please notify me if you develop persistent fevers of 101, notice increased fatigue or weakness, and/or feel worse after 1 week of onset of symptoms.   Increase consumption of water intake and rest.  It was a pleasure meeting you!

## 2018-02-27 ENCOUNTER — Other Ambulatory Visit: Payer: Self-pay | Admitting: Primary Care

## 2018-02-27 DIAGNOSIS — J069 Acute upper respiratory infection, unspecified: Secondary | ICD-10-CM

## 2018-04-01 NOTE — Progress Notes (Signed)
BP 120/80   Pulse 100   Temp 98.2 F (36.8 C) (Oral)   Ht 5' 6.5" (1.689 m)   Wt 172 lb 12 oz (78.4 kg)   SpO2 99%   BMI 27.46 kg/m    CC: check mole  Subjective:    Patient ID: Kendra Jackson, female    DOB: 10/16/1960, 58 y.o.   MRN: [id number]  HPI: Kendra Jackson is a 58 y.o. female presenting on 04/02/2018 for Nevus (Wants to have mole located on right cheek checked. Says mole is flat and denies any irritaition. )   Multiple deaths in family - suicide, COPD, new ovarian cancer dx in 2nd cousin.   Wants mole to R buttock checked. No pain, no itching. Initially more rough, now asxs.   No skin cancer in family.      Relevant past medical, surgical, family and social history reviewed and updated as indicated. Interim medical history since our last visit reviewed. Allergies and medications reviewed and updated. Outpatient Medications Prior to Visit  Medication Sig Dispense Refill  . aspirin 81 MG tablet Take 81 mg by mouth daily.    Marland Kitchen atenolol (TENORMIN) 50 MG tablet Take 1 tablet (50 mg total) by mouth 2 (two) times daily. 180 tablet 3  . chlorpheniramine-HYDROcodone (TUSSIONEX PENNKINETIC ER) 10-8 MG/5ML SUER Take 5 mLs by mouth every 12 (twelve) hours as needed for cough. 50 mL 0  . diphenhydrAMINE (BENADRYL) 25 MG tablet Take 25 mg by mouth at bedtime as needed.    Marland Kitchen FLAXSEED, LINSEED, PO Take by mouth. Flaxseed powder    . fluticasone (FLONASE) 50 MCG/ACT nasal spray Place 2 sprays into both nostrils daily. 16 g 0  . Ginger, Zingiber officinalis, (GINGER ROOT) 550 MG CAPS Take 1 capsule by mouth daily.    . Misc Natural Products (APPLE CIDER VINEGAR) TABS Take by mouth daily.    . Multiple Vitamin (MULTIVITAMIN) tablet Take 1 tablet by mouth daily.    . Red Yeast Rice 600 MG CAPS Take 1 capsule (600 mg total) by mouth 2 (two) times daily.    . TURMERIC PO Take by mouth.    . valACYclovir (VALTREX) 500 MG tablet Take 1 tablet (500 mg total) by mouth 2 (two) times daily.  For 3 days 30 tablet 3   No facility-administered medications prior to visit.      Per HPI unless specifically indicated in ROS section below Review of Systems Objective:    BP 120/80   Pulse 100   Temp 98.2 F (36.8 C) (Oral)   Ht 5' 6.5" (1.689 m)   Wt 172 lb 12 oz (78.4 kg)   SpO2 99%   BMI 27.46 kg/m   Wt Readings from Last 3 Encounters:  04/02/18 172 lb 12 oz (78.4 kg)  02/26/18 170 lb 12 oz (77.5 kg)  01/09/18 172 lb (78 kg)    Physical Exam Vitals signs and nursing note reviewed.  Constitutional:      General: She is not in acute distress.    Appearance: Normal appearance.  Skin:    General: Skin is warm and dry.     Findings: Rash present. No erythema.     Comments: Slightly scaly hyperpigmented annular macule R inner buttock  Neurological:     Mental Status: She is alert.        Assessment & Plan:   Problem List Items Addressed This Visit    Skin rash - Primary    ?ringworm vs benign nevus vs  SK. rec lotrimin x 1-2 wks BID and continue moisturizing lotion. Update if enlarging or change.           No orders of the defined types were placed in this encounter.  No orders of the defined types were placed in this encounter.  Patient Instructions  Possible ringworm or possible benign mole - continue lotion, but add clotrimazole cream twice daily for 2 weeks to see if fully resolves.     Follow up plan: No follow-ups on file.  Ria Bush, MD

## 2018-04-02 ENCOUNTER — Encounter: Payer: Self-pay | Admitting: Family Medicine

## 2018-04-02 ENCOUNTER — Ambulatory Visit: Payer: Federal, State, Local not specified - PPO | Admitting: Family Medicine

## 2018-04-02 VITALS — BP 120/80 | HR 100 | Temp 98.2°F | Ht 66.5 in | Wt 172.8 lb

## 2018-04-02 DIAGNOSIS — R21 Rash and other nonspecific skin eruption: Secondary | ICD-10-CM | POA: Diagnosis not present

## 2018-04-02 NOTE — Assessment & Plan Note (Signed)
?  ringworm vs benign nevus vs SK. rec lotrimin x 1-2 wks BID and continue moisturizing lotion. Update if enlarging or change.

## 2018-04-02 NOTE — Patient Instructions (Signed)
Possible ringworm or possible benign mole - continue lotion, but add clotrimazole cream twice daily for 2 weeks to see if fully resolves.

## 2018-05-12 ENCOUNTER — Ambulatory Visit: Payer: Federal, State, Local not specified - PPO | Admitting: Family Medicine

## 2018-05-19 ENCOUNTER — Ambulatory Visit (INDEPENDENT_AMBULATORY_CARE_PROVIDER_SITE_OTHER): Payer: Federal, State, Local not specified - PPO | Admitting: Family Medicine

## 2018-05-19 ENCOUNTER — Encounter: Payer: Self-pay | Admitting: Family Medicine

## 2018-05-19 VITALS — BP 136/94 | HR 76 | Temp 97.4°F | Resp 14 | Ht 66.5 in | Wt 173.2 lb

## 2018-05-19 DIAGNOSIS — E0590 Thyrotoxicosis, unspecified without thyrotoxic crisis or storm: Secondary | ICD-10-CM

## 2018-05-19 DIAGNOSIS — I10 Essential (primary) hypertension: Secondary | ICD-10-CM | POA: Diagnosis not present

## 2018-05-19 DIAGNOSIS — E785 Hyperlipidemia, unspecified: Secondary | ICD-10-CM

## 2018-05-19 LAB — LIPID PANEL
CHOL/HDL RATIO: 3
CHOLESTEROL: 231 mg/dL — AB (ref 0–200)
HDL: 83.9 mg/dL (ref >39.00)
LDL CALC: 135 mg/dL — AB (ref 0–99)
NonHDL: 147.45
TRIGLYCERIDES: 63 mg/dL (ref 0.0–149.0)
VLDL: 12.6 mg/dL (ref 0.0–40.0)

## 2018-05-19 LAB — TSH: TSH: 0.26 u[IU]/mL — ABNORMAL LOW (ref 0.35–4.50)

## 2018-05-19 LAB — T4, FREE: FREE T4: 0.83 ng/dL (ref 0.60–1.60)

## 2018-05-19 NOTE — Assessment & Plan Note (Signed)
Update TFTs per pt request. She sees endo Q2 yrs.

## 2018-05-19 NOTE — Assessment & Plan Note (Addendum)
Chronic, remains above goal however pt not currently ready to add 2nd BP med. Will work towards limiting salt intake, start monitoring BP more consistently. We also discussed closer adherence to more regular scheduled antihypertensive administration times.  RTC 4 mo f/u visit BP recheck.

## 2018-05-19 NOTE — Progress Notes (Signed)
BP (!) 136/94   Pulse 76   Temp (!) 97.4 F (36.3 C)   Resp 14   Ht 5' 6.5" (1.689 m)   Wt 173 lb 4 oz (78.6 kg)   SpO2 100%   BMI 27.54 kg/m   On repeat, 140/100  CC: 4 mo f/u visit Subjective:    Patient ID: Kendra Jackson, female    DOB: July 13, 1960, 58 y.o.   MRN: [id number]  HPI: Kendra Jackson is a 59 y.o. female presenting on 05/19/2018 for Hypertension (4 months follow up) and Hyperlipidemia (4 months follow up)   See prior note for details.  HTN - bp mildly elevated despite atenolol 50mg  bid. She has not been checking BP at home. Denies HA, vision changes, CP/tightness, SOB, leg swelling.   HLD - last visit we recommended statin. She preferred to take RYR until ran out - she has doubled up on red yeast rice and would like lipids checked prior to starting statin.      Relevant past medical, surgical, family and social history reviewed and updated as indicated. Interim medical history since our last visit reviewed. Allergies and medications reviewed and updated. Outpatient Medications Prior to Visit  Medication Sig Dispense Refill  . aspirin 81 MG tablet Take 81 mg by mouth daily.    Marland Kitchen atenolol (TENORMIN) 50 MG tablet Take 1 tablet (50 mg total) by mouth 2 (two) times daily. 180 tablet 3  . diphenhydrAMINE (BENADRYL) 25 MG tablet Take 25 mg by mouth at bedtime as needed.    Marland Kitchen ELDERBERRY PO Take by mouth.    Marland Kitchen FLAXSEED, LINSEED, PO Take by mouth. Flaxseed powder    . fluticasone (FLONASE) 50 MCG/ACT nasal spray Place 2 sprays into both nostrils daily. 16 g 0  . Ginger, Zingiber officinalis, (GINGER ROOT) 550 MG CAPS Take 1 capsule by mouth daily.    . magnesium 30 MG tablet Take 30 mg by mouth 2 (two) times daily.    . Misc Natural Products (APPLE CIDER VINEGAR) TABS Take by mouth daily.    . Multiple Vitamin (MULTIVITAMIN) tablet Take 1 tablet by mouth daily.    . Red Yeast Rice 600 MG CAPS Take 1 capsule (600 mg total) by mouth 2 (two) times daily.    . TURMERIC PO  Take by mouth.    . valACYclovir (VALTREX) 500 MG tablet Take 1 tablet (500 mg total) by mouth 2 (two) times daily. For 3 days 30 tablet 3  . chlorpheniramine-HYDROcodone (TUSSIONEX PENNKINETIC ER) 10-8 MG/5ML SUER Take 5 mLs by mouth every 12 (twelve) hours as needed for cough. 50 mL 0   No facility-administered medications prior to visit.      Per HPI unless specifically indicated in ROS section below Review of Systems Objective:    BP (!) 136/94   Pulse 76   Temp (!) 97.4 F (36.3 C)   Resp 14   Ht 5' 6.5" (1.689 m)   Wt 173 lb 4 oz (78.6 kg)   SpO2 100%   BMI 27.54 kg/m   Wt Readings from Last 3 Encounters:  05/19/18 173 lb 4 oz (78.6 kg)  04/02/18 172 lb 12 oz (78.4 kg)  02/26/18 170 lb 12 oz (77.5 kg)    Physical Exam Vitals signs and nursing note reviewed.  Constitutional:      Appearance: Normal appearance. She is not ill-appearing.  HENT:     Head: Normocephalic and atraumatic.     Mouth/Throat:     Mouth: Mucous  membranes are moist.  Eyes:     Extraocular Movements: Extraocular movements intact.     Conjunctiva/sclera: Conjunctivae normal.     Pupils: Pupils are equal, round, and reactive to light.  Neck:     Thyroid: No thyroid mass, thyromegaly or thyroid tenderness.  Cardiovascular:     Rate and Rhythm: Normal rate and regular rhythm.     Pulses: Normal pulses.     Heart sounds: Normal heart sounds. No murmur.  Pulmonary:     Effort: Pulmonary effort is normal. No respiratory distress.     Breath sounds: Normal breath sounds. No wheezing, rhonchi or rales.  Musculoskeletal:     Right lower leg: No edema.     Left lower leg: No edema.  Neurological:     Mental Status: She is alert.       Results for orders placed or performed in visit on 02/10/18  [facility] MAMMOGRAPHY  Result Value Ref Range   HM Mammogram 0-4 Bi-Rad 0-4 Bi-Rad, Self Reported Normal   Lab Results  Component Value Date   CHOL 253 (H) 01/06/2018   HDL 84.80 01/06/2018   LDLCALC 158  (H) 01/06/2018   LDLDIRECT 143.7 12/10/2012   TRIG 49.0 01/06/2018   CHOLHDL 3 01/06/2018    Assessment & Plan:   Problem List Items Addressed This Visit    Subclinical hyperthyroidism    Update TFTs per pt request. She sees endo Q2 yrs.      Relevant Orders   TSH   T4, free   HTN (hypertension) - Primary    Chronic, remains above goal however pt not currently ready to add 2nd BP med. Will work towards limiting salt intake, start monitoring BP more consistently. We also discussed closer adherence to more regular scheduled antihypertensive administration times.  RTC 4 mo f/u visit BP recheck.       HLD (hyperlipidemia)    Chronic, update lipid panel with regularly BID RYR use. Discussed statin if no significant improvement noted.       Relevant Orders   Lipid panel       No orders of the defined types were placed in this encounter.  Orders Placed This Encounter  Procedures  . Lipid panel  . TSH  . T4, free    Follow up plan: Return in about 4 months (around 09/17/2018) for follow up visit.  Ria Bush, MD

## 2018-05-19 NOTE — Patient Instructions (Addendum)
Good to see you today! Labs today. We will call you with results.  Try to be more consistent with atenolol.  Start checking blood pressures a few times a month at local pharmacy - let me know readings especially if consistently >140/90.  Limit salt in the diet.  Work on aerobic exercise.

## 2018-05-19 NOTE — Assessment & Plan Note (Signed)
Chronic, update lipid panel with regularly BID RYR use. Discussed statin if no significant improvement noted.

## 2018-06-13 ENCOUNTER — Telehealth: Payer: Self-pay

## 2018-06-13 DIAGNOSIS — J069 Acute upper respiratory infection, unspecified: Secondary | ICD-10-CM

## 2018-06-13 NOTE — Telephone Encounter (Signed)
Received refill request from [facility] for refill on Tussionex for patient. This was filled by Alma Friendly in December 2019. Patient states she is continuing to have chronic cough and psot nasal drainage. She states she discussed this with Dr. Darnell Level on her last visit and thinks maybe she needs to be referred to ENT also. She constantly spitting up mucus. Did notice some yellow phlgem this morning when she was coughing. She has also had wheezing off and on. Her symptoms never resolved since December acute visit. She did not take Tussionex on regular bases at that time and thinks maybe she should have. She had some left over and took that this morning and it helped some. No body aches. No fever. No SOB. I asked patient about traveling but the phone call got cut off. Called back no answer. Does the patient need to be scheduled for telephone call for Monday or able to help her without that? Please review

## 2018-06-13 NOTE — Telephone Encounter (Signed)
Spoke with Dr. Darnell Level and patient. Patient did not feel like telephone visit would be beneficial. Patient states she is not a doctor but feels like she needs to see a specialist for her chronic drainage and cough. She feels like it could be acid reflux. She will see whoever Dr. Darnell Level thinks she should see for this. She is tired of drainage and coughing so much. Advised patient I would let Dr. Darnell Level know and we will call back with suggestions.

## 2018-06-16 MED ORDER — HYDROCOD POLST-CPM POLST ER 10-8 MG/5ML PO SUER: 5.0000 mL | Freq: Two times a day (BID) | ORAL | 0 refills | Status: DC | PRN

## 2018-06-16 MED ORDER — OMEPRAZOLE 20 MG PO CPDR: 20.0000 mg | DELAYED_RELEASE_CAPSULE | Freq: Every day | ORAL | 1 refills | Status: DC

## 2018-06-16 NOTE — Addendum Note (Signed)
Addended by: Ria Bush on: 06/16/2018 01:14 PM   Modules accepted: Orders

## 2018-06-16 NOTE — Telephone Encounter (Addendum)
Don't think she needs specialist referral at this time.  This could be allergic (with spring pollen) or agree it could be reflux. She is currently prescribed flonase - has she tried this and if so what effect? We can try trial of heartburn medication for 2 weeks to see if any benefit - I've sent to pharmacy but it may be OTC. Update me after trial of omeprazole 20mg  daily.  I have also refilled tussionex to use PRN night time cough.

## 2018-06-16 NOTE — Telephone Encounter (Signed)
Noted  

## 2018-06-16 NOTE — Telephone Encounter (Signed)
Spoke with pt relaying Dr. Synthia Innocent message and instructions.  Pt verbalizes understanding.  Says she tried either Afrin or Flonase and it made her nose burn so she does not want to use those. However, she is using saline rinse and will try meds sent to the pharmacy. Fyi to Dr. Darnell Level.

## 2018-07-09 ENCOUNTER — Other Ambulatory Visit: Payer: Self-pay | Admitting: Family Medicine

## 2018-08-09 ENCOUNTER — Other Ambulatory Visit: Payer: Self-pay | Admitting: Family Medicine

## 2018-09-04 ENCOUNTER — Other Ambulatory Visit: Payer: Self-pay | Admitting: Family Medicine

## 2018-09-15 ENCOUNTER — Ambulatory Visit: Payer: Federal, State, Local not specified - PPO | Admitting: Family Medicine

## 2018-09-15 ENCOUNTER — Other Ambulatory Visit: Payer: Self-pay

## 2018-09-15 ENCOUNTER — Encounter: Payer: Self-pay | Admitting: Family Medicine

## 2018-09-15 VITALS — BP 144/86 | HR 88 | Temp 98.1°F | Wt 177.4 lb

## 2018-09-15 DIAGNOSIS — E0590 Thyrotoxicosis, unspecified without thyrotoxic crisis or storm: Secondary | ICD-10-CM

## 2018-09-15 DIAGNOSIS — I10 Essential (primary) hypertension: Secondary | ICD-10-CM

## 2018-09-15 DIAGNOSIS — K2100 Gastro-esophageal reflux disease with esophagitis, without bleeding: Secondary | ICD-10-CM

## 2018-09-15 DIAGNOSIS — E042 Nontoxic multinodular goiter: Secondary | ICD-10-CM | POA: Diagnosis not present

## 2018-09-15 DIAGNOSIS — E785 Hyperlipidemia, unspecified: Secondary | ICD-10-CM

## 2018-09-15 LAB — TSH: TSH: 0.44 u[IU]/mL (ref 0.35–4.50)

## 2018-09-15 LAB — T4, FREE: Free T4: 0.87 ng/dL (ref 0.60–1.60)

## 2018-09-15 LAB — LDL CHOLESTEROL, DIRECT: Direct LDL: 136 mg/dL

## 2018-09-15 NOTE — Assessment & Plan Note (Signed)
Stable on omeprazole 20mg daily.  

## 2018-09-15 NOTE — Patient Instructions (Addendum)
Labs today Blood pressure staying a bit high - watch salt/sodium in diet.  Goal sodium 2000mg /day, ideally >1500mg /day.  Good to see you today, call us with questions. Return in 4 months for physical

## 2018-09-15 NOTE — Progress Notes (Signed)
This visit was conducted in person.  BP (!) 144/86 (BP Location: Right Arm, Cuff Size: Normal)   Pulse 88   Temp 98.1 F (36.7 C) (Temporal)   Wt 177 lb 7 oz (80.5 kg)   SpO2 96%   BMI 28.21 kg/m   BP Readings from Last 3 Encounters:  09/15/18 (!) 144/86  05/19/18 (!) 136/94  04/02/18 120/80    CC: HTN f/u visit Subjective:    Patient ID: Kendra Jackson, female    DOB: [birth year], 58 y.o.   MRN: [id number]  HPI: Shelena Castelluccio is a 59 y.o. female presenting on 09/15/2018 for Follow-up (4 month HTN f/u)   HTN - Compliant with current antihypertensive regimen of atenolol 50mg  bid. Does not check blood pressures at home:. No low blood pressure readings or symptoms of dizziness/syncope. Denies HA, vision changes, CP/tightness, SOB, leg swelling. She attributes elevated BP today to OTC allergy med she's been taking.   GERD - mucous in throat has significantly improved on omeprazole 20mg  daily.   Borderline thyroid disease - endorses heat intolerance. No tremors, palpitations.      Relevant past medical, surgical, family and social history reviewed and updated as indicated. Interim medical history since our last visit reviewed. Allergies and medications reviewed and updated. Outpatient Medications Prior to Visit  Medication Sig Dispense Refill  . aspirin 81 MG tablet Take 81 mg by mouth daily.    Marland Kitchen atenolol (TENORMIN) 50 MG tablet Take 1 tablet (50 mg total) by mouth 2 (two) times daily. 180 tablet 3  . diphenhydrAMINE (BENADRYL) 25 MG tablet Take 25 mg by mouth at bedtime as needed.    Marland Kitchen ELDERBERRY PO Take by mouth.    Marland Kitchen FLAXSEED, LINSEED, PO Take by mouth. Flaxseed powder    . fluticasone (FLONASE) 50 MCG/ACT nasal spray Place 2 sprays into both nostrils daily. 16 g 0  . Ginger, Zingiber officinalis, (GINGER ROOT) 550 MG CAPS Take 1 capsule by mouth daily.    . magnesium 30 MG tablet Take 30 mg by mouth 2 (two) times daily.    . Misc Natural Products (APPLE CIDER VINEGAR) TABS  Take by mouth daily.    . Multiple Vitamin (MULTIVITAMIN) tablet Take 1 tablet by mouth daily.    Marland Kitchen omeprazole (PRILOSEC) 20 MG capsule TAKE 1 CAPSULE BY MOUTH EVERY DAY 30 capsule 5  . Red Yeast Rice 600 MG CAPS Take 1 capsule (600 mg total) by mouth 2 (two) times daily.    . TURMERIC PO Take by mouth.    . valACYclovir (VALTREX) 500 MG tablet Take 1 tablet (500 mg total) by mouth 2 (two) times daily. For 3 days 30 tablet 3  . chlorpheniramine-HYDROcodone (TUSSIONEX PENNKINETIC ER) 10-8 MG/5ML SUER Take 5 mLs by mouth every 12 (twelve) hours as needed for cough. 75 mL 0   No facility-administered medications prior to visit.      Per HPI unless specifically indicated in ROS section below Review of Systems Objective:    BP (!) 144/86 (BP Location: Right Arm, Cuff Size: Normal)   Pulse 88   Temp 98.1 F (36.7 C) (Temporal)   Wt 177 lb 7 oz (80.5 kg)   SpO2 96%   BMI 28.21 kg/m   Wt Readings from Last 3 Encounters:  09/15/18 177 lb 7 oz (80.5 kg)  05/19/18 173 lb 4 oz (78.6 kg)  04/02/18 172 lb 12 oz (78.4 kg)    Physical Exam Vitals signs and nursing note reviewed.  Constitutional:  General: She is not in acute distress.    Appearance: Normal appearance. She is well-developed. She is not ill-appearing.  HENT:     Head: Normocephalic and atraumatic.     Mouth/Throat:     Mouth: Mucous membranes are moist.     Pharynx: No posterior oropharyngeal erythema.  Eyes:     General: No scleral icterus.    Extraocular Movements: Extraocular movements intact.     Conjunctiva/sclera: Conjunctivae normal.     Pupils: Pupils are equal, round, and reactive to light.  Neck:     Musculoskeletal: Normal range of motion and neck supple.  Cardiovascular:     Rate and Rhythm: Normal rate and regular rhythm.     Pulses: Normal pulses.     Heart sounds: Normal heart sounds. No murmur.  Pulmonary:     Effort: Pulmonary effort is normal. No respiratory distress.     Breath sounds: Normal  breath sounds. No wheezing, rhonchi or rales.  Musculoskeletal:     Right lower leg: No edema.     Left lower leg: No edema.  Lymphadenopathy:     Cervical: No cervical adenopathy.  Skin:    General: Skin is warm and dry.     Findings: No rash.  Neurological:     Mental Status: She is alert.  Psychiatric:        Mood and Affect: Mood normal.       Results for orders placed or performed in visit on 05/19/18  Lipid panel  Result Value Ref Range   Cholesterol 231 (H) 0 - 200 mg/dL   Triglycerides 63.0 0.0 - 149.0 mg/dL   HDL 83.90 >39.00 mg/dL   VLDL 12.6 0.0 - 40.0 mg/dL   LDL Cholesterol 135 (H) 0 - 99 mg/dL   Total CHOL/HDL Ratio 3    NonHDL 147.45   TSH  Result Value Ref Range   TSH 0.26 (L) 0.35 - 4.50 uIU/mL  T4, free  Result Value Ref Range   Free T4 0.83 0.60 - 1.60 ng/dL   Assessment & Plan:   Problem List Items Addressed This Visit    Subclinical hyperthyroidism    Update TFTs today given endorsed heat intolerance in h/o subclinical hyperthyroidism. Seeing endo Q2 yrs      Relevant Orders   TSH   T4, free   T3   Reflux esophagitis    Stable on omeprazole 20mg  daily.      Multinodular goiter   HTN (hypertension) - Primary    Chronic, a bit above goal. Discussed limiting sodium in diet to <2gm, ideally <1.5gm. No med changes today.       HLD (hyperlipidemia)    Update dLDL      Relevant Orders   LDL Cholesterol, Direct       No orders of the defined types were placed in this encounter.  Orders Placed This Encounter  Procedures  . TSH  . T4, free  . T3  . LDL Cholesterol, Direct    Follow up plan: Return in about 4 months (around 01/15/2019) for annual exam, prior fasting for blood work.  Ria Bush, MD

## 2018-09-15 NOTE — Assessment & Plan Note (Signed)
Update dLDL 

## 2018-09-15 NOTE — Assessment & Plan Note (Signed)
Chronic, a bit above goal. Discussed limiting sodium in diet to <2gm, ideally <1.5gm. No med changes today.

## 2018-09-15 NOTE — Assessment & Plan Note (Signed)
Update TFTs today given endorsed heat intolerance in h/o subclinical hyperthyroidism. Seeing endo Q2 yrs

## 2018-09-16 LAB — T3: T3, Total: 108 ng/dL (ref 76–181)

## 2018-12-02 ENCOUNTER — Other Ambulatory Visit: Payer: Self-pay

## 2018-12-02 ENCOUNTER — Ambulatory Visit: Payer: Federal, State, Local not specified - PPO | Admitting: Family Medicine

## 2018-12-02 ENCOUNTER — Encounter: Payer: Self-pay | Admitting: Family Medicine

## 2018-12-02 VITALS — BP 126/78 | HR 75 | Temp 98.0°F | Ht 66.5 in | Wt 176.1 lb

## 2018-12-02 DIAGNOSIS — R2241 Localized swelling, mass and lump, right lower limb: Secondary | ICD-10-CM

## 2018-12-02 DIAGNOSIS — Z23 Encounter for immunization: Secondary | ICD-10-CM | POA: Diagnosis not present

## 2018-12-02 DIAGNOSIS — Z8619 Personal history of other infectious and parasitic diseases: Secondary | ICD-10-CM | POA: Diagnosis not present

## 2018-12-02 MED ORDER — VALACYCLOVIR HCL 500 MG PO TABS: 500.0000 mg | ORAL_TABLET | Freq: Two times a day (BID) | ORAL | 3 refills | Status: DC

## 2018-12-02 NOTE — Assessment & Plan Note (Signed)
Anticipate tendon or ganglion cyst after possible trauma. Pt endorses this is improving, decreasing in size. Reassurance provided, supportive care recommended.

## 2018-12-02 NOTE — Progress Notes (Signed)
This visit was conducted in person.  BP 126/78 (BP Location: Left Arm, Patient Position: Sitting, Cuff Size: Normal)   Pulse 75   Temp 98 F (36.7 C) (Temporal)   Ht 5' 6.5" (1.689 m)   Wt 176 lb 1 oz (79.9 kg)   SpO2 98%   BMI 27.99 kg/m    CC: check foot Subjective:    Patient ID: Kendra Jackson, female    DOB: 1960/05/27, 58 y.o.   MRN: [id number]  HPI: Kendra Jackson is a 58 y.o. female presenting on 12/02/2018 for Foot Injury (C/o bump on anterior right foot.  Thinks she may have dropped something on the foot.  Noticed about 1 wk ago.  Denies any pain/swelling.  Seems to have improved )   1 wk h/o bump on R foot - may have started after dropping something on the foot but doesn't remember details. Never painful or red or warm. Knot is slowly improving. Didn't treat with med or ice/heat. Never broke skin.   Requests valtrex refilled - attributes to increased stress.      Relevant past medical, surgical, family and social history reviewed and updated as indicated. Interim medical history since our last visit reviewed. Allergies and medications reviewed and updated. Outpatient Medications Prior to Visit  Medication Sig Dispense Refill  . aspirin 81 MG tablet Take 81 mg by mouth daily.    Marland Kitchen atenolol (TENORMIN) 50 MG tablet Take 1 tablet (50 mg total) by mouth 2 (two) times daily. 180 tablet 3  . diphenhydrAMINE (BENADRYL) 25 MG tablet Take 25 mg by mouth at bedtime as needed.    Marland Kitchen ELDERBERRY PO Take by mouth.    Marland Kitchen FLAXSEED, LINSEED, PO Take by mouth. Flaxseed powder    . fluticasone (FLONASE) 50 MCG/ACT nasal spray Place 2 sprays into both nostrils daily. 16 g 0  . Ginger, Zingiber officinalis, (GINGER ROOT) 550 MG CAPS Take 1 capsule by mouth daily.    . magnesium 30 MG tablet Take 30 mg by mouth 2 (two) times daily.    . Misc Natural Products (APPLE CIDER VINEGAR) TABS Take by mouth daily.    . Multiple Vitamin (MULTIVITAMIN) tablet Take 1 tablet by mouth daily.    Marland Kitchen  omeprazole (PRILOSEC) 20 MG capsule TAKE 1 CAPSULE BY MOUTH EVERY DAY 30 capsule 5  . Red Yeast Rice 600 MG CAPS Take 1 capsule (600 mg total) by mouth 2 (two) times daily.    . TURMERIC PO Take by mouth.    . valACYclovir (VALTREX) 500 MG tablet Take 1 tablet (500 mg total) by mouth 2 (two) times daily. For 3 days 30 tablet 3   No facility-administered medications prior to visit.      Per HPI unless specifically indicated in ROS section below Review of Systems Objective:    BP 126/78 (BP Location: Left Arm, Patient Position: Sitting, Cuff Size: Normal)   Pulse 75   Temp 98 F (36.7 C) (Temporal)   Ht 5' 6.5" (1.689 m)   Wt 176 lb 1 oz (79.9 kg)   SpO2 98%   BMI 27.99 kg/m   Wt Readings from Last 3 Encounters:  12/02/18 176 lb 1 oz (79.9 kg)  09/15/18 177 lb 7 oz (80.5 kg)  05/19/18 173 lb 4 oz (78.6 kg)    Physical Exam Vitals signs and nursing note reviewed.  Constitutional:      Appearance: Normal appearance. She is not ill-appearing.  Musculoskeletal:     Right lower leg: No  edema.     Left lower leg: No edema.     Comments:  2+ DP bilaterally L foot WNL R foot - firm tender lump along 2nd toe extensor tendon mid MT. No redness or warmth.   Neurological:     Mental Status: She is alert.       Assessment & Plan:  She will check with insurance on shingrix coverage.  Problem List Items Addressed This Visit    Mass of foot, right - Primary    Anticipate tendon or ganglion cyst after possible trauma. Pt endorses this is improving, decreasing in size. Reassurance provided, supportive care recommended.       History of herpes genitalis    Valtrex refilled.        Other Visit Diagnoses    Need for influenza vaccination       Relevant Orders   Flu Vaccine QUAD 36+ mos IM (Completed)       Meds ordered this encounter  Medications  . valACYclovir (VALTREX) 500 MG tablet    Sig: Take 1 tablet (500 mg total) by mouth 2 (two) times daily. For 3 days    Dispense:   30 tablet    Refill:  3   Orders Placed This Encounter  Procedures  . Flu Vaccine QUAD 36+ mos IM    Follow up plan: Return if symptoms worsen or fail to improve.  Ria Bush, MD

## 2018-12-02 NOTE — Patient Instructions (Addendum)
Check with insurance about cost for new shingles shot (shingrix). If you'd like you can receive here - nurse visit.  I think you have tendon or ganglion cyst of foot. Should continue to improve over time. Try warm compresses to area. Let us know if enlarging or more painful.  Valtrex refilled.

## 2018-12-02 NOTE — Assessment & Plan Note (Signed)
Valtrex refilled.

## 2018-12-09 ENCOUNTER — Other Ambulatory Visit: Payer: Self-pay | Admitting: Family Medicine

## 2018-12-31 ENCOUNTER — Other Ambulatory Visit: Payer: Self-pay | Admitting: Family Medicine

## 2019-01-07 ENCOUNTER — Other Ambulatory Visit: Payer: Self-pay | Admitting: Family Medicine

## 2019-01-07 DIAGNOSIS — E0590 Thyrotoxicosis, unspecified without thyrotoxic crisis or storm: Secondary | ICD-10-CM

## 2019-01-07 DIAGNOSIS — E785 Hyperlipidemia, unspecified: Secondary | ICD-10-CM

## 2019-01-07 DIAGNOSIS — I10 Essential (primary) hypertension: Secondary | ICD-10-CM

## 2019-01-08 ENCOUNTER — Other Ambulatory Visit (INDEPENDENT_AMBULATORY_CARE_PROVIDER_SITE_OTHER): Payer: Federal, State, Local not specified - PPO

## 2019-01-08 DIAGNOSIS — I10 Essential (primary) hypertension: Secondary | ICD-10-CM | POA: Diagnosis not present

## 2019-01-08 DIAGNOSIS — E785 Hyperlipidemia, unspecified: Secondary | ICD-10-CM

## 2019-01-08 LAB — LIPID PANEL
Cholesterol: 261 mg/dL — ABNORMAL HIGH (ref 0–200)
HDL: 73.9 mg/dL (ref >39.00)
LDL Cholesterol: 162 mg/dL — ABNORMAL HIGH (ref 0–99)
NonHDL: 187.28
Total CHOL/HDL Ratio: 4
Triglycerides: 124 mg/dL (ref 0.0–149.0)
VLDL: 24.8 mg/dL (ref 0.0–40.0)

## 2019-01-08 LAB — COMPREHENSIVE METABOLIC PANEL
ALT: 15 U/L (ref 0–35)
AST: 17 U/L (ref 0–37)
Albumin: 4.5 g/dL (ref 3.5–5.2)
Alkaline Phosphatase: 94 U/L (ref 39–117)
BUN: 13 mg/dL (ref 6–23)
CO2: 30 mEq/L (ref 19–32)
Calcium: 9.2 mg/dL (ref 8.4–10.5)
Chloride: 102 mEq/L (ref 96–112)
Creatinine, Ser: 0.85 mg/dL (ref 0.40–1.20)
GFR: 82.5 mL/min (ref >60.00)
Glucose, Bld: 112 mg/dL — ABNORMAL HIGH (ref 70–99)
Potassium: 3.9 mEq/L (ref 3.5–5.1)
Sodium: 138 mEq/L (ref 135–145)
Total Bilirubin: 0.4 mg/dL (ref 0.2–1.2)
Total Protein: 7.9 g/dL (ref 6.0–8.3)

## 2019-01-08 LAB — MICROALBUMIN / CREATININE URINE RATIO
Creatinine,U: 90.4 mg/dL
Microalb Creat Ratio: 3.5 mg/g (ref 0.0–30.0)
Microalb, Ur: 3.2 mg/dL — ABNORMAL HIGH (ref 0.0–1.9)

## 2019-01-12 ENCOUNTER — Other Ambulatory Visit: Payer: Self-pay

## 2019-01-12 ENCOUNTER — Ambulatory Visit (INDEPENDENT_AMBULATORY_CARE_PROVIDER_SITE_OTHER): Payer: Federal, State, Local not specified - PPO | Admitting: Family Medicine

## 2019-01-12 ENCOUNTER — Encounter: Payer: Self-pay | Admitting: Family Medicine

## 2019-01-12 VITALS — BP 134/82 | HR 73 | Temp 97.6°F | Ht 66.25 in | Wt 180.3 lb

## 2019-01-12 DIAGNOSIS — E785 Hyperlipidemia, unspecified: Secondary | ICD-10-CM | POA: Diagnosis not present

## 2019-01-12 DIAGNOSIS — E0590 Thyrotoxicosis, unspecified without thyrotoxic crisis or storm: Secondary | ICD-10-CM

## 2019-01-12 DIAGNOSIS — Z23 Encounter for immunization: Secondary | ICD-10-CM

## 2019-01-12 DIAGNOSIS — N644 Mastodynia: Secondary | ICD-10-CM

## 2019-01-12 DIAGNOSIS — Z0000 Encounter for general adult medical examination without abnormal findings: Secondary | ICD-10-CM

## 2019-01-12 DIAGNOSIS — I10 Essential (primary) hypertension: Secondary | ICD-10-CM

## 2019-01-12 DIAGNOSIS — E042 Nontoxic multinodular goiter: Secondary | ICD-10-CM

## 2019-01-12 NOTE — Assessment & Plan Note (Signed)
Chronic. Compliant with RYR BID. Continue TLC with RYR for 6 mo then recheck.  The 10-year ASCVD risk score [name] [name]., et al., 2013) is: 8.3%   Values used to calculate the score:     Age: 58 years     Sex: Female     Is Non-Hispanic African American: Yes     Diabetic: No     Tobacco smoker: No     Systolic Blood Pressure: [id number] mmHg     Is BP treated: Yes     HDL Cholesterol: 73.9 mg/dL     Total Cholesterol: 261 mg/dL

## 2019-01-12 NOTE — Assessment & Plan Note (Signed)
Chronic, stable. Continue current regimen. 

## 2019-01-12 NOTE — Assessment & Plan Note (Signed)
Recurrent and ongoing diffuse L breast pain, with normal dx mammo/US 2018 with mildly complex 43mm cyst noted. Given ongoing pain, will rpt dx mammo/US this year. Pt agrees with plan. No R breast pain.

## 2019-01-12 NOTE — Patient Instructions (Addendum)
First shingrix shot today. Schedule nurse visit in 2-6 months for second shingrix shot.  Work on [employer] and lifestyle to help lower cholesterol. Continue red yeast rice twice daily. Return in 6 months for labs to recheck cholesterol.  Good to see you today, call us with questions. Return in 6 months for follow up visit.   Health Maintenance for Postmenopausal Women Menopause is a normal process in which your ability to get pregnant comes to an end. This process happens slowly over many months or years, usually between the ages of 30 and 17. Menopause is complete when you have missed your menstrual periods for 12 months. It is important to talk with your health care provider about some of the most common conditions that affect women after menopause (postmenopausal women). These include heart disease, cancer, and bone loss (osteoporosis). Adopting a healthy lifestyle and getting preventive care can help to promote your health and wellness. The actions you take can also lower your chances of developing some of these common conditions. What should I know about menopause? During menopause, you may get a number of symptoms, such as:  Hot flashes. These can be moderate or severe.  Night sweats.  Decrease in sex drive.  Mood swings.  Headaches.  Tiredness.  Irritability.  Memory problems.  Insomnia. Choosing to treat or not to treat these symptoms is a decision that you make with your health care provider. Do I need hormone replacement therapy?  Hormone replacement therapy is effective in treating symptoms that are caused by menopause, such as hot flashes and night sweats.  Hormone replacement carries certain risks, especially as you become older. If you are thinking about using estrogen or estrogen with progestin, discuss the benefits and risks with your health care provider. What is my risk for heart disease and stroke? The risk of heart disease, heart attack, and stroke increases  as you age. One of the causes may be a change in the body's hormones during menopause. This can affect how your body uses dietary fats, triglycerides, and cholesterol. Heart attack and stroke are medical emergencies. There are many things that you can do to help prevent heart disease and stroke. Watch your blood pressure  High blood pressure causes heart disease and increases the risk of stroke. This is more likely to develop in people who have high blood pressure readings, are of African descent, or are overweight.  Have your blood pressure checked: ? Every 3-5 years if you are 45-83 years of age. ? Every year if you are 38 years old or older. Eat a healthy diet   Eat a diet that includes plenty of vegetables, fruits, low-fat dairy products, and lean protein.  Do not eat a lot of foods that are high in solid fats, added sugars, or sodium. Get regular exercise Get regular exercise. This is one of the most important things you can do for your health. Most adults should:  Try to exercise for at least 150 minutes each week. The exercise should increase your heart rate and make you sweat (moderate-intensity exercise).  Try to do strengthening exercises at least twice each week. Do these in addition to the moderate-intensity exercise.  Spend less time sitting. Even light physical activity can be beneficial. Other tips  Work with your health care provider to achieve or maintain a healthy weight.  Do not use any products that contain nicotine or tobacco, such as cigarettes, e-cigarettes, and chewing tobacco. If you need help quitting, ask your health care  provider.  Know your numbers. Ask your health care provider to check your cholesterol and your blood sugar (glucose). Continue to have your blood tested as directed by your health care provider. Do I need screening for cancer? Depending on your health history and family history, you may need to have cancer screening at different stages of  your life. This may include screening for:  Breast cancer.  Cervical cancer.  Lung cancer.  Colorectal cancer. What is my risk for osteoporosis? After menopause, you may be at increased risk for osteoporosis. Osteoporosis is a condition in which bone destruction happens more quickly than new bone creation. To help prevent osteoporosis or the bone fractures that can happen because of osteoporosis, you may take the following actions:  If you are 44-16 years old, get at least 1,000 mg of calcium and at least 600 mg of vitamin D per day.  If you are older than age 41 but younger than age 28, get at least 1,200 mg of calcium and at least 600 mg of vitamin D per day.  If you are older than age 48, get at least 1,200 mg of calcium and at least 800 mg of vitamin D per day. Smoking and drinking excessive alcohol increase the risk of osteoporosis. Eat foods that are rich in calcium and vitamin D, and do weight-bearing exercises several times each week as directed by your health care provider. How does menopause affect my mental health? Depression may occur at any age, but it is more common as you become older. Common symptoms of depression include:  Low or sad mood.  Changes in sleep patterns.  Changes in appetite or eating patterns.  Feeling an overall lack of motivation or enjoyment of activities that you previously enjoyed.  Frequent crying spells. Talk with your health care provider if you think that you are experiencing depression. General instructions See your health care provider for regular wellness exams and vaccines. This may include:  Scheduling regular health, dental, and eye exams.  Getting and maintaining your vaccines. These include: ? Influenza vaccine. Get this vaccine each year before the flu season begins. ? Pneumonia vaccine. ? Shingles vaccine. ? Tetanus, diphtheria, and pertussis (Tdap) booster vaccine. Your health care provider may also recommend other  immunizations. Tell your health care provider if you have ever been abused or do not feel safe at home. Summary  Menopause is a normal process in which your ability to get pregnant comes to an end.  This condition causes hot flashes, night sweats, decreased interest in sex, mood swings, headaches, or lack of sleep.  Treatment for this condition may include hormone replacement therapy.  Take actions to keep yourself healthy, including exercising regularly, eating a healthy diet, watching your weight, and checking your blood pressure and blood sugar levels.  Get screened for cancer and depression. Make sure that you are up to date with all your vaccines. This information is not intended to replace advice given to you by your health care provider. Make sure you discuss any questions you have with your health care provider. Document Released: 05/04/2005 Document Revised: 03/05/2018 Document Reviewed: 03/05/2018 Elsevier Patient Education  2020 [employer].

## 2019-01-12 NOTE — Assessment & Plan Note (Addendum)
Continue endo f/u (Q2 yrs)

## 2019-01-12 NOTE — Progress Notes (Signed)
This visit was conducted in person.  BP 134/82 (BP Location: Left Arm, Patient Position: Sitting, Cuff Size: Normal)   Pulse 73   Temp 97.6 F (36.4 C) (Temporal)   Ht 5' 6.25" (1.683 m)   Wt 180 lb 5 oz (81.8 kg)   SpO2 99%   BMI 28.88 kg/m    CC: CPE Subjective:    Patient ID: Kendra Jackson, female    DOB: [birth year], 58 y.o.   MRN: [id number]  HPI: Kendra Jackson is a 57 y.o. female presenting on 01/12/2019 for Annual Exam and Breast Pain (C/o left breast pain.  H/o cyst.  Has had pain for yrs.  Due for mammogram, 01/2019.)   Chronic L breast pain for years, h/o cyst to breast.   Preventative: COLONOSCOPY3/2018 - TA, rpt 5 yrs Vicente Males) Well woman - pap smear WNL 2016, remote h/o abnormal cells s/p cryotherapy. pap smear 12/2017 WNL with cervical stenosis (absent endocervical zone), rpt 3 yrs  LMP - 04/12/2009  Mammogram - 01/2018 - Birads1. Dense breasts.  Flu shotyearly  Tdap 2013  Shingrix - discussed, will start today Seat belt use discussed  Sunscreen use discussed. No changing moles on skin Non smoker  Alcohol - none  Dentist yearly Eye exam yearly  Caffeine: rare  Lives alone, no pets. Grown son 74yo, grandson died suddenly at 1yo 2010 (drowning) - lots of stress from this.  Occupation: Tour manager, works third shift- retired 2018! Activity: no regular exercise despite having treadmill, dances every weekend at club  Diet: good water, fruits daily, vegetables occasionally     Relevant past medical, surgical, family and social history reviewed and updated as indicated. Interim medical history since our last visit reviewed. Allergies and medications reviewed and updated. Outpatient Medications Prior to Visit  Medication Sig Dispense Refill  . aspirin 81 MG tablet Take 81 mg by mouth daily.    Marland Kitchen atenolol (TENORMIN) 50 MG tablet TAKE 1 TABLET BY MOUTH TWICE A DAY 180 tablet 3  . diphenhydrAMINE (BENADRYL) 25 MG tablet Take 25 mg by mouth at bedtime as  needed.    Marland Kitchen ELDERBERRY PO Take by mouth.    Marland Kitchen FLAXSEED, LINSEED, PO Take by mouth. Flaxseed powder    . fluticasone (FLONASE) 50 MCG/ACT nasal spray Place 2 sprays into both nostrils daily. 16 g 0  . Ginger, Zingiber officinalis, (GINGER ROOT) 550 MG CAPS Take 1 capsule by mouth daily.    . magnesium 30 MG tablet Take 30 mg by mouth 2 (two) times daily.    . Misc Natural Products (APPLE CIDER VINEGAR) TABS Take by mouth daily.    . Multiple Vitamin (MULTIVITAMIN) tablet Take 1 tablet by mouth daily.    Marland Kitchen omeprazole (PRILOSEC) 20 MG capsule TAKE 1 CAPSULE BY MOUTH EVERY DAY 30 capsule 5  . Red Yeast Rice 600 MG CAPS Take 1 capsule (600 mg total) by mouth 2 (two) times daily.    . TURMERIC PO Take by mouth.    . valACYclovir (VALTREX) 500 MG tablet Take 1 tablet (500 mg total) by mouth 2 (two) times daily. For 3 days 30 tablet 3   No facility-administered medications prior to visit.      Per HPI unless specifically indicated in ROS section below Review of Systems  Constitutional: Negative for activity change, appetite change, chills, fatigue, fever and unexpected weight change.  HENT: Negative for hearing loss.   Eyes: Negative for visual disturbance.  Respiratory: Negative for cough, chest tightness, shortness of breath  and wheezing.   Cardiovascular: Negative for chest pain, palpitations and leg swelling.  Gastrointestinal: Positive for constipation, nausea and vomiting (a few weeks ago - ?viral gastroenteritis). Negative for abdominal distention, abdominal pain, blood in stool and diarrhea.  Genitourinary: Negative for difficulty urinating and hematuria.  Musculoskeletal: Negative for arthralgias, myalgias and neck pain.  Skin: Negative for rash.  Neurological: Positive for dizziness (a few weeks ago - ?viral gastroenteritis). Negative for seizures, syncope and headaches.  Hematological: Negative for adenopathy. Bruises/bleeds easily.  Psychiatric/Behavioral: Negative for dysphoric  mood. The patient is not nervous/anxious.    Objective:    BP 134/82 (BP Location: Left Arm, Patient Position: Sitting, Cuff Size: Normal)   Pulse 73   Temp 97.6 F (36.4 C) (Temporal)   Ht 5' 6.25" (1.683 m)   Wt 180 lb 5 oz (81.8 kg)   SpO2 99%   BMI 28.88 kg/m   Wt Readings from Last 3 Encounters:  01/12/19 180 lb 5 oz (81.8 kg)  12/02/18 176 lb 1 oz (79.9 kg)  09/15/18 177 lb 7 oz (80.5 kg)    Physical Exam Vitals signs and nursing note reviewed. Exam conducted with a chaperone present.  Constitutional:      General: She is not in acute distress.    Appearance: Normal appearance. She is well-developed. She is not ill-appearing.  HENT:     Head: Normocephalic and atraumatic.     Right Ear: Hearing, tympanic membrane, ear canal and external ear normal.     Left Ear: Hearing, tympanic membrane, ear canal and external ear normal.     Mouth/Throat:     Mouth: Mucous membranes are moist.     Pharynx: Oropharynx is clear. Uvula midline. No oropharyngeal exudate or posterior oropharyngeal erythema.     Tonsils: No tonsillar abscesses.  Eyes:     General: No scleral icterus.    Conjunctiva/sclera: Conjunctivae normal.     Pupils: Pupils are equal, round, and reactive to light.  Neck:     Musculoskeletal: Normal range of motion and neck supple.  Cardiovascular:     Rate and Rhythm: Normal rate and regular rhythm.     Pulses: Normal pulses.          Radial pulses are 2+ on the right side and 2+ on the left side.     Heart sounds: Normal heart sounds. No murmur.  Pulmonary:     Effort: Pulmonary effort is normal. No respiratory distress.     Breath sounds: Normal breath sounds. No wheezing, rhonchi or rales.  Chest:     Breasts: Breasts are symmetrical.        Right: Normal. No swelling, bleeding, inverted nipple, mass, nipple discharge, skin change or tenderness.        Left: Tenderness present. No swelling, bleeding, inverted nipple, mass, nipple discharge or skin change.   Abdominal:     General: Abdomen is flat. Bowel sounds are normal. There is no distension.     Palpations: Abdomen is soft. There is no mass.     Tenderness: There is no abdominal tenderness. There is no guarding or rebound.     Hernia: No hernia is present.  Musculoskeletal: Normal range of motion.     Right lower leg: No edema.     Left lower leg: No edema.  Lymphadenopathy:     Cervical: No cervical adenopathy.     Upper Body:     Right upper body: No supraclavicular or axillary adenopathy.     Left  upper body: No supraclavicular or axillary adenopathy.  Skin:    General: Skin is warm and dry.     Findings: No rash.  Neurological:     General: No focal deficit present.     Mental Status: She is alert and oriented to person, place, and time.     Comments: CN grossly intact, station and gait intact  Psychiatric:        Mood and Affect: Mood normal.        Behavior: Behavior normal.        Thought Content: Thought content normal.        Judgment: Judgment normal.       Results for orders placed or performed in visit on 01/08/19  Microalbumin / creatinine urine ratio  Result Value Ref Range   Microalb, Ur 3.2 (H) 0.0 - 1.9 mg/dL   Creatinine,U 90.4 mg/dL   Microalb Creat Ratio 3.5 0.0 - 30.0 mg/g  Lipid panel  Result Value Ref Range   Cholesterol 261 (H) 0 - 200 mg/dL   Triglycerides 124.0 0.0 - 149.0 mg/dL   HDL 73.90 >39.00 mg/dL   VLDL 24.8 0.0 - 40.0 mg/dL   LDL Cholesterol 162 (H) 0 - 99 mg/dL   Total CHOL/HDL Ratio 4    NonHDL 187.28   Comprehensive metabolic panel  Result Value Ref Range   Sodium 138 135 - 145 mEq/L   Potassium 3.9 3.5 - 5.1 mEq/L   Chloride 102 96 - 112 mEq/L   CO2 30 19 - 32 mEq/L   Glucose, Bld 112 (H) 70 - 99 mg/dL   BUN 13 6 - 23 mg/dL   Creatinine, Ser 0.85 0.40 - 1.20 mg/dL   Total Bilirubin 0.4 0.2 - 1.2 mg/dL   Alkaline Phosphatase 94 39 - 117 U/L   AST 17 0 - 37 U/L   ALT 15 0 - 35 U/L   Total Protein 7.9 6.0 - 8.3 g/dL    Albumin 4.5 3.5 - 5.2 g/dL   Calcium 9.2 8.4 - 10.5 mg/dL   GFR 82.50 >60.00 mL/min   Assessment & Plan:   Problem List Items Addressed This Visit    Subclinical hyperthyroidism    Appreciate endo care.       Pain of left breast    Recurrent and ongoing diffuse L breast pain, with normal dx mammo/US 2018 with mildly complex 63mm cyst noted. Given ongoing pain, will rpt dx mammo/US this year. Pt agrees with plan. No R breast pain.       Relevant Orders   MM Digital Diagnostic Bilat   US BREAST COMPLETE UNI LEFT INC AXILLA   Multinodular goiter    Continue endo f/u (Q2 yrs)      HTN (hypertension)    Chronic, stable. Continue current regimen.       HLD (hyperlipidemia)    Chronic. Compliant with RYR BID. Continue TLC with RYR for 6 mo then recheck.  The 10-year ASCVD risk score [name] [name]., et al., 2013) is: 8.3%   Values used to calculate the score:     Age: 66 years     Sex: Female     Is Non-Hispanic African American: Yes     Diabetic: No     Tobacco smoker: No     Systolic Blood Pressure: Q000111Q mmHg     Is BP treated: Yes     HDL Cholesterol: 73.9 mg/dL     Total Cholesterol: 261 mg/dL  Healthcare maintenance - Primary    Preventative protocols reviewed and updated unless pt declined. Discussed healthy diet and lifestyle.        Other Visit Diagnoses    Need for shingles vaccine       Relevant Orders   Varicella-zoster vaccine IM (Completed)       No orders of the defined types were placed in this encounter.  Orders Placed This Encounter  Procedures  . MM Digital Diagnostic Bilat    Standing Status:   Future    Standing Expiration Date:   03/13/2020    Order Specific Question:   Reason for Exam (SYMPTOM  OR DIAGNOSIS REQUIRED)    Answer:   L breast pain    Order Specific Question:   Is the patient pregnant?    Answer:   No    Order Specific Question:   Preferred imaging location?    Answer:   [facility]  . US BREAST COMPLETE UNI LEFT INC  AXILLA    Standing Status:   Future    Standing Expiration Date:   03/13/2020    Order Specific Question:   Reason for Exam (SYMPTOM  OR DIAGNOSIS REQUIRED)    Answer:   L breast pain    Order Specific Question:   Preferred imaging location?    Answer:   [facility]  . Varicella-zoster vaccine IM    Follow up plan: Return in about 6 months (around 07/13/2019) for follow up visit.  Ria Bush, MD

## 2019-01-12 NOTE — Assessment & Plan Note (Signed)
Preventative protocols reviewed and updated unless pt declined. Discussed healthy diet and lifestyle.  

## 2019-01-12 NOTE — Assessment & Plan Note (Signed)
Appreciate endo care.  

## 2019-01-16 ENCOUNTER — Telehealth: Payer: Self-pay | Admitting: Family Medicine

## 2019-01-16 ENCOUNTER — Other Ambulatory Visit: Payer: Self-pay | Admitting: Family Medicine

## 2019-01-16 DIAGNOSIS — N644 Mastodynia: Secondary | ICD-10-CM

## 2019-01-16 NOTE — Telephone Encounter (Signed)
Patient called.  She tried to schedule a diagnostic mammogram at [facility].  [facility] told her the orders were incorrect.  [facility] told patient to call to let us know the orders were wrong and they won't let patient schedule appointment until order is fixed. Patient will call [facility] back on Monday to try and schedule appointment.

## 2019-01-16 NOTE — Telephone Encounter (Signed)
What do I need to order??

## 2019-01-17 NOTE — Telephone Encounter (Signed)
Ok I E-signed some orders for this.

## 2019-01-19 NOTE — Telephone Encounter (Signed)
Called patient and told her she could call to schedule that the orders were changed.

## 2019-01-23 ENCOUNTER — Ambulatory Visit
Admission: RE | Admit: 2019-01-23 | Discharge: 2019-01-23 | Disposition: A | Discharge status: Home / Self Care | Payer: Federal, State, Local not specified - PPO | Source: Ambulatory Visit | Attending: Family Medicine | Admitting: Family Medicine

## 2019-01-23 DIAGNOSIS — N644 Mastodynia: Secondary | ICD-10-CM

## 2019-01-23 DIAGNOSIS — R928 Other abnormal and inconclusive findings on diagnostic imaging of breast: Secondary | ICD-10-CM | POA: Diagnosis not present

## 2019-03-13 ENCOUNTER — Other Ambulatory Visit: Payer: Self-pay | Admitting: Family Medicine

## 2019-04-14 ENCOUNTER — Ambulatory Visit (INDEPENDENT_AMBULATORY_CARE_PROVIDER_SITE_OTHER): Payer: Federal, State, Local not specified - PPO

## 2019-04-14 ENCOUNTER — Other Ambulatory Visit: Payer: Self-pay

## 2019-04-14 DIAGNOSIS — Z23 Encounter for immunization: Secondary | ICD-10-CM

## 2019-04-14 NOTE — Progress Notes (Signed)
Patient given 2nd dose of Shingrix today in left deltoid. Patient tolerated well.

## 2019-07-13 ENCOUNTER — Other Ambulatory Visit: Payer: Self-pay

## 2019-07-13 ENCOUNTER — Ambulatory Visit: Payer: Federal, State, Local not specified - PPO | Admitting: Family Medicine

## 2019-07-13 ENCOUNTER — Encounter: Payer: Self-pay | Admitting: Family Medicine

## 2019-07-13 VITALS — BP 134/84 | HR 72 | Temp 97.2°F | Ht 66.25 in | Wt 172.3 lb

## 2019-07-13 DIAGNOSIS — E0590 Thyrotoxicosis, unspecified without thyrotoxic crisis or storm: Secondary | ICD-10-CM

## 2019-07-13 DIAGNOSIS — E785 Hyperlipidemia, unspecified: Secondary | ICD-10-CM

## 2019-07-13 DIAGNOSIS — I10 Essential (primary) hypertension: Secondary | ICD-10-CM | POA: Diagnosis not present

## 2019-07-13 DIAGNOSIS — Z8619 Personal history of other infectious and parasitic diseases: Secondary | ICD-10-CM | POA: Diagnosis not present

## 2019-07-13 LAB — LIPID PANEL
Cholesterol: 261 mg/dL — ABNORMAL HIGH (ref 0–200)
HDL: 79.9 mg/dL (ref >39.00)
LDL Cholesterol: 162 mg/dL — ABNORMAL HIGH (ref 0–99)
NonHDL: 181.1
Total CHOL/HDL Ratio: 3
Triglycerides: 97 mg/dL (ref 0.0–149.0)
VLDL: 19.4 mg/dL (ref 0.0–40.0)

## 2019-07-13 LAB — TSH: TSH: 0.56 u[IU]/mL (ref 0.35–4.50)

## 2019-07-13 LAB — T4, FREE: Free T4: 0.73 ng/dL (ref 0.60–1.60)

## 2019-07-13 MED ORDER — VALACYCLOVIR HCL 500 MG PO TABS: 500.0000 mg | ORAL_TABLET | Freq: Two times a day (BID) | ORAL | 3 refills | Status: DC

## 2019-07-13 NOTE — Patient Instructions (Addendum)
Labs today Return in 6 months for next physical.

## 2019-07-13 NOTE — Progress Notes (Signed)
This visit was conducted in person.  BP 134/84   Pulse 72   Temp (!) 97.2 F (36.2 C) (Temporal)   Ht 5' 6.25" (1.683 m)   Wt 172 lb 5 oz (78.2 kg)   SpO2 97%   BMI 27.60 kg/m    CC: 6 mo f/u visit  Subjective:    Patient ID: Kendra Jackson, female    DOB: [birth year], 59 y.o.   MRN: [id number]  HPI: Kendra Jackson is a 59 y.o. female presenting on 07/13/2019 for Follow-up   HTN - Compliant with current antihypertensive regimen of atenolol 50mg  bid.  Does not check blood pressures at home. No low blood pressure readings or symptoms of dizziness/syncope. Denies HA, vision changes, CP/tightness, SOB, leg swelling.   HLD - stopped RYR BID, started garlic pills about 3 months ago. Tolerated RYR ok.   Requests valtrex refilled - takes BID PRN outbreak.  Has lost weight - with healthy diet changes.      Relevant past medical, surgical, family and social history reviewed and updated as indicated. Interim medical history since our last visit reviewed. Allergies and medications reviewed and updated. Outpatient Medications Prior to Visit  Medication Sig Dispense Refill  . aspirin 81 MG tablet Take 81 mg by mouth daily.    Marland Kitchen atenolol (TENORMIN) 50 MG tablet TAKE 1 TABLET BY MOUTH TWICE A DAY 180 tablet 3  . diphenhydrAMINE (BENADRYL) 25 MG tablet Take 25 mg by mouth at bedtime as needed.    Marland Kitchen GARLIC PO Take by mouth.    . Ginger, Zingiber officinalis, (GINGER ROOT) 550 MG CAPS Take 1 capsule by mouth daily.    . magnesium 30 MG tablet Take 30 mg by mouth 2 (two) times daily.    . Misc Natural Products (APPLE CIDER VINEGAR) TABS Take by mouth daily.    . Multiple Vitamin (MULTIVITAMIN) tablet Take 1 tablet by mouth daily.    Marland Kitchen omeprazole (PRILOSEC) 20 MG capsule TAKE 1 CAPSULE BY MOUTH EVERY DAY 90 capsule 2  . TURMERIC PO Take by mouth.    . ELDERBERRY PO Take by mouth.    Marland Kitchen FLAXSEED, LINSEED, PO Take by mouth. Flaxseed powder    . fluticasone (FLONASE) 50 MCG/ACT nasal spray Place 2  sprays into both nostrils daily. 16 g 0  . valACYclovir (VALTREX) 500 MG tablet Take 1 tablet (500 mg total) by mouth 2 (two) times daily. For 3 days 30 tablet 3  . Red Yeast Rice 600 MG CAPS Take 1 capsule (600 mg total) by mouth 2 (two) times daily. (Patient not taking: Reported on 07/13/2019)     No facility-administered medications prior to visit.     Per HPI unless specifically indicated in ROS section below Review of Systems Objective:    BP 134/84   Pulse 72   Temp (!) 97.2 F (36.2 C) (Temporal)   Ht 5' 6.25" (1.683 m)   Wt 172 lb 5 oz (78.2 kg)   SpO2 97%   BMI 27.60 kg/m   Wt Readings from Last 3 Encounters:  07/13/19 172 lb 5 oz (78.2 kg)  01/12/19 180 lb 5 oz (81.8 kg)  12/02/18 176 lb 1 oz (79.9 kg)    Physical Exam Vitals and nursing note reviewed.  Constitutional:      Appearance: Normal appearance. She is not ill-appearing.  Eyes:     Comments: cataracts  Cardiovascular:     Rate and Rhythm: Normal rate and regular rhythm.     Pulses:  Normal pulses.     Heart sounds: Normal heart sounds. No murmur.  Pulmonary:     Effort: Pulmonary effort is normal. No respiratory distress.     Breath sounds: Normal breath sounds. No wheezing, rhonchi or rales.  Musculoskeletal:     Right lower leg: No edema.     Left lower leg: No edema.  Neurological:     Mental Status: She is alert.  Psychiatric:        Mood and Affect: Mood normal.        Behavior: Behavior normal.       Results for orders placed or performed in visit on 01/08/19  Microalbumin / creatinine urine ratio  Result Value Ref Range   Microalb, Ur 3.2 (H) 0.0 - 1.9 mg/dL   Creatinine,U 90.4 mg/dL   Microalb Creat Ratio 3.5 0.0 - 30.0 mg/g  Lipid panel  Result Value Ref Range   Cholesterol 261 (H) 0 - 200 mg/dL   Triglycerides 124.0 0.0 - 149.0 mg/dL   HDL 73.90 >39.00 mg/dL   VLDL 24.8 0.0 - 40.0 mg/dL   LDL Cholesterol 162 (H) 0 - 99 mg/dL   Total CHOL/HDL Ratio 4    NonHDL 187.28     Comprehensive metabolic panel  Result Value Ref Range   Sodium 138 135 - 145 mEq/L   Potassium 3.9 3.5 - 5.1 mEq/L   Chloride 102 96 - 112 mEq/L   CO2 30 19 - 32 mEq/L   Glucose, Bld 112 (H) 70 - 99 mg/dL   BUN 13 6 - 23 mg/dL   Creatinine, Ser 0.85 0.40 - 1.20 mg/dL   Total Bilirubin 0.4 0.2 - 1.2 mg/dL   Alkaline Phosphatase 94 39 - 117 U/L   AST 17 0 - 37 U/L   ALT 15 0 - 35 U/L   Total Protein 7.9 6.0 - 8.3 g/dL   Albumin 4.5 3.5 - 5.2 g/dL   Calcium 9.2 8.4 - 10.5 mg/dL   GFR 82.50 >60.00 mL/min   Lab Results  Component Value Date   TSH 0.44 09/15/2018   T3TOTAL 108 09/15/2018    Assessment & Plan:  This visit occurred during the SARS-CoV-2 public health emergency.  Safety protocols were in place, including screening questions prior to the visit, additional usage of staff PPE, and extensive cleaning of exam room while observing appropriate contact time as indicated for disinfecting solutions.   Problem List Items Addressed This Visit    Subclinical hyperthyroidism    Update thyroid levels.       Relevant Orders   TSH   T4, free   HTN (hypertension)    Chronic, stable. Continue current regimen       HLD (hyperlipidemia) - Primary    Chronic, she self stopped RYR (was on BID for several years) and has started garlic pills. Will update FLP.  The 10-year ASCVD risk score [name] [name]., et al., 2013) is: 8.3%   Values used to calculate the score:     Age: 89 years     Sex: Female     Is Non-Hispanic African American: Yes     Diabetic: No     Tobacco smoker: No     Systolic Blood Pressure: Q000111Q mmHg     Is BP treated: Yes     HDL Cholesterol: 73.9 mg/dL     Total Cholesterol: 261 mg/dL       Relevant Orders   Lipid panel   History of herpes genitalis  PRN valtrex refilled          Meds ordered this encounter  Medications  . valACYclovir (VALTREX) 500 MG tablet    Sig: Take 1 tablet (500 mg total) by mouth 2 (two) times daily. For 3 days    Dispense:   30 tablet    Refill:  3   Orders Placed This Encounter  Procedures  . Lipid panel  . TSH  . T4, free    Follow up plan: Return in about 6 months (around 01/12/2020) for annual exam, prior fasting for blood work.  Ria Bush, MD

## 2019-07-13 NOTE — Assessment & Plan Note (Signed)
PRN valtrex refilled.  

## 2019-07-13 NOTE — Assessment & Plan Note (Signed)
Update thyroid levels.

## 2019-07-13 NOTE — Assessment & Plan Note (Signed)
Chronic, stable. Continue current regimen. 

## 2019-07-13 NOTE — Assessment & Plan Note (Signed)
Chronic, she self stopped RYR (was on BID for several years) and has started garlic pills. Will update FLP.  The 10-year ASCVD risk score [name] [name]., et al., 2013) is: 8.3%   Values used to calculate the score:     Age: 59 years     Sex: Female     Is Non-Hispanic African American: Yes     Diabetic: No     Tobacco smoker: No     Systolic Blood Pressure: Q000111Q mmHg     Is BP treated: Yes     HDL Cholesterol: 73.9 mg/dL     Total Cholesterol: 261 mg/dL

## 2019-07-20 ENCOUNTER — Other Ambulatory Visit: Payer: Self-pay | Admitting: Family Medicine

## 2019-12-16 ENCOUNTER — Other Ambulatory Visit: Payer: Self-pay | Admitting: Family Medicine

## 2019-12-25 ENCOUNTER — Other Ambulatory Visit: Payer: Self-pay | Admitting: Family Medicine

## 2020-01-10 ENCOUNTER — Other Ambulatory Visit: Payer: Self-pay | Admitting: Family Medicine

## 2020-01-10 DIAGNOSIS — E785 Hyperlipidemia, unspecified: Secondary | ICD-10-CM

## 2020-01-10 DIAGNOSIS — I10 Essential (primary) hypertension: Secondary | ICD-10-CM

## 2020-01-10 DIAGNOSIS — E0590 Thyrotoxicosis, unspecified without thyrotoxic crisis or storm: Secondary | ICD-10-CM

## 2020-01-13 ENCOUNTER — Other Ambulatory Visit (INDEPENDENT_AMBULATORY_CARE_PROVIDER_SITE_OTHER): Payer: Federal, State, Local not specified - PPO

## 2020-01-13 ENCOUNTER — Other Ambulatory Visit: Payer: Self-pay

## 2020-01-13 DIAGNOSIS — E785 Hyperlipidemia, unspecified: Secondary | ICD-10-CM

## 2020-01-13 DIAGNOSIS — I10 Essential (primary) hypertension: Secondary | ICD-10-CM

## 2020-01-13 DIAGNOSIS — E0590 Thyrotoxicosis, unspecified without thyrotoxic crisis or storm: Secondary | ICD-10-CM

## 2020-01-13 LAB — LIPID PANEL
Cholesterol: 246 mg/dL — ABNORMAL HIGH (ref 0–200)
HDL: 72.2 mg/dL (ref >39.00)
LDL Cholesterol: 151 mg/dL — ABNORMAL HIGH (ref 0–99)
NonHDL: 173.31
Total CHOL/HDL Ratio: 3
Triglycerides: 111 mg/dL (ref 0.0–149.0)
VLDL: 22.2 mg/dL (ref 0.0–40.0)

## 2020-01-13 LAB — COMPREHENSIVE METABOLIC PANEL
ALT: 13 U/L (ref 0–35)
AST: 16 U/L (ref 0–37)
Albumin: 4.4 g/dL (ref 3.5–5.2)
Alkaline Phosphatase: 90 U/L (ref 39–117)
BUN: 16 mg/dL (ref 6–23)
CO2: 32 mEq/L (ref 19–32)
Calcium: 9.4 mg/dL (ref 8.4–10.5)
Chloride: 103 mEq/L (ref 96–112)
Creatinine, Ser: 0.89 mg/dL (ref 0.40–1.20)
GFR: 69.85 mL/min (ref >60.00)
Glucose, Bld: 76 mg/dL (ref 70–99)
Potassium: 4.1 mEq/L (ref 3.5–5.1)
Sodium: 141 mEq/L (ref 135–145)
Total Bilirubin: 0.4 mg/dL (ref 0.2–1.2)
Total Protein: 7.4 g/dL (ref 6.0–8.3)

## 2020-01-13 LAB — MICROALBUMIN / CREATININE URINE RATIO
Creatinine,U: 124.1 mg/dL
Microalb Creat Ratio: 0.9 mg/g (ref 0.0–30.0)
Microalb, Ur: 1.1 mg/dL (ref 0.0–1.9)

## 2020-01-13 LAB — TSH: TSH: 0.69 u[IU]/mL (ref 0.35–4.50)

## 2020-01-13 LAB — T4, FREE: Free T4: 0.75 ng/dL (ref 0.60–1.60)

## 2020-01-20 ENCOUNTER — Ambulatory Visit (INDEPENDENT_AMBULATORY_CARE_PROVIDER_SITE_OTHER): Payer: Federal, State, Local not specified - PPO | Admitting: Family Medicine

## 2020-01-20 ENCOUNTER — Other Ambulatory Visit: Payer: Self-pay

## 2020-01-20 ENCOUNTER — Encounter: Payer: Self-pay | Admitting: Family Medicine

## 2020-01-20 VITALS — BP 140/90 | HR 80 | Temp 98.1°F | Ht 66.5 in | Wt 177.6 lb

## 2020-01-20 DIAGNOSIS — E0590 Thyrotoxicosis, unspecified without thyrotoxic crisis or storm: Secondary | ICD-10-CM

## 2020-01-20 DIAGNOSIS — E785 Hyperlipidemia, unspecified: Secondary | ICD-10-CM

## 2020-01-20 DIAGNOSIS — E042 Nontoxic multinodular goiter: Secondary | ICD-10-CM

## 2020-01-20 DIAGNOSIS — Z8619 Personal history of other infectious and parasitic diseases: Secondary | ICD-10-CM

## 2020-01-20 DIAGNOSIS — I10 Essential (primary) hypertension: Secondary | ICD-10-CM

## 2020-01-20 DIAGNOSIS — K2100 Gastro-esophageal reflux disease with esophagitis, without bleeding: Secondary | ICD-10-CM

## 2020-01-20 DIAGNOSIS — Z23 Encounter for immunization: Secondary | ICD-10-CM

## 2020-01-20 DIAGNOSIS — Z0000 Encounter for general adult medical examination without abnormal findings: Secondary | ICD-10-CM

## 2020-01-20 MED ORDER — VALACYCLOVIR HCL 500 MG PO TABS: 500.0000 mg | ORAL_TABLET | Freq: Two times a day (BID) | ORAL | 3 refills | Status: DC

## 2020-01-20 MED ORDER — ATENOLOL 50 MG PO TABS: 50.0000 mg | ORAL_TABLET | Freq: Two times a day (BID) | ORAL | 3 refills | Status: DC

## 2020-01-20 NOTE — Assessment & Plan Note (Signed)
Chronic, stable on RYR BID dosing. Declines statin.  The 10-year ASCVD risk score [name] [name]., et al., 2013) is: 9.7%   Values used to calculate the score:     Age: 59 years     Sex: Female     Is Non-Hispanic African American: Yes     Diabetic: No     Tobacco smoker: No     Systolic Blood Pressure: 698 mmHg     Is BP treated: Yes     HDL Cholesterol: 72.2 mg/dL     Total Cholesterol: 246 mg/dL

## 2020-01-20 NOTE — Assessment & Plan Note (Signed)
Preventative protocols reviewed and updated unless pt declined. Discussed healthy diet and lifestyle.  

## 2020-01-20 NOTE — Assessment & Plan Note (Signed)
Overdue for endo f/u - she will call to schedule.

## 2020-01-20 NOTE — Assessment & Plan Note (Signed)
Continues omeprazole 20mg  daily. Discussed trial taper.

## 2020-01-20 NOTE — Progress Notes (Signed)
This visit was conducted in person.  BP 140/90 (BP Location: Left Arm, Patient Position: Sitting, Cuff Size: Normal)   Pulse 80   Temp 98.1 F (36.7 C) (Temporal)   Ht 5' 6.5" (1.689 m)   Wt 177 lb 9 oz (80.5 kg)   SpO2 98%   BMI 28.23 kg/m   BP Readings from Last 3 Encounters:  01/20/20 140/90  07/13/19 134/84  01/12/19 134/82    CC: CPE Subjective:    Patient ID: Kendra Jackson, female    DOB: 1960-11-18, 59 y.o.   MRN: [id number]  HPI: Kendra Jackson is a 59 y.o. female presenting on 01/20/2020 for Annual Exam   Preventative: COLONOSCOPY3/2018 - TA, rpt 5 yrs Vicente Males). She remains concerned due to blood in stool - likely from known internal hemorrhoids.  Well woman - remote h/o abnormal cells s/p cryotherapy.pap smear 12/2017 WNL with cervical stenosis (absent endocervical zone), rpt 3 yrs  LMP - 04/12/2009  Mammogram - 12/2018 - [facility]. Dense breasts. Breast pain overall improved.  Lung cancer screening - not eligible Flu shotyearly  Tdap 2013  [place] 06/2019, 07/2019 shingrix 12/2018, 03/2019 Shingrix - discussed, will start today Seat belt use discussed  Sunscreen use discussed. No changing moles on skin Non smoker  Alcohol -none  Dentist yearly Eye exam yearly - due  Caffeine: rare  Lives alone, no pets. Grown son 12yo, grandson died suddenly at 1yo 2010 (drowning) - lots of stress from this.  Occupation: Tour manager, works third shift- retired 2018! Activity: no regular exercisedespite having treadmill, dances every weekend at club Diet: good water, fruits daily, vegetables occasionally     Relevant past medical, surgical, family and social history reviewed and updated as indicated. Interim medical history since our last visit reviewed. Allergies and medications reviewed and updated. Outpatient Medications Prior to Visit  Medication Sig Dispense Refill  . diphenhydrAMINE (BENADRYL) 25 MG tablet Take 25 mg by mouth at bedtime as  needed.    Marland Kitchen GARLIC PO Take by mouth.    . Glucosamine HCl (GLUCOSAMINE PO) Take by mouth daily.    . magnesium 30 MG tablet Take 30 mg by mouth 2 (two) times daily.    . Multiple Vitamin (MULTIVITAMIN) tablet Take 1 tablet by mouth daily.    Marland Kitchen omeprazole (PRILOSEC) 20 MG capsule TAKE 1 CAPSULE BY MOUTH EVERY DAY 90 capsule 0  . Red Yeast Rice 600 MG CAPS Take 1 capsule (600 mg total) by mouth 2 (two) times daily.    . TURMERIC PO Take by mouth.    Marland Kitchen aspirin 81 MG tablet Take 81 mg by mouth daily.    Marland Kitchen atenolol (TENORMIN) 50 MG tablet TAKE 1 TABLET BY MOUTH TWICE A DAY 180 tablet 0  . valACYclovir (VALTREX) 500 MG tablet Take 1 tablet (500 mg total) by mouth 2 (two) times daily. For 3 days 30 tablet 3  . Ginger, Zingiber officinalis, (GINGER ROOT) 550 MG CAPS Take 1 capsule by mouth daily. (Patient not taking: Reported on 01/20/2020)    . Misc Natural Products (APPLE CIDER VINEGAR) TABS Take by mouth daily. (Patient not taking: Reported on 01/20/2020)     No facility-administered medications prior to visit.     Per HPI unless specifically indicated in ROS section below Review of Systems  Constitutional: Negative for activity change, appetite change, chills, fatigue, fever and unexpected weight change.  HENT: Negative for hearing loss.   Eyes: Negative for visual disturbance.  Respiratory: Negative for cough, chest  tightness, shortness of breath and wheezing.   Cardiovascular: Negative for chest pain, palpitations and leg swelling.  Gastrointestinal: Positive for blood in stool. Negative for abdominal distention, abdominal pain, constipation, diarrhea, nausea and vomiting.  Genitourinary: Negative for difficulty urinating and hematuria.  Musculoskeletal: Negative for arthralgias, myalgias and neck pain.  Skin: Negative for rash.  Neurological: Negative for dizziness, seizures, syncope and headaches.  Hematological: Negative for adenopathy. Does not bruise/bleed easily.    Psychiatric/Behavioral: Negative for dysphoric mood. The patient is not nervous/anxious.    Objective:  BP 140/90 (BP Location: Left Arm, Patient Position: Sitting, Cuff Size: Normal)   Pulse 80   Temp 98.1 F (36.7 C) (Temporal)   Ht 5' 6.5" (1.689 m)   Wt 177 lb 9 oz (80.5 kg)   SpO2 98%   BMI 28.23 kg/m   Wt Readings from Last 3 Encounters:  01/20/20 177 lb 9 oz (80.5 kg)  07/13/19 172 lb 5 oz (78.2 kg)  01/12/19 180 lb 5 oz (81.8 kg)      Physical Exam Vitals and nursing note reviewed.  Constitutional:      General: She is not in acute distress.    Appearance: Normal appearance. She is well-developed. She is not ill-appearing.  HENT:     Head: Normocephalic and atraumatic.     Right Ear: Hearing, tympanic membrane, ear canal and external ear normal.     Left Ear: Hearing, tympanic membrane, ear canal and external ear normal.  Eyes:     General: No scleral icterus.    Extraocular Movements: Extraocular movements intact.     Conjunctiva/sclera: Conjunctivae normal.     Pupils: Pupils are equal, round, and reactive to light.  Neck:     Thyroid: No thyroid mass or thyromegaly.  Cardiovascular:     Rate and Rhythm: Normal rate and regular rhythm.     Pulses: Normal pulses.          Radial pulses are 2+ on the right side and 2+ on the left side.     Heart sounds: Normal heart sounds. No murmur heard.   Pulmonary:     Effort: Pulmonary effort is normal. No respiratory distress.     Breath sounds: Normal breath sounds. No wheezing, rhonchi or rales.  Chest:     Comments: Breast exam - declined Abdominal:     General: Abdomen is flat. Bowel sounds are normal. There is no distension.     Palpations: Abdomen is soft. There is no mass.     Tenderness: There is no abdominal tenderness. There is no guarding or rebound.     Hernia: No hernia is present.  Musculoskeletal:        General: Normal range of motion.     Cervical back: Normal range of motion and neck supple.      Right lower leg: No edema.     Left lower leg: No edema.  Lymphadenopathy:     Cervical: No cervical adenopathy.  Skin:    General: Skin is warm and dry.     Findings: No rash.  Neurological:     General: No focal deficit present.     Mental Status: She is alert and oriented to person, place, and time.     Comments: CN grossly intact, station and gait intact  Psychiatric:        Mood and Affect: Mood normal.        Behavior: Behavior normal.        Thought Content: Thought  content normal.        Judgment: Judgment normal.       Results for orders placed or performed in visit on 01/13/20  Microalbumin / creatinine urine ratio  Result Value Ref Range   Microalb, Ur 1.1 0.0 - 1.9 mg/dL   Creatinine,U 124.1 mg/dL   Microalb Creat Ratio 0.9 0.0 - 30.0 mg/g  T4, free  Result Value Ref Range   Free T4 0.75 0.60 - 1.60 ng/dL  TSH  Result Value Ref Range   TSH 0.69 0.35 - 4.50 uIU/mL  Comprehensive metabolic panel  Result Value Ref Range   Sodium 141 135 - 145 mEq/L   Potassium 4.1 3.5 - 5.1 mEq/L   Chloride 103 96 - 112 mEq/L   CO2 32 19 - 32 mEq/L   Glucose, Bld 76 70 - 99 mg/dL   BUN 16 6 - 23 mg/dL   Creatinine, Ser 0.89 0.40 - 1.20 mg/dL   Total Bilirubin 0.4 0.2 - 1.2 mg/dL   Alkaline Phosphatase 90 39 - 117 U/L   AST 16 0 - 37 U/L   ALT 13 0 - 35 U/L   Total Protein 7.4 6.0 - 8.3 g/dL   Albumin 4.4 3.5 - 5.2 g/dL   GFR 69.85 >60.00 mL/min   Calcium 9.4 8.4 - 10.5 mg/dL  Lipid panel  Result Value Ref Range   Cholesterol 246 (H) 0 - 200 mg/dL   Triglycerides 111.0 0 - 149 mg/dL   HDL 72.20 >39.00 mg/dL   VLDL 22.2 0.0 - 40.0 mg/dL   LDL Cholesterol 151 (H) 0 - 99 mg/dL   Total CHOL/HDL Ratio 3    NonHDL 173.31    Assessment & Plan:  This visit occurred during the SARS-CoV-2 public health emergency.  Safety protocols were in place, including screening questions prior to the visit, additional usage of staff PPE, and extensive cleaning of exam room while observing  appropriate contact time as indicated for disinfecting solutions.   Problem List Items Addressed This Visit    Subclinical hyperthyroidism    Overdue for endo f/u - she will call to schedule.       Relevant Medications   atenolol (TENORMIN) 50 MG tablet   Reflux esophagitis    Continues omeprazole 20mg  daily. Discussed trial taper.       Multinodular goiter    Overdue for endo f/u - she will call to schedule.       Relevant Medications   atenolol (TENORMIN) 50 MG tablet   HTN (hypertension)    Chronic, stable on current regimen - continue.       Relevant Medications   atenolol (TENORMIN) 50 MG tablet   HLD (hyperlipidemia)    Chronic, stable on RYR BID dosing. Declines statin.  The 10-year ASCVD risk score [name] [name]., et al., 2013) is: 9.7%   Values used to calculate the score:     Age: 24 years     Sex: Female     Is Non-Hispanic African American: Yes     Diabetic: No     Tobacco smoker: No     Systolic Blood Pressure: 678 mmHg     Is BP treated: Yes     HDL Cholesterol: 72.2 mg/dL     Total Cholesterol: 246 mg/dL       Relevant Medications   atenolol (TENORMIN) 50 MG tablet   History of herpes genitalis    PRN valtrex refilled.       Healthcare maintenance - Primary  Preventative protocols reviewed and updated unless pt declined. Discussed healthy diet and lifestyle.        Other Visit Diagnoses    Need for influenza vaccination       Relevant Orders   Flu Vaccine QUAD 36+ mos IM (Completed)       Meds ordered this encounter  Medications  . valACYclovir (VALTREX) 500 MG tablet    Sig: Take 1 tablet (500 mg total) by mouth 2 (two) times daily. For 3 days    Dispense:  30 tablet    Refill:  3  . atenolol (TENORMIN) 50 MG tablet    Sig: Take 1 tablet (50 mg total) by mouth 2 (two) times daily.    Dispense:  180 tablet    Refill:  3   Orders Placed This Encounter  Procedures  . Flu Vaccine QUAD 36+ mos IM    Patient instructions: Flu shot  today  Continue working on low cholesterol diet and continue red yeast rice.  Call endocrinology office to ask about follow up for thyroid.  Call to schedule mammogram.   Return as needed or in 1 year for next physical.   Follow up plan: Return in about 1 year (around 01/19/2021) for annual exam, prior fasting for blood work.  Ria Bush, MD

## 2020-01-20 NOTE — Patient Instructions (Addendum)
Flu shot today  Continue working on low cholesterol diet and continue red yeast rice.  Call endocrinology office to ask about follow up for thyroid.  Call to schedule mammogram.   Return as needed or in 1 year for next physical.   Health Maintenance for Postmenopausal Women Menopause is a normal process in which your ability to get pregnant comes to an end. This process happens slowly over many months or years, usually between the ages of 19 and 99. Menopause is complete when you have missed your menstrual periods for 12 months. It is important to talk with your health care provider about some of the most common conditions that affect women after menopause (postmenopausal women). These include heart disease, cancer, and bone loss (osteoporosis). Adopting a healthy lifestyle and getting preventive care can help to promote your health and wellness. The actions you take can also lower your chances of developing some of these common conditions. What should I know about menopause? During menopause, you may get a number of symptoms, such as:  Hot flashes. These can be moderate or severe.  Night sweats.  Decrease in sex drive.  Mood swings.  Headaches.  Tiredness.  Irritability.  Memory problems.  Insomnia. Choosing to treat or not to treat these symptoms is a decision that you make with your health care provider. Do I need hormone replacement therapy?  Hormone replacement therapy is effective in treating symptoms that are caused by menopause, such as hot flashes and night sweats.  Hormone replacement carries certain risks, especially as you become older. If you are thinking about using estrogen or estrogen with progestin, discuss the benefits and risks with your health care provider. What is my risk for heart disease and stroke? The risk of heart disease, heart attack, and stroke increases as you age. One of the causes may be a change in the body's hormones during menopause. This can  affect how your body uses dietary fats, triglycerides, and cholesterol. Heart attack and stroke are medical emergencies. There are many things that you can do to help prevent heart disease and stroke. Watch your blood pressure  High blood pressure causes heart disease and increases the risk of stroke. This is more likely to develop in people who have high blood pressure readings, are of African descent, or are overweight.  Have your blood pressure checked: ? Every 3-5 years if you are 49-47 years of age. ? Every year if you are 80 years old or older. Eat a healthy diet   Eat a diet that includes plenty of vegetables, fruits, low-fat dairy products, and lean protein.  Do not eat a lot of foods that are high in solid fats, added sugars, or sodium. Get regular exercise Get regular exercise. This is one of the most important things you can do for your health. Most adults should:  Try to exercise for at least 150 minutes each week. The exercise should increase your heart rate and make you sweat (moderate-intensity exercise).  Try to do strengthening exercises at least twice each week. Do these in addition to the moderate-intensity exercise.  Spend less time sitting. Even light physical activity can be beneficial. Other tips  Work with your health care provider to achieve or maintain a healthy weight.  Do not use any products that contain nicotine or tobacco, such as cigarettes, e-cigarettes, and chewing tobacco. If you need help quitting, ask your health care provider.  Know your numbers. Ask your health care provider to check your cholesterol and  your blood sugar (glucose). Continue to have your blood tested as directed by your health care provider. Do I need screening for cancer? Depending on your health history and family history, you may need to have cancer screening at different stages of your life. This may include screening for:  Breast cancer.  Cervical cancer.  Lung  cancer.  Colorectal cancer. What is my risk for osteoporosis? After menopause, you may be at increased risk for osteoporosis. Osteoporosis is a condition in which bone destruction happens more quickly than new bone creation. To help prevent osteoporosis or the bone fractures that can happen because of osteoporosis, you may take the following actions:  If you are 3-65 years old, get at least 1,000 mg of calcium and at least 600 mg of vitamin D per day.  If you are older than age 49 but younger than age 70, get at least 1,200 mg of calcium and at least 600 mg of vitamin D per day.  If you are older than age 50, get at least 1,200 mg of calcium and at least 800 mg of vitamin D per day. Smoking and drinking excessive alcohol increase the risk of osteoporosis. Eat foods that are rich in calcium and vitamin D, and do weight-bearing exercises several times each week as directed by your health care provider. How does menopause affect my mental health? Depression may occur at any age, but it is more common as you become older. Common symptoms of depression include:  Low or sad mood.  Changes in sleep patterns.  Changes in appetite or eating patterns.  Feeling an overall lack of motivation or enjoyment of activities that you previously enjoyed.  Frequent crying spells. Talk with your health care provider if you think that you are experiencing depression. General instructions See your health care provider for regular wellness exams and vaccines. This may include:  Scheduling regular health, dental, and eye exams.  Getting and maintaining your vaccines. These include: ? Influenza vaccine. Get this vaccine each year before the flu season begins. ? Pneumonia vaccine. ? Shingles vaccine. ? Tetanus, diphtheria, and pertussis (Tdap) booster vaccine. Your health care provider may also recommend other immunizations. Tell your health care provider if you have ever been abused or do not feel safe at  home. Summary  Menopause is a normal process in which your ability to get pregnant comes to an end.  This condition causes hot flashes, night sweats, decreased interest in sex, mood swings, headaches, or lack of sleep.  Treatment for this condition may include hormone replacement therapy.  Take actions to keep yourself healthy, including exercising regularly, eating a healthy diet, watching your weight, and checking your blood pressure and blood sugar levels.  Get screened for cancer and depression. Make sure that you are up to date with all your vaccines. This information is not intended to replace advice given to you by your health care provider. Make sure you discuss any questions you have with your health care provider. Document Revised: 03/05/2018 Document Reviewed: 03/05/2018 Elsevier Patient Education  2020 [employer].

## 2020-01-20 NOTE — Assessment & Plan Note (Signed)
PRN valtrex refilled.

## 2020-01-20 NOTE — Assessment & Plan Note (Signed)
Chronic, stable on current regimen - continue. 

## 2020-01-22 ENCOUNTER — Other Ambulatory Visit: Payer: Self-pay | Admitting: Family Medicine

## 2020-01-22 DIAGNOSIS — Z1231 Encounter for screening mammogram for malignant neoplasm of breast: Secondary | ICD-10-CM

## 2020-01-30 ENCOUNTER — Other Ambulatory Visit: Payer: Self-pay | Admitting: Family Medicine

## 2020-02-19 ENCOUNTER — Other Ambulatory Visit: Payer: Self-pay | Admitting: Family Medicine

## 2020-03-02 ENCOUNTER — Ambulatory Visit
Admission: RE | Admit: 2020-03-02 | Discharge: 2020-03-02 | Disposition: A | Discharge status: Home / Self Care | Payer: Federal, State, Local not specified - PPO | Source: Ambulatory Visit | Attending: Family Medicine | Admitting: Family Medicine

## 2020-03-02 ENCOUNTER — Other Ambulatory Visit: Payer: Self-pay

## 2020-03-02 DIAGNOSIS — Z1231 Encounter for screening mammogram for malignant neoplasm of breast: Secondary | ICD-10-CM | POA: Diagnosis not present

## 2020-03-09 ENCOUNTER — Other Ambulatory Visit: Payer: Self-pay | Admitting: Family Medicine

## 2020-04-19 DIAGNOSIS — H00025 Hordeolum internum left lower eyelid: Secondary | ICD-10-CM | POA: Diagnosis not present

## 2020-04-25 ENCOUNTER — Other Ambulatory Visit: Payer: Self-pay

## 2020-04-25 ENCOUNTER — Ambulatory Visit (HOSPITAL_COMMUNITY)
Admission: EM | Admit: 2020-04-25 | Discharge: 2020-04-25 | Disposition: A | Discharge status: Home / Self Care | Payer: Federal, State, Local not specified - PPO | Attending: Internal Medicine | Admitting: Internal Medicine

## 2020-04-25 ENCOUNTER — Encounter (HOSPITAL_COMMUNITY): Payer: Self-pay

## 2020-04-25 ENCOUNTER — Telehealth: Payer: Self-pay | Admitting: *Deleted

## 2020-04-25 DIAGNOSIS — U071 COVID-19: Secondary | ICD-10-CM | POA: Insufficient documentation

## 2020-04-25 DIAGNOSIS — Z79899 Other long term (current) drug therapy: Secondary | ICD-10-CM | POA: Diagnosis not present

## 2020-04-25 DIAGNOSIS — R059 Cough, unspecified: Secondary | ICD-10-CM

## 2020-04-25 MED ORDER — HYDROCOD POLST-CPM POLST ER 10-8 MG/5ML PO SUER: 5.0000 mL | Freq: Two times a day (BID) | ORAL | 0 refills | Status: DC | PRN

## 2020-04-25 NOTE — Telephone Encounter (Signed)
Agree with eval

## 2020-04-25 NOTE — ED Provider Notes (Signed)
[place]    CSN: [id number] Arrival date & time: 04/25/20  1654      History   Chief Complaint Chief Complaint  Patient presents with  . Cough    HPI Kendra Jackson is a 60 y.o. female comes to the urgent care with nonproductive cough which started yesterday.  Patient attended a funeral over the weekend.  She denies any sore throat, nausea or vomiting.  No body aches, fever or chills.  Patient is fully vaccinated and boosted against COVID-19 virus.  No abdominal pain.  HPI  Past Medical History:  Diagnosis Date  . 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 Abnormal pigmentation of skin 2004   L ear antihelix - bx consistent with SK Sharlett Iles), recheck stable per Dr. Kellie Moor  . Constipation    severe requiring laxatives, failed miralax  . Enlarged thyroid    h/o abnl labs but never treated  . Family history of breast cancer in first degree relative   . History of colon polyps    hyperplastic polyp 2008, 2013 hemorrhoids, rec rpt 5 yrs  . History of herpes genitalis    valtrex prn  . HLD (hyperlipidemia)   . HTN (hypertension)   . IBS (irritable bowel syndrome)   . Reflux esophagitis    PPI  . Sickle cell trait ([place])   . 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 Thyroid nodule 06/2011   R nodule, FNA biopsy = benign follicular, stable [place] (00/8676 and 12/2012)    Patient Active Problem List   Diagnosis Date Noted  . Mass of foot, right 12/02/2018  . Cervical stenosis (uterine cervix) 01/09/2018  . Adjustment disorder with mixed anxiety and depressed mood 10/28/2015  . Insomnia 12/31/2014  . Pain of left breast 10/02/2013  . Left hand paresthesia 06/11/2013  . Reflux esophagitis   . Subclinical hyperthyroidism 12/22/2012  . HLD (hyperlipidemia) 12/05/2012  . Healthcare maintenance 12/17/2011  . History of herpes genitalis   . Multinodular goiter 07/02/2011  . [place] lesion  05/16/2011  . Rectal pressure 05/16/2011  . HTN (hypertension)   . History of colon polyps   . Abnormal large bowel motility     Past Surgical History:  Procedure Laterality Date  . COLONOSCOPY  07/2011   hemorrhoids, rpt due 5 yrs given h/o polyps  . COLONOSCOPY WITH PROPOFOL N/A 05/29/2016   TA, rpt 5 yrs Jonathon Bellows, MD)  . thyroid US  12/2011   1cm solid nodule lower R pole (stable), no significant nodule on left side, rec rpt [place] in 1 yr  . TVS  11/2008   multiple uterine fibroids    OB History   No obstetric history on file.      Home Medications    Prior to Admission medications   Medication Sig Start Date End Date Taking? Authorizing Provider  chlorpheniramine-HYDROcodone (TUSSIONEX PENNKINETIC ER) 10-8 MG/5ML SUER Take 5 mLs by mouth every 12 (twelve) hours as needed for cough. 04/25/20  Yes Lamptey, Myrene Galas, MD  atenolol (TENORMIN) 50 MG tablet Take 1 tablet (50 mg total) by mouth 2 (two) times daily. 01/20/20   Ria Bush, MD  diphenhydrAMINE (BENADRYL) 25 MG tablet Take 25 mg by mouth at bedtime as needed.    [provider]  GARLIC PO Take by mouth.    [provider]  Glucosamine HCl (GLUCOSAMINE PO) Take by mouth daily.    [provider]  magnesium  30 MG tablet Take 30 mg by mouth 2 (two) times daily.    [provider]  Multiple Vitamin (MULTIVITAMIN) tablet Take 1 tablet by mouth daily.    [provider]  omeprazole (PRILOSEC) 20 MG capsule TAKE 1 CAPSULE BY MOUTH EVERY DAY 03/09/20   Ria Bush, MD  Red Yeast Rice 600 MG CAPS Take 1 capsule (600 mg total) by mouth 2 (two) times daily. 01/09/18   Ria Bush, MD  TURMERIC PO Take by mouth.    [provider]  valACYclovir (VALTREX) 500 MG tablet TAKE 1 TABLET (500 MG TOTAL) BY MOUTH 2 (TWO) TIMES DAILY. FOR 3 DAYS 02/01/20   Ria Bush, MD    Family History Family History  Problem Relation Age of Onset  . Heart disease Mother    . Deep vein thrombosis Mother   . Dementia Mother 26  . Heart disease Father        pig valve  . Coronary artery disease Father   . Cancer Father        prostate  . Cancer Maternal Grandmother        colon  . Stroke Maternal Grandfather   . Diabetes Sister   . Mental illness Sister        schizophrenia  . Cancer Sister 72       breast  . Breast cancer Sister 46  . Cancer Brother        appendix tumor, prostate  . Diabetes Brother   . Mental illness Brother        bipolar  . Cancer Maternal Aunt        bone  . Cancer Maternal Uncle        bladder  . Coronary artery disease Paternal Grandfather   . Cancer Cousin        ovarian, bone  . Breast cancer Cousin 66  . Cancer Cousin        breast  . Breast cancer Other 39       neice    Social History Social History   Tobacco Use  . Smoking status: Never Smoker  . Smokeless tobacco: Never Used  Substance Use Topics  . Alcohol use: Yes    Alcohol/week: 1.0 standard drink    Types: 1 Glasses of wine per week    Comment: occasional 2 xyearly  . Drug use: No     Allergies   Neosporin [neomycin-bacitracin zn-polymyx]   Review of Systems Review of Systems  Constitutional: Negative.   Eyes: Negative.   Respiratory: Positive for cough. Negative for shortness of breath and wheezing.   Cardiovascular: Negative for chest pain.  Gastrointestinal: Negative.      Physical Exam Triage Vital Signs ED Triage Vitals  Enc Vitals Group     BP 04/25/20 1723 (!) 153/76     Pulse Rate 04/25/20 1723 88     Resp 04/25/20 1723 17     Temp 04/25/20 1723 98.2 F (36.8 C)     Temp Source 04/25/20 1723 Oral     SpO2 04/25/20 1723 96 %     Weight --      Height --      Head Circumference --      Peak Flow --      Pain Score 04/25/20 1722 0     Pain Loc --      Pain Edu? --      Excl. in [place]? --    No data found.  Updated  Vital Signs BP (!) 153/76 (BP Location: Right Arm)   Pulse 88   Temp 98.2 F (36.8 C) (Oral)    Resp 17   SpO2 96%   Visual Acuity Right Eye Distance:   Left Eye Distance:   Bilateral Distance:    Right Eye Near:   Left Eye Near:    Bilateral Near:     Physical Exam Vitals and nursing note reviewed.  HENT:     Nose: No rhinorrhea.     Mouth/Throat:     Pharynx: No posterior oropharyngeal erythema.  Cardiovascular:     Rate and Rhythm: Normal rate and regular rhythm.  Pulmonary:     Effort: Pulmonary effort is normal.     Breath sounds: Normal breath sounds.      UC Treatments / Results  Labs (all labs ordered are listed, but only abnormal results are displayed) Labs Reviewed  SARS CORONAVIRUS 2 (TAT 6-24 HRS)    EKG   Radiology No results found.  Procedures Procedures (including critical care time)  Medications Ordered in UC Medications - No data to display  Initial Impression / Assessment and Plan / UC Course  I have reviewed the triage vital signs and the nursing notes.  Pertinent labs & imaging results that were available during my care of the patient were reviewed by me and considered in my medical decision making (see chart for details).     1.  Cough: COVID-19 PCR test sent Tussionex ER prescribed (patient has tried [employer] in the past with no improvement in coughing). He is quarantine until COVID-19 test results are available Return to urgent care if symptoms worsen.  We will call you with lab results if abnormal. Final Clinical Impressions(s) / UC Diagnoses   Final diagnoses:  Cough   Discharge Instructions   None    ED Prescriptions    Medication Sig Dispense Auth. Provider   chlorpheniramine-HYDROcodone (TUSSIONEX PENNKINETIC ER) 10-8 MG/5ML SUER Take 5 mLs by mouth every 12 (twelve) hours as needed for cough. 140 mL Lamptey, Myrene Galas, MD     PDMP not reviewed this encounter.   Chase Picket, MD 04/25/20 [phone]

## 2020-04-25 NOTE — ED Triage Notes (Signed)
Pt presents with non productive cough since yesterday. 

## 2020-04-25 NOTE — Telephone Encounter (Signed)
Patient called stating that she has a cold and would like Dr. Danise Mina to call her a cough medication in. Patient stated that she has had a cold like this off and on for years. Patient stated that she started yesterday with head congestion, cough and a little achy. Patient was advised that she will at least need a virtual visit. Patient scheduled for an appointment with Dr. Glori Bickers Wednesday morning because there are not any appointments available here at the office tomorrow 04/26/20. After talking more with the patient she admitted that she is wheezing and has some SOB. Patient stated that she just had a friend that passed away from covid and she [place]. Patient stated that she has had two of the covid vaccines but has not gotten the booster yet. Patient was advised that she should not wait until Wednesday for a virtual visit and she should have a face to face so that someone can listen to her lungs. Patient stated that she will get dressed and head over to the UC next to [facility] hospital to be seen.  Appointment cancelled with Dr. Glori Bickers 04/27/20 because patient is going to [facility].

## 2020-04-26 LAB — SARS CORONAVIRUS 2 (TAT 6-24 HRS): SARS Coronavirus 2: POSITIVE — AB

## 2020-04-27 ENCOUNTER — Telehealth: Payer: Self-pay

## 2020-04-27 ENCOUNTER — Telehealth: Payer: Federal, State, Local not specified - PPO | Admitting: Family Medicine

## 2020-04-27 NOTE — Telephone Encounter (Signed)
Called to discuss with patient about COVID-19 symptoms and the use of one of the available treatments for those with mild to moderate Covid symptoms and at a high risk of hospitalization.  Pt appears to qualify for outpatient treatment due to co-morbid conditions and/or a member of an at-risk group in accordance with the FDA Emergency Use Authorization.    Symptom onset: 04/24/20 per chart Vaccinated: Yes Booster? Yes Immunocompromised? No Qualifiers: HTN  Unable to reach pt - Left message and call back number [phone].   Kendra Jackson

## 2020-07-20 ENCOUNTER — Ambulatory Visit (INDEPENDENT_AMBULATORY_CARE_PROVIDER_SITE_OTHER)
Admission: RE | Admit: 2020-07-20 | Discharge: 2020-07-20 | Disposition: A | Discharge status: Home / Self Care | Payer: Federal, State, Local not specified - PPO | Source: Ambulatory Visit | Attending: Internal Medicine | Admitting: Internal Medicine

## 2020-07-20 ENCOUNTER — Ambulatory Visit: Payer: Federal, State, Local not specified - PPO | Admitting: Internal Medicine

## 2020-07-20 ENCOUNTER — Other Ambulatory Visit: Payer: Self-pay

## 2020-07-20 ENCOUNTER — Encounter: Payer: Self-pay | Admitting: Internal Medicine

## 2020-07-20 VITALS — BP 124/72 | HR 71 | Temp 97.6°F | Ht 66.5 in | Wt 179.0 lb

## 2020-07-20 DIAGNOSIS — R2242 Localized swelling, mass and lump, left lower limb: Secondary | ICD-10-CM | POA: Diagnosis not present

## 2020-07-20 NOTE — Assessment & Plan Note (Addendum)
This could be contusion/hematoma--but I can't be sure that it is not in the tibia. No tenderness or inflammation Strong FH of bone cancer Will check CXR  X-ray shows no bony abnormality Very reassuring No action needed

## 2020-07-20 NOTE — Progress Notes (Signed)
Subjective:    Patient ID: Kendra Jackson, female    DOB: Oct 30, 1960, 60 y.o.   MRN: [id number]  HPI Here due to a knot on her left leg This visit occurred during the SARS-CoV-2 public health emergency.  Safety protocols were in place, including screening questions prior to the visit, additional usage of staff PPE, and extensive cleaning of exam room while observing appropriate contact time as indicated for disinfecting solutions.   May have hit left calf--but forgot about it---?2 weeks ago Noticed the lump about a week ago Has strong history of bone cancer in family--so she was concerned about this  No pain No recent change in size  Current Outpatient Medications on File Prior to Visit  Medication Sig Dispense Refill  . atenolol (TENORMIN) 50 MG tablet Take 1 tablet (50 mg total) by mouth 2 (two) times daily. 180 tablet 3  . loratadine (CLARITIN) 10 MG tablet Take 10 mg by mouth daily.    . Multiple Vitamin (MULTIVITAMIN) tablet Take 1 tablet by mouth daily.    Marland Kitchen omeprazole (PRILOSEC) 20 MG capsule TAKE 1 CAPSULE BY MOUTH EVERY DAY 90 capsule 3  . Red Yeast Rice 600 MG CAPS Take 1 capsule (600 mg total) by mouth 2 (two) times daily.     No current facility-administered medications on file prior to visit.    Allergies  Allergen Reactions  . Neosporin [Neomycin-Bacitracin Zn-Polymyx] Rash    Past Medical History:  Diagnosis Date  . 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 Abnormal pigmentation of skin 2004   L ear antihelix - bx consistent with SK Sharlett Iles), recheck stable per Dr. Kellie Moor  . Constipation    severe requiring laxatives, failed miralax  . Enlarged thyroid    h/o abnl labs but never treated  . Family history of breast cancer in first degree relative   . History of colon polyps    hyperplastic polyp 2008, 2013 hemorrhoids, rec rpt 5 yrs  . History of herpes genitalis    valtrex prn  . HLD (hyperlipidemia)   . HTN  (hypertension)   . IBS (irritable bowel syndrome)   . Reflux esophagitis    PPI  . Sickle cell trait ([place])   . 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 Thyroid nodule 06/2011   R nodule, FNA biopsy = benign follicular, stable [place] (67/8938 and 12/2012)    Past Surgical History:  Procedure Laterality Date  . COLONOSCOPY  07/2011   hemorrhoids, rpt due 5 yrs given h/o polyps  . COLONOSCOPY WITH PROPOFOL N/A 05/29/2016   TA, rpt 5 yrs Jonathon Bellows, MD)  . thyroid US  12/2011   1cm solid nodule lower R pole (stable), no significant nodule on left side, rec rpt [place] in 1 yr  . TVS  11/2008   multiple uterine fibroids    Family History  Problem Relation Age of Onset  . Heart disease Mother   . Deep vein thrombosis Mother   . Dementia Mother 28  . Heart disease Father        pig valve  . Coronary artery disease Father   . Cancer Father        prostate  . Cancer Maternal Grandmother        colon  . Stroke Maternal Grandfather   . Diabetes Sister   . Mental illness Sister        schizophrenia  . Cancer Sister 68  breast  . Breast cancer Sister 98  . Cancer Brother        appendix tumor, prostate  . Diabetes Brother   . Mental illness Brother        bipolar  . Cancer Maternal Aunt        bone  . Cancer Maternal Uncle        bladder  . Coronary artery disease Paternal Grandfather   . Cancer Cousin        ovarian, bone  . Breast cancer Cousin 53  . Cancer Cousin        breast  . Breast cancer Other 18       neice    Social History   Socioeconomic History  . Marital status: Married    Spouse name: Not on file  . Number of children: 1  . Years of education: Not on file  . Highest education level: Not on file  Occupational History  . Occupation: Production manager: [employer]  Tobacco Use  . Smoking status: Never Smoker  . Smokeless tobacco: Never Used  Substance and Sexual Activity  . Alcohol use: Yes    Alcohol/week: 1.0  standard drink    Types: 1 Glasses of wine per week    Comment: occasional 2 xyearly  . Drug use: No  . Sexual activity: Not on file  Other Topics Concern  . Not on file  Social History Narrative   Caffeine: rare   Lives alone, no pets. Grown son 66yo, grandson died suddenly at 1yo 2010 (drowning) - lots of stress from this.   Occupation: Tour manager, works third shift   Activity: no regular exercise but bought treadmill   Diet: good water, fruits daily, vegetables occasionally   Social Determinants of Radio broadcast assistant Strain: Not on file  Food Insecurity: Not on file  Transportation Needs: Not on file  Physical Activity: Not on file  Stress: Not on file  Social Connections: Not on file  Intimate Partner Violence: Not on file   Review of Systems  No fevers Occasional sweats--when hot Some change in bowels--more regular lately (hasn't needed laxative of late)     Objective:   Physical Exam Musculoskeletal:     Comments: ~53mm firm mass on left tibia----1/3rd down from knee Has seborrheic keratosis under this            Assessment & Plan:

## 2020-10-07 ENCOUNTER — Other Ambulatory Visit: Payer: Self-pay | Admitting: Family Medicine

## 2020-10-07 NOTE — Telephone Encounter (Signed)
Last office visit 07/20/2020 for mass of left lower leg with Dr. Silvio Pate.  Valtrex is not on current medication list.  Note on refill states: The original prescription was discontinued on 07/20/2020 by Venia Carbon, MD for the following reason: Completed Course.   No future appointments.

## 2020-10-22 ENCOUNTER — Other Ambulatory Visit: Payer: Self-pay | Admitting: Family Medicine

## 2020-11-07 ENCOUNTER — Other Ambulatory Visit: Payer: Self-pay | Admitting: Family Medicine

## 2020-11-07 NOTE — Telephone Encounter (Signed)
Refilled on 10/24/20 for 30 days supply at a time, request from pharmacy for 90 day supply

## 2020-11-09 NOTE — Telephone Encounter (Signed)
She is taking BID x3d per flare.  Refilling 90 days (180 pills) is not appropriate.

## 2021-02-03 ENCOUNTER — Encounter: Payer: Self-pay | Admitting: Family Medicine

## 2021-02-03 ENCOUNTER — Other Ambulatory Visit (HOSPITAL_COMMUNITY)
Admission: RE | Admit: 2021-02-03 | Discharge: 2021-02-03 | Disposition: A | Discharge status: Home / Self Care | Payer: Federal, State, Local not specified - PPO | Source: Ambulatory Visit | Attending: Family Medicine | Admitting: Family Medicine

## 2021-02-03 ENCOUNTER — Ambulatory Visit (INDEPENDENT_AMBULATORY_CARE_PROVIDER_SITE_OTHER): Payer: Federal, State, Local not specified - PPO | Admitting: Family Medicine

## 2021-02-03 ENCOUNTER — Other Ambulatory Visit: Payer: Self-pay

## 2021-02-03 VITALS — BP 134/86 | HR 71 | Temp 97.5°F | Ht 66.5 in | Wt 179.1 lb

## 2021-02-03 DIAGNOSIS — E0590 Thyrotoxicosis, unspecified without thyrotoxic crisis or storm: Secondary | ICD-10-CM | POA: Diagnosis not present

## 2021-02-03 DIAGNOSIS — Z0000 Encounter for general adult medical examination without abnormal findings: Secondary | ICD-10-CM | POA: Diagnosis not present

## 2021-02-03 DIAGNOSIS — I10 Essential (primary) hypertension: Secondary | ICD-10-CM | POA: Diagnosis not present

## 2021-02-03 DIAGNOSIS — E785 Hyperlipidemia, unspecified: Secondary | ICD-10-CM

## 2021-02-03 DIAGNOSIS — E042 Nontoxic multinodular goiter: Secondary | ICD-10-CM

## 2021-02-03 DIAGNOSIS — K2100 Gastro-esophageal reflux disease with esophagitis, without bleeding: Secondary | ICD-10-CM

## 2021-02-03 DIAGNOSIS — N644 Mastodynia: Secondary | ICD-10-CM

## 2021-02-03 DIAGNOSIS — Z01419 Encounter for gynecological examination (general) (routine) without abnormal findings: Secondary | ICD-10-CM

## 2021-02-03 DIAGNOSIS — N882 Stricture and stenosis of cervix uteri: Secondary | ICD-10-CM

## 2021-02-03 DIAGNOSIS — Z23 Encounter for immunization: Secondary | ICD-10-CM | POA: Diagnosis not present

## 2021-02-03 NOTE — Assessment & Plan Note (Signed)
Preventative protocols reviewed and updated unless pt declined. Discussed healthy diet and lifestyle.  

## 2021-02-03 NOTE — Assessment & Plan Note (Signed)
Update TFTs.  Advised she let us know who she'd like to see for endo - last saw Dr Cruzita Lederer 2018

## 2021-02-03 NOTE — Assessment & Plan Note (Signed)
Continue PRN omeprazole 20mg  

## 2021-02-03 NOTE — Assessment & Plan Note (Addendum)
Chronic, ongoing. Normal diagnostic mammo/US 2018 and again 2020. Upcoming rpt screening mammo.

## 2021-02-03 NOTE — Assessment & Plan Note (Signed)
Chronic issue. Unable to take endocervical sample.

## 2021-02-03 NOTE — Patient Instructions (Addendum)
Flu shot today  Labs today  Let me know who your son sees for endocrinology and if you'd like referral - as you're due for follow up on thyroid.  You will be due for colonoscopy 05/2021 (Dr Vicente Males).  Pap smear today  Call to schedule mammogram.  Return as needed or in 1 year for next physical.   Health Maintenance for Postmenopausal Women Menopause is a normal process in which your ability to get pregnant comes to an end. This process happens slowly over many months or years, usually between the ages of 108 and 4. Menopause is complete when you have missed your menstrual period for 12 months. It is important to talk with your health care provider about some of the most common conditions that affect women after menopause (postmenopausal women). These include heart disease, cancer, and bone loss (osteoporosis). Adopting a healthy lifestyle and getting preventive care can help to promote your health and wellness. The actions you take can also lower your chances of developing some of these common conditions. What are the signs and symptoms of menopause? During menopause, you may have the following symptoms: Hot flashes. These can be moderate or severe. Night sweats. Decrease in sex drive. Mood swings. Headaches. Tiredness (fatigue). Irritability. Memory problems. Problems falling asleep or staying asleep. Talk with your health care provider about treatment options for your symptoms. Do I need hormone replacement therapy? Hormone replacement therapy is effective in treating symptoms that are caused by menopause, such as hot flashes and night sweats. Hormone replacement carries certain risks, especially as you become older. If you are thinking about using estrogen or estrogen with progestin, discuss the benefits and risks with your health care provider. How can I reduce my risk for heart disease and stroke? The risk of heart disease, heart attack, and stroke increases as you age. One of the causes  may be a change in the body's hormones during menopause. This can affect how your body uses dietary fats, triglycerides, and cholesterol. Heart attack and stroke are medical emergencies. There are many things that you can do to help prevent heart disease and stroke. Watch your blood pressure High blood pressure causes heart disease and increases the risk of stroke. This is more likely to develop in people who have high blood pressure readings or are overweight. Have your blood pressure checked: Every 3-5 years if you are 41-30 years of age. Every year if you are 66 years old or older. Eat a healthy diet  Eat a diet that includes plenty of vegetables, fruits, low-fat dairy products, and lean protein. Do not eat a lot of foods that are high in solid fats, added sugars, or sodium. Get regular exercise Get regular exercise. This is one of the most important things you can do for your health. Most adults should: Try to exercise for at least 150 minutes each week. The exercise should increase your heart rate and make you sweat (moderate-intensity exercise). Try to do strengthening exercises at least twice each week. Do these in addition to the moderate-intensity exercise. Spend less time sitting. Even light physical activity can be beneficial. Other tips Work with your health care provider to achieve or maintain a healthy weight. Do not use any products that contain nicotine or tobacco. These products include cigarettes, chewing tobacco, and vaping devices, such as e-cigarettes. If you need help quitting, ask your health care provider. Know your numbers. Ask your health care provider to check your cholesterol and your blood sugar (glucose). Continue to  have your blood tested as directed by your health care provider. Do I need screening for cancer? Depending on your health history and family history, you may need to have cancer screenings at different stages of your life. This may include screening  for: Breast cancer. Cervical cancer. Lung cancer. Colorectal cancer. What is my risk for osteoporosis? After menopause, you may be at increased risk for osteoporosis. Osteoporosis is a condition in which bone destruction happens more quickly than new bone creation. To help prevent osteoporosis or the bone fractures that can happen because of osteoporosis, you may take the following actions: If you are 31-52 years old, get at least 1,000 mg of calcium and at least 600 international units (IU) of vitamin D per day. If you are older than age 73 but younger than age 81, get at least 1,200 mg of calcium and at least 600 international units (IU) of vitamin D per day. If you are older than age 102, get at least 1,200 mg of calcium and at least 800 international units (IU) of vitamin D per day. Smoking and drinking excessive alcohol increase the risk of osteoporosis. Eat foods that are rich in calcium and vitamin D, and do weight-bearing exercises several times each week as directed by your health care provider. How does menopause affect my mental health? Depression may occur at any age, but it is more common as you become older. Common symptoms of depression include: Feeling depressed. Changes in sleep patterns. Changes in appetite or eating patterns. Feeling an overall lack of motivation or enjoyment of activities that you previously enjoyed. Frequent crying spells. Talk with your health care provider if you think that you are experiencing any of these symptoms. General instructions See your health care provider for regular wellness exams and vaccines. This may include: Scheduling regular health, dental, and eye exams. Getting and maintaining your vaccines. These include: Influenza vaccine. Get this vaccine each year before the flu season begins. Pneumonia vaccine. Shingles vaccine. Tetanus, diphtheria, and pertussis (Tdap) booster vaccine. Your health care provider may also recommend other  immunizations. Tell your health care provider if you have ever been abused or do not feel safe at home. Summary Menopause is a normal process in which your ability to get pregnant comes to an end. This condition causes hot flashes, night sweats, decreased interest in sex, mood swings, headaches, or lack of sleep. Treatment for this condition may include hormone replacement therapy. Take actions to keep yourself healthy, including exercising regularly, eating a healthy diet, watching your weight, and checking your blood pressure and blood sugar levels. Get screened for cancer and depression. Make sure that you are up to date with all your vaccines. This information is not intended to replace advice given to you by your health care provider. Make sure you discuss any questions you have with your health care provider. Document Revised: 08/01/2020 Document Reviewed: 08/01/2020 Elsevier Patient Education  [place].

## 2021-02-03 NOTE — Assessment & Plan Note (Signed)
Due for endo f/u - she will let me know who she'd like to see

## 2021-02-03 NOTE — Assessment & Plan Note (Signed)
Update levels on RYR BID dosing.  The 10-year ASCVD risk score (Arnett DK, et al., 2019) is: 9.3%   Values used to calculate the score:     Age: 60 years     Sex: Female     Is Non-Hispanic African American: Yes     Diabetic: No     Tobacco smoker: No     Systolic Blood Pressure: 753 mmHg     Is BP treated: Yes     HDL Cholesterol: 72.2 mg/dL     Total Cholesterol: 246 mg/dL

## 2021-02-03 NOTE — Assessment & Plan Note (Signed)
Chronic, stable on atenolol - continue. Update microalb today

## 2021-02-03 NOTE — Progress Notes (Signed)
Patient ID: Kendra Jackson, female    DOB: [birth year], 60 y.o.   MRN: [id number]  This visit was conducted in person.  BP 134/86   Pulse 71   Temp (!) 97.5 F (36.4 C) (Temporal)   Ht 5' 6.5" (1.689 m)   Wt 179 lb 2 oz (81.3 kg)   SpO2 97%   BMI 28.48 kg/m    CC: CPE Subjective:   HPI: Kendra Jackson is a 60 y.o. female presenting on 02/03/2021 for Annual Exam   Preventative: COLONOSCOPY 05/2016 - TA, rpt 5 yrs Kendra Jackson). She remains concerned due to blood in stool - likely from known internal hemorrhoids.  Well woman - remote h/o abnormal cells s/p cryotherapy. pap smear 12/2017 WNL with cervical stenosis (absent endocervical zone), rpt 3 yrs  LMP - 04/12/2009  Mammogram - 02/2020 - Birads1 @ [facility]. Dense breasts.  Lung cancer screening - not eligible  Flu shot yearly  [place] 06/2019, 07/2019, booster 06/2020, bivalent 12/2020 Tdap 2013  Shingrix 12/2018, 03/2019 Seat belt use discussed  Sunscreen use discussed. No changing moles on skin Non smoker  Alcohol - none  Dentist yearly Eye exam yearly - due    Caffeine: rare   Lives alone, no pets. Grown son 28yo, grandson died suddenly at 1yo 2010 (drowning) - lots of stress from this.   Occupation: Tour manager, works third shift - retired 2018!  Activity: no regular exercise despite having treadmill  Diet: good water, fruits daily, vegetables occasionally       Relevant past medical, surgical, family and social history reviewed and updated as indicated. Interim medical history since our last visit reviewed. Allergies and medications reviewed and updated. Outpatient Medications Prior to Visit  Medication Sig Dispense Refill   atenolol (TENORMIN) 50 MG tablet Take 1 tablet (50 mg total) by mouth 2 (two) times daily. 180 tablet 3   loratadine (CLARITIN) 10 MG tablet Take 10 mg by mouth daily.     Multiple Vitamin (MULTIVITAMIN) tablet Take 1 tablet by mouth daily.     omeprazole (PRILOSEC) 20 MG capsule TAKE  1 CAPSULE BY MOUTH EVERY DAY (Patient taking differently: As needed) 90 capsule 3   Red Yeast Rice 600 MG CAPS Take 1 capsule (600 mg total) by mouth 2 (two) times daily.     valACYclovir (VALTREX) 500 MG tablet TAKE 1 TABLET (500 MG TOTAL) BY MOUTH 2 (TWO) TIMES DAILY. FOR 3 DAYS 30 tablet 1   No facility-administered medications prior to visit.     Per HPI unless specifically indicated in ROS section below Review of Systems  Constitutional:  Negative for activity change, appetite change, chills, fatigue, fever and unexpected weight change.  HENT:  Negative for hearing loss.   Eyes:  Negative for visual disturbance.  Respiratory:  Negative for cough, chest tightness, shortness of breath and wheezing.   Cardiovascular:  Negative for chest pain, palpitations and leg swelling.  Gastrointestinal:  Negative for abdominal distention, abdominal pain, blood in stool, constipation, diarrhea, nausea and vomiting.  Genitourinary:  Negative for difficulty urinating and hematuria.  Musculoskeletal:  Negative for arthralgias, myalgias and neck pain.  Skin:  Negative for rash.  Neurological:  Negative for dizziness, seizures, syncope and headaches.  Hematological:  Negative for adenopathy. Does not bruise/bleed easily.  Psychiatric/Behavioral:  Negative for dysphoric mood. The patient is not nervous/anxious.    Objective:  BP 134/86   Pulse 71   Temp (!) 97.5 F (36.4 C) (Temporal)   Ht 5'  6.5" (1.689 m)   Wt 179 lb 2 oz (81.3 kg)   SpO2 97%   BMI 28.48 kg/m   Wt Readings from Last 3 Encounters:  02/03/21 179 lb 2 oz (81.3 kg)  07/20/20 179 lb (81.2 kg)  01/20/20 177 lb 9 oz (80.5 kg)      Physical Exam Vitals and nursing note reviewed. Exam conducted with a chaperone present.  Constitutional:      Appearance: Normal appearance. She is not ill-appearing.  HENT:     Head: Normocephalic and atraumatic.     Right Ear: Tympanic membrane, ear canal and external ear normal. There is no  impacted cerumen.     Left Ear: Tympanic membrane, ear canal and external ear normal. There is no impacted cerumen.  Eyes:     General:        Right eye: No discharge.        Left eye: No discharge.     Extraocular Movements: Extraocular movements intact.     Conjunctiva/sclera: Conjunctivae normal.     Pupils: Pupils are equal, round, and reactive to light.  Neck:     Thyroid: No thyroid mass or thyromegaly.  Cardiovascular:     Rate and Rhythm: Normal rate and regular rhythm.     Pulses: Normal pulses.     Heart sounds: Normal heart sounds. No murmur heard. Pulmonary:     Effort: Pulmonary effort is normal. No respiratory distress.     Breath sounds: Normal breath sounds. No wheezing, rhonchi or rales.  Chest:  Breasts:    Right: Inverted nipple present. No swelling, bleeding, mass, nipple discharge, skin change or tenderness.     Left: Tenderness (chronic medial breast discomfort) present. No swelling, bleeding, inverted nipple, mass, nipple discharge or skin change.  Abdominal:     General: Bowel sounds are normal. There is no distension.     Palpations: Abdomen is soft. There is no mass.     Tenderness: There is no abdominal tenderness. There is no guarding or rebound.     Hernia: No hernia is present.  Genitourinary:    General: Normal vulva.     Exam position: Supine.     Pubic Area: No rash.      Labia:        Right: No rash or tenderness.        Left: No rash or tenderness.      Urethra: No prolapse.     Vagina: Normal.     Cervix: Normal.     Uterus: Normal.      Adnexa: Right adnexa normal and left adnexa normal.     Comments: Pap performed on cervix, stenosis noted Musculoskeletal:     Cervical back: Normal range of motion and neck supple. No rigidity.     Right lower leg: No edema.     Left lower leg: No edema.  Lymphadenopathy:     Cervical: No cervical adenopathy.     Upper Body:     Right upper body: No supraclavicular, axillary or pectoral adenopathy.      Left upper body: No supraclavicular, axillary or pectoral adenopathy.  Skin:    General: Skin is warm and dry.     Findings: No rash.  Neurological:     General: No focal deficit present.     Mental Status: She is alert. Mental status is at baseline.  Psychiatric:        Mood and Affect: Mood normal.  Behavior: Behavior normal.      Results for orders placed or performed during the hospital encounter of 04/25/20  SARS CORONAVIRUS 2 (TAT 6-24 HRS) Nasopharyngeal Nasopharyngeal Swab   Specimen: Nasopharyngeal Swab  Result Value Ref Range   SARS Coronavirus 2 POSITIVE (A) NEGATIVE    Assessment & Plan:  This visit occurred during the SARS-CoV-2 public health emergency.  Safety protocols were in place, including screening questions prior to the visit, additional usage of staff PPE, and extensive cleaning of exam room while observing appropriate contact time as indicated for disinfecting solutions.   Problem List Items Addressed This Visit     Healthcare maintenance - Primary (Chronic)    Preventative protocols reviewed and updated unless pt declined. Discussed healthy diet and lifestyle.       HTN (hypertension)    Chronic, stable on atenolol - continue. Update microalb today       Relevant Orders   Microalbumin / creatinine urine ratio   Multinodular goiter    Due for endo f/u - she will let me know who she'd like to see       HLD (hyperlipidemia)    Update levels on RYR BID dosing.  The 10-year ASCVD risk score (Arnett DK, et al., 2019) is: 9.3%   Values used to calculate the score:     Age: 62 years     Sex: Female     Is Non-Hispanic African American: Yes     Diabetic: No     Tobacco smoker: No     Systolic Blood Pressure: 614 mmHg     Is BP treated: Yes     HDL Cholesterol: 72.2 mg/dL     Total Cholesterol: 246 mg/dL       Relevant Orders   Comprehensive metabolic panel   Lipid panel   Subclinical hyperthyroidism    Update TFTs.  Advised she let  us know who she'd like to see for endo - last saw Dr Cruzita Lederer 2018      Relevant Orders   CBC with Differential/Platelet   T3   T4, free   TSH   Reflux esophagitis    Continue PRN omeprazole 20mg       Pain of left breast    Chronic, ongoing. Normal diagnostic mammo/US 2018 and again 2020. Upcoming rpt screening mammo.       Cervical stenosis (uterine cervix)    Chronic issue. Unable to take endocervical sample.       Other Visit Diagnoses     Need for influenza vaccination       Relevant Orders   Flu Vaccine QUAD 41mo+IM (Fluarix, Fluzone & Alfiuria Quad PF) (Completed)   Encounter for annual routine gynecological examination       Relevant Orders   Cytology - PAP        No orders of the defined types were placed in this encounter.  Orders Placed This Encounter  Procedures   Flu Vaccine QUAD 60mo+IM (Fluarix, Fluzone & Alfiuria Quad PF)   CBC with Differential/Platelet   Comprehensive metabolic panel   Lipid panel   Microalbumin / creatinine urine ratio   T3   T4, free   TSH    Patient instructions: Flu shot today  Labs today  Let me know who your son sees for endocrinology and if you'd like referral - as you're due for follow up on thyroid.  You will be due for colonoscopy 05/2021 (Dr Kendra Jackson).  Pap smear today  Call to schedule mammogram.  Return as needed or in 1 year for next physical.   Follow up plan: Return in about 1 year (around 02/03/2022), or if symptoms worsen or fail to improve, for annual exam, prior fasting for blood work.  Ria Bush, MD

## 2021-02-04 LAB — CBC WITH DIFFERENTIAL/PLATELET
Absolute Monocytes: 583 cells/uL (ref 200–950)
Basophils Absolute: 87 cells/uL (ref 0–200)
Basophils Relative: 1.3 %
Eosinophils Absolute: 121 cells/uL (ref 15–500)
Eosinophils Relative: 1.8 %
HCT: 40.6 % (ref 35.0–45.0)
Hemoglobin: 13.2 g/dL (ref 11.7–15.5)
Lymphs Abs: 2492 cells/uL (ref 850–3900)
MCH: 25.3 pg — ABNORMAL LOW (ref 27.0–33.0)
MCHC: 32.5 g/dL (ref 32.0–36.0)
MCV: 77.9 fL — ABNORMAL LOW (ref 80.0–100.0)
MPV: 11.2 fL (ref 7.5–12.5)
Monocytes Relative: 8.7 %
Neutro Abs: 3417 cells/uL (ref 1500–7800)
Neutrophils Relative %: 51 %
Platelets: 291 10*3/uL (ref 140–400)
RBC: 5.21 10*6/uL — ABNORMAL HIGH (ref 3.80–5.10)
RDW: 14.5 % (ref 11.0–15.0)
Total Lymphocyte: 37.2 %
WBC: 6.7 10*3/uL (ref 3.8–10.8)

## 2021-02-04 LAB — LIPID PANEL
Cholesterol: 269 mg/dL — ABNORMAL HIGH (ref <200)
HDL: 86 mg/dL (ref >=50)
LDL Cholesterol (Calc): 160 mg/dL (calc) — ABNORMAL HIGH
Non-HDL Cholesterol (Calc): 183 mg/dL (calc) — ABNORMAL HIGH (ref <130)
Total CHOL/HDL Ratio: 3.1 (calc) (ref <5.0)
Triglycerides: 110 mg/dL (ref <150)

## 2021-02-04 LAB — COMPREHENSIVE METABOLIC PANEL
AG Ratio: 1.6 (calc) (ref 1.0–2.5)
ALT: 20 U/L (ref 6–29)
AST: 19 U/L (ref 10–35)
Albumin: 4.6 g/dL (ref 3.6–5.1)
Alkaline phosphatase (APISO): 95 U/L (ref 37–153)
BUN: 15 mg/dL (ref 7–25)
CO2: 27 mmol/L (ref 20–32)
Calcium: 9.7 mg/dL (ref 8.6–10.4)
Chloride: 104 mmol/L (ref 98–110)
Creat: 0.89 mg/dL (ref 0.50–1.05)
Globulin: 2.9 g/dL (calc) (ref 1.9–3.7)
Glucose, Bld: 95 mg/dL (ref 65–99)
Potassium: 4.2 mmol/L (ref 3.5–5.3)
Sodium: 141 mmol/L (ref 135–146)
Total Bilirubin: 0.4 mg/dL (ref 0.2–1.2)
Total Protein: 7.5 g/dL (ref 6.1–8.1)

## 2021-02-04 LAB — TSH: TSH: 0.5 mIU/L (ref 0.40–4.50)

## 2021-02-04 LAB — MICROALBUMIN / CREATININE URINE RATIO
Creatinine, Urine: 151 mg/dL (ref 20–275)
Microalb Creat Ratio: 4 mcg/mg creat (ref <30)
Microalb, Ur: 0.6 mg/dL

## 2021-02-04 LAB — T4, FREE: Free T4: 1.1 ng/dL (ref 0.8–1.8)

## 2021-02-04 LAB — T3: T3, Total: 116 ng/dL (ref 76–181)

## 2021-02-06 ENCOUNTER — Other Ambulatory Visit: Payer: Self-pay | Admitting: Family Medicine

## 2021-02-06 ENCOUNTER — Telehealth: Payer: Self-pay | Admitting: Family Medicine

## 2021-02-06 DIAGNOSIS — N644 Mastodynia: Secondary | ICD-10-CM

## 2021-02-06 DIAGNOSIS — E0590 Thyrotoxicosis, unspecified without thyrotoxic crisis or storm: Secondary | ICD-10-CM

## 2021-02-06 NOTE — Telephone Encounter (Signed)
Referral placed back to Dr Cruzita Lederer.  I don't think she needs diagnostic mammo for chronic left breast pain that has been evaluated twice with detailed imaging including ultrasound. Recommend she just get screening mammo scheduled at [facility].

## 2021-02-06 NOTE — Telephone Encounter (Signed)
Mrs. Lilja called in and asked to speak to Lattie Haw about making appointments for her. She called her to scheduled her mammogram and they asked her if she is having issues with and she stated her left breast pain. And they told her to get an order for the mammogram. And its at [facility] and it needs to be after 12/8. And she stated that the doctor that checks on her thyroids to make an appointment for her.

## 2021-02-06 NOTE — Telephone Encounter (Signed)
Spoke with Kendra Jackson.  She needs order for L breast pain for diagnostic mammo at [facility].  Also, Kendra Jackson mentioned she will stay with Dr. Cruzita Lederer for her thyroid.

## 2021-02-06 NOTE — Addendum Note (Signed)
Addended by: Ria Bush on: 02/06/2021 05:03 PM   Modules accepted: Orders

## 2021-02-07 NOTE — Telephone Encounter (Signed)
Patient notified as instructed by telephone and verbalized understanding. Patient stated that she called to schedule her mammogram and when she told them that she was having left breast pain they told her that she would need to get her doctor to order a diagnostic mammogram. Patient told them that she has had this pain for years, but the lady still would not schedule it. Patient stated that she will call [facility] back and let them know what Dr. Darnell Level said and try to schedule a screening mammogram. Patient was advised to tell them to call our office if they have a problem scheduling it for her.

## 2021-02-08 NOTE — Telephone Encounter (Signed)
I've ordered this.

## 2021-02-08 NOTE — Addendum Note (Signed)
Addended by: Ria Bush on: 02/08/2021 01:50 PM   Modules accepted: Orders

## 2021-02-08 NOTE — Telephone Encounter (Signed)
Pt called stating she spoke to [facility] and they said that Dr Darnell Level HAS to send in a referral for her mammogram.

## 2021-02-09 LAB — CYTOLOGY - PAP
Comment: NEGATIVE
Diagnosis: NEGATIVE
High risk HPV: NEGATIVE

## 2021-02-09 NOTE — Telephone Encounter (Signed)
Left message on vm per dpr notifying Dr. Darnell Level placed order for diagnostic mammo.

## 2021-02-13 ENCOUNTER — Other Ambulatory Visit: Payer: Self-pay | Admitting: Family Medicine

## 2021-02-13 DIAGNOSIS — N644 Mastodynia: Secondary | ICD-10-CM

## 2021-02-13 DIAGNOSIS — R928 Other abnormal and inconclusive findings on diagnostic imaging of breast: Secondary | ICD-10-CM

## 2021-02-13 DIAGNOSIS — Z1231 Encounter for screening mammogram for malignant neoplasm of breast: Secondary | ICD-10-CM

## 2021-03-02 ENCOUNTER — Ambulatory Visit
Admission: RE | Admit: 2021-03-02 | Discharge: 2021-03-02 | Disposition: A | Discharge status: Home / Self Care | Payer: Federal, State, Local not specified - PPO | Source: Ambulatory Visit | Attending: Family Medicine | Admitting: Family Medicine

## 2021-03-02 ENCOUNTER — Other Ambulatory Visit: Payer: Self-pay

## 2021-03-02 DIAGNOSIS — N644 Mastodynia: Secondary | ICD-10-CM | POA: Insufficient documentation

## 2021-03-02 DIAGNOSIS — R922 Inconclusive mammogram: Secondary | ICD-10-CM | POA: Diagnosis not present

## 2021-03-02 DIAGNOSIS — Z1231 Encounter for screening mammogram for malignant neoplasm of breast: Secondary | ICD-10-CM

## 2021-03-02 DIAGNOSIS — R928 Other abnormal and inconclusive findings on diagnostic imaging of breast: Secondary | ICD-10-CM | POA: Insufficient documentation

## 2021-03-02 DIAGNOSIS — Z803 Family history of malignant neoplasm of breast: Secondary | ICD-10-CM | POA: Diagnosis not present

## 2021-03-03 ENCOUNTER — Telehealth: Payer: Self-pay | Admitting: Family Medicine

## 2021-03-03 NOTE — Telephone Encounter (Signed)
Spoke with pt.  (See Imaging Result Notes, 03/02/21).

## 2021-03-03 NOTE — Telephone Encounter (Signed)
Pt returned call Call back [phone]

## 2021-03-13 ENCOUNTER — Other Ambulatory Visit: Payer: Self-pay | Admitting: Family Medicine

## 2021-03-30 NOTE — Progress Notes (Signed)
Name: Kendra Jackson  MRN/ DOB: [id number], [birth year]    Age/ Sex: 61 y.o., female    PCP: Ria Bush, MD   Reason for Endocrinology Evaluation: MNG     Date of Initial Endocrinology Evaluation: 03/31/2021     HPI: Kendra Jackson is a 61 y.o. female with a past medical history of MNG. The patient presented for initial endocrinology clinic visit on 03/31/2021 for consultative assistance with her MNG  Pt with Hx of subclinical hyperthyroidism decades ago, she was noted with MNG based on [place] in 2013 She is S/P benign FNA of  the right thyroid nodule in 2013. Due to presence of microcalcification and slight increase in size a repeat FNA was recommended in 2017 with benign cytology     She has noted change in her neck but no swelling . 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 Kellie Moor   Constipation    severe requiring laxatives, failed miralax   Enlarged thyroid    h/o abnl labs but never treated   Family history of breast cancer in first degree relative    History of colon polyps    hyperplastic polyp 2008, 2013 hemorrhoids, rec rpt 5 yrs   History of herpes genitalis    valtrex prn   HLD (hyperlipidemia)    HTN (hypertension)    IBS (irritable bowel syndrome)    Reflux esophagitis    PPI   Sickle cell trait ([place])    Subclinical hyperthyroidism 12/22/2012   yearly visit with endo   Thyroid nodule 06/2011   R nodule, FNA biopsy = benign follicular, stable [place] (62/8315 and 12/2012)   Past  Surgical History:  Past Surgical History:  Procedure Laterality Date   COLONOSCOPY  07/2011   hemorrhoids, rpt due 5 yrs given h/o polyps   COLONOSCOPY WITH PROPOFOL N/A 05/29/2016   TA, rpt 5 yrs Jonathon Bellows, MD)   thyroid US  12/2011   1cm solid nodule lower R pole (stable), no significant nodule on left side, rec rpt [place] in 1 yr   TVS  11/2008   multiple uterine fibroids    Social History:  reports that she has never smoked. She has never used smokeless tobacco. She reports current alcohol use of about 1.0 standard drink per week. She reports that she does not use drugs. Family History: family history includes Breast cancer (age of onset: 42) in an other family member; Breast cancer (age of onset: 34) in her sister; Breast cancer (age of onset: 29) in her cousin; Cancer in her brother, cousin, cousin, father, maternal aunt, maternal grandmother, and maternal uncle; Cancer (age of onset: 16) in her sister; Coronary artery disease in her father and paternal grandfather; Deep vein thrombosis in her mother; Dementia (age of onset: 55) in her mother; Diabetes in her brother and sister; Heart disease in her father and mother; Mental illness in her brother and sister; Stroke in her maternal grandfather.   HOME  MEDICATIONS: Allergies as of 03/31/2021       Reactions   Neosporin [neomycin-bacitracin Zn-polymyx] Rash        Medication List        Accurate as of March 31, 2021 12:39 PM. If you have any questions, ask your nurse or doctor.          atenolol 50 MG tablet Commonly known as: TENORMIN TAKE 1 TABLET BY MOUTH TWICE A DAY   loratadine 10 MG tablet Commonly known as: CLARITIN Take 10 mg by mouth daily.   multivitamin tablet Take 1 tablet by mouth daily.   omeprazole 20 MG capsule Commonly known as: PRILOSEC TAKE 1 CAPSULE BY MOUTH EVERY DAY What changed:  how much to take how to take this when to take this additional instructions   Red Yeast Rice 600 MG Caps Take 1  capsule (600 mg total) by mouth 2 (two) times daily.   valACYclovir 500 MG tablet Commonly known as: VALTREX TAKE 1 TABLET (500 MG TOTAL) BY MOUTH 2 (TWO) TIMES DAILY. FOR 3 DAYS          REVIEW OF SYSTEMS: A comprehensive ROS was conducted with the patient and is negative except as per HPI    OBJECTIVE:  VS: BP (!) 160/94 (BP Location: Left Arm, Patient Position: Sitting, Cuff Size: Small)    Pulse 80    Ht 5' 6.5" (1.689 m)    Wt 184 lb (83.5 kg)    SpO2 98%    BMI 29.25 kg/m    Wt Readings from Last 3 Encounters:  03/31/21 184 lb (83.5 kg)  02/03/21 179 lb 2 oz (81.3 kg)  07/20/20 179 lb (81.2 kg)     EXAM: General: Pt appears well and is in NAD  Hydration: Well-hydrated with moist mucous membranes and good skin turgor  Eyes: External eye exam normal without stare, lid lag or exophthalmos.  EOM intact.  PERRL.  Neck: General: Supple without adenopathy. Thyroid: Thyroid size normal.  No goiter or nodules appreciated.  Lungs: Clear with good BS bilat with no rales, rhonchi, or wheezes  Heart: Auscultation: RRR.  Abdomen: Normoactive bowel sounds, soft, nontender, without masses or organomegaly palpable  Extremities:  BL LE: No pretibial edema normal ROM and strength.  Mental Status: Judgment, insight: Intact Orientation: Oriented to time, place, and person Mood and affect: No depression, anxiety, or agitation     DATA REVIEWED:   Latest Reference Range & Units 03/31/21 08:49  TSH 0.35 - 5.50 uIU/mL 0.67  T4,Free(Direct) 0.60 - 1.60 ng/dL 0.87    FNA of right thyroid nodule 06/2011   THYROID, FINE NEEDLE ASPIRATION, RIGHT: BENIGN. FINDINGS CONSISTENT WITH BENIGN FOLLICULAR NODULE.    FNA of the right lower nodule 08/2015  THYROID, FINE NEEDLE ASPIRATION, RLP (SPECIMEN 1 OF 1, COLLECTED ON 09/20/15): CONSISTENT WITH BENIGN FOLLICULAR NODULE (BETHESDA CATEGORY II).  ASSESSMENT/PLAN/RECOMMENDATIONS:   MNG:  - She is S/P benign FNA of the right nodule in  2013 and in 2017  -Patient has no local neck symptoms -She is clinically euthyroid -We will proceed with thyroid ultrasound, if thyroid nodule is stable no further serial ultrasounds will be necessary unless clinically indicated (such as dysphagia etc.)  Follow-up pending ultrasound results  Signed electronically by: Mack Guise, MD  [facility] End[facility]  [facility] [address] [place], [place] [address] Phone: [phone] FAX: [phone]   CC: Ria Bush, MD [place] [address] Phone: [phone]  Fax: [phone]   Return to Endocrinology clinic as below: Future Appointments  Date Time Provider [place]  01/29/2022  9:00 AM [facility]  02/06/2022  9:00 AM Ria Bush, MD [facility]

## 2021-03-31 ENCOUNTER — Encounter: Payer: Self-pay | Admitting: Internal Medicine

## 2021-03-31 ENCOUNTER — Other Ambulatory Visit: Payer: Self-pay

## 2021-03-31 ENCOUNTER — Ambulatory Visit (INDEPENDENT_AMBULATORY_CARE_PROVIDER_SITE_OTHER): Payer: Federal, State, Local not specified - PPO | Admitting: Internal Medicine

## 2021-03-31 VITALS — BP 160/94 | HR 80 | Ht 66.5 in | Wt 184.0 lb

## 2021-03-31 DIAGNOSIS — E042 Nontoxic multinodular goiter: Secondary | ICD-10-CM

## 2021-03-31 LAB — T4, FREE: Free T4: 0.87 ng/dL (ref 0.60–1.60)

## 2021-03-31 LAB — TSH: TSH: 0.67 u[IU]/mL (ref 0.35–5.50)

## 2021-04-13 ENCOUNTER — Ambulatory Visit
Admission: RE | Admit: 2021-04-13 | Discharge: 2021-04-13 | Disposition: A | Discharge status: Home / Self Care | Payer: Federal, State, Local not specified - PPO | Source: Ambulatory Visit | Attending: Internal Medicine | Admitting: Internal Medicine

## 2021-04-13 DIAGNOSIS — E042 Nontoxic multinodular goiter: Secondary | ICD-10-CM | POA: Diagnosis not present

## 2021-04-14 ENCOUNTER — Encounter: Payer: Self-pay | Admitting: Internal Medicine

## 2021-05-31 ENCOUNTER — Telehealth: Payer: Self-pay | Admitting: Family Medicine

## 2021-05-31 DIAGNOSIS — Z1211 Encounter for screening for malignant neoplasm of colon: Secondary | ICD-10-CM

## 2021-05-31 DIAGNOSIS — Z86010 Personal history of colonic polyps: Secondary | ICD-10-CM

## 2021-05-31 NOTE — Telephone Encounter (Signed)
Spoke to patient by telephone and gave her the name and telephone number of the GI doctor that did her last colonoscopy. Patient was advised that she should be able to reach out to her GI doctor and get her colonoscopy scheduled.  ?

## 2021-05-31 NOTE — Telephone Encounter (Signed)
Patient is requesting a call regarding her screening colonoscopy, she is due ? ?Please call the patient and let her know when it is ready to be scheduled. ?

## 2021-06-06 NOTE — Addendum Note (Signed)
Addended by: Ria Bush on: 06/06/2021 05:27 PM ? ? Modules accepted: Orders ? ?

## 2021-06-06 NOTE — Telephone Encounter (Addendum)
New referral placed.  ?Kendra Jackson, is this true? I though GI usually sends letter for pts to call and schedule colonoscopy when due.  ? ?COLONOSCOPY 05/2016 - TA, rpt 5 yrs Vicente Males).  ?

## 2021-06-06 NOTE — Telephone Encounter (Signed)
Pt came in office stated she contacted the GI doctor for her colonoscopy and was told she needs a referral . Wants to know would she need an appointment for the referral . Please advise [phone] ?

## 2021-06-07 ENCOUNTER — Other Ambulatory Visit: Payer: Self-pay

## 2021-06-07 DIAGNOSIS — Z86010 Personal history of colonic polyps: Secondary | ICD-10-CM

## 2021-06-07 MED ORDER — PEG 3350-KCL-NA BICARB-NACL 420 G PO SOLR: 4000.0000 mL | Freq: Once | ORAL | 0 refills | Status: AC

## 2021-06-07 NOTE — Telephone Encounter (Signed)
Yes [facility] will call patient to schedule OR send a letter to have them call back to schedule their appt. They will also send something in Mychart if they are active in [place].  ? ?I send the referral and since they are internal they will get the referral immediately in their box and will process it as soon as they get to it. They have been working through the referrals and contacting patients to schedule fairly quickly.  ?

## 2021-06-07 NOTE — Telephone Encounter (Addendum)
This pt saw [facility] - how about them? ? ?Plz notify pt she may call [facility] at [phone] to schedule an appointment.  ?

## 2021-06-07 NOTE — Progress Notes (Signed)
Gastroenterology Pre-Procedure Review ? ?Request Date: 07/04/2021 ?Requesting Physician: Dr. Vicente Males ? ?PATIENT REVIEW QUESTIONS: The patient responded to the following health history questions as indicated:  5 year recall ? ?1. Are you having any GI issues? no ?2. Do you have a personal history of Polyps? yes (05/2016 polyps removed) ?3. Do you have a family history of Colon Cancer or Polyps? no ?4. Diabetes Mellitus? no ?5. Joint replacements in the past 12 months?no ?6. Major health problems in the past 3 months?no ?7. Any artificial heart valves, MVP, or defibrillator?no ?   ?MEDICATIONS & ALLERGIES:    ?Patient reports the following regarding taking any anticoagulation/antiplatelet therapy:   ?Plavix, Coumadin, Eliquis, Xarelto, Lovenox, Pradaxa, Brilinta, or Effient? no ?Aspirin? no ? ?Patient confirms/reports the following medications:  ?Current Outpatient Medications  ?Medication Sig Dispense Refill  ? atenolol (TENORMIN) 50 MG tablet TAKE 1 TABLET BY MOUTH TWICE A DAY 180 tablet 3  ? loratadine (CLARITIN) 10 MG tablet Take 10 mg by mouth daily.    ? Multiple Vitamin (MULTIVITAMIN) tablet Take 1 tablet by mouth daily.    ? omeprazole (PRILOSEC) 20 MG capsule TAKE 1 CAPSULE BY MOUTH EVERY DAY (Patient taking differently: As needed) 90 capsule 3  ? Red Yeast Rice 600 MG CAPS Take 1 capsule (600 mg total) by mouth 2 (two) times daily.    ? valACYclovir (VALTREX) 500 MG tablet TAKE 1 TABLET (500 MG TOTAL) BY MOUTH 2 (TWO) TIMES DAILY. FOR 3 DAYS 30 tablet 1  ? ?No current facility-administered medications for this visit.  ? ? ?Patient confirms/reports the following allergies:  ?Allergies  ?Allergen Reactions  ? Neosporin [Neomycin-Bacitracin Zn-Polymyx] Rash  ? ? ?No orders of the defined types were placed in this encounter. ? ? ?AUTHORIZATION INFORMATION ?Primary Insurance: ?1D#: ?Group #: ? ?Secondary Insurance: ?1D#: ?Group #: ? ?SCHEDULE INFORMATION: ?Date: 07/04/2021 ?Time: ?Location: [facility] ? ?

## 2021-06-08 NOTE — Telephone Encounter (Signed)
Lvm asking pt to call back.  Need to relay Dr. G's message.  

## 2021-06-09 NOTE — Telephone Encounter (Signed)
Spoke with pt relaying Dr. Synthia Innocent message.  Pt states she called them and is scheduled on 07/04/21 and picked up prep yesterday.  ?

## 2021-06-29 ENCOUNTER — Encounter: Payer: Self-pay | Admitting: Gastroenterology

## 2021-07-04 ENCOUNTER — Ambulatory Visit: Payer: Federal, State, Local not specified - PPO | Admitting: Anesthesiology

## 2021-07-04 ENCOUNTER — Encounter
Admission: RE | Disposition: A | Discharge status: Home / Self Care | Payer: Self-pay | Source: Home / Self Care | Attending: Gastroenterology

## 2021-07-04 ENCOUNTER — Encounter: Payer: Self-pay | Admitting: Gastroenterology

## 2021-07-04 ENCOUNTER — Other Ambulatory Visit: Payer: Self-pay

## 2021-07-04 ENCOUNTER — Ambulatory Visit
Admission: RE | Admit: 2021-07-04 | Discharge: 2021-07-04 | Disposition: A | Discharge status: Home / Self Care | Payer: Federal, State, Local not specified - PPO | Attending: Gastroenterology | Admitting: Gastroenterology

## 2021-07-04 DIAGNOSIS — K219 Gastro-esophageal reflux disease without esophagitis: Secondary | ICD-10-CM | POA: Diagnosis not present

## 2021-07-04 DIAGNOSIS — Z86010 Personal history of colonic polyps: Secondary | ICD-10-CM

## 2021-07-04 DIAGNOSIS — Z1211 Encounter for screening for malignant neoplasm of colon: Secondary | ICD-10-CM | POA: Insufficient documentation

## 2021-07-04 DIAGNOSIS — I10 Essential (primary) hypertension: Secondary | ICD-10-CM | POA: Diagnosis not present

## 2021-07-04 HISTORY — PX: COLONOSCOPY WITH PROPOFOL: SHX5780

## 2021-07-04 SURGERY — COLONOSCOPY WITH PROPOFOL
Anesthesia: General

## 2021-07-04 MED ORDER — PROPOFOL 500 MG/50ML IV EMUL
INTRAVENOUS | Status: DC | PRN
  Administered 2021-07-04: 200 ug/kg/min via INTRAVENOUS

## 2021-07-04 MED ORDER — PROPOFOL 10 MG/ML IV BOLUS
INTRAVENOUS | Status: DC | PRN
  Administered 2021-07-04: 100 mg via INTRAVENOUS

## 2021-07-04 MED ORDER — SODIUM CHLORIDE 0.9 % IV SOLN: INTRAVENOUS | Status: DC

## 2021-07-04 NOTE — Anesthesia Preprocedure Evaluation (Addendum)
Anesthesia Evaluation  ?Patient identified by MRN, date of birth, ID band ?Patient awake ? ? ? ?Reviewed: ?Allergy & Precautions, NPO status , Patient's Chart, lab work & pertinent test results, reviewed documented beta blocker date and time  ? ?Airway ?Mallampati: III ? ?TM Distance: >3 FB ?Neck ROM: full ? ? ? Dental ? ?(+) Missing ?  ?Pulmonary ?neg pulmonary ROS,  ?  ?Pulmonary exam normal ? ? ? ? ? ? ? Cardiovascular ?hypertension, Pt. on home beta blockers and Pt. on medications ?Normal cardiovascular exam ? ? ?  ?Neuro/Psych ?negative neurological ROS ? negative psych ROS  ? GI/Hepatic ?Neg liver ROS, GERD  Controlled,  ?Endo/Other  ?negative endocrine ROS ? Renal/GU ?negative Renal ROS  ?negative genitourinary ?  ?Musculoskeletal ? ? Abdominal ?Normal abdominal exam  (+)   ?Peds ? Hematology ?Sickle cell trait    ?Anesthesia Other Findings ?Past Medical History: ?No date: Abnormal large bowel motility ?    Comment:  since child, uses laxatives/enemas regularly ?2004: Abnormal pigmentation of skin ?    Comment:  L ear antihelix - bx consistent with SK Sharlett Iles),  ?             recheck stable per Dr. Kellie Moor ?No date: Constipation ?    Comment:  severe requiring laxatives, failed miralax ?No date: Enlarged thyroid ?    Comment:  h/o abnl labs but never treated ?No date: Family history of breast cancer in first degree relative ?No date: History of colon polyps ?    Comment:  hyperplastic polyp 2008, 2013 hemorrhoids, rec rpt 5 yrs ?No date: History of herpes genitalis ?    Comment:  valtrex prn ?No date: HLD (hyperlipidemia) ?No date: HTN (hypertension) ?No date: IBS (irritable bowel syndrome) ?No date: Reflux esophagitis ?    Comment:  PPI ?No date: Sickle cell trait ([place]) ?12/22/2012: Subclinical hyperthyroidism ?    Comment:  yearly visit with endo ?06/2011: Thyroid nodule ?    Comment:  R nodule, FNA biopsy = benign follicular, stable [place]  ?             (12/2011 and  12/2012) ? ?Past Surgical History: ?07/2011: COLONOSCOPY ?    Comment:  hemorrhoids, rpt due 5 yrs given h/o polyps ?05/29/2016: COLONOSCOPY WITH PROPOFOL; N/A ?    Comment:  TA, rpt 5 yrs Jonathon Bellows, MD) ?No date: ESOPHAGOGASTRODUODENOSCOPY ?12/2011: thyroid US ?    Comment:  1cm solid nodule lower R pole (stable), no significant  ?             nodule on left side, rec rpt [place] in 1 yr ?11/2008: TVS ?    Comment:  multiple uterine fibroids ? ?BMI   ? Body Mass Index: 29.86 kg/m?  ?  ? ? Reproductive/Obstetrics ?negative OB ROS ? ?  ? ? ? ? ? ? ? ? ? ? ? ? ? ?  ?  ? ? ? ? ? ? ?Anesthesia Physical ?Anesthesia Plan ? ?ASA: 2 ? ?Anesthesia Plan: General  ? ?Post-op Pain Management:   ? ?Induction: Intravenous ? ?PONV Risk Score and Plan: Propofol infusion and TIVA ? ?Airway Management Planned: Natural Airway ? ?Additional Equipment:  ? ?Intra-op Plan:  ? ?Post-operative Plan:  ? ?Informed Consent: I have reviewed the patients History and Physical, chart, labs and discussed the procedure including the risks, benefits and alternatives for the proposed anesthesia with the patient or authorized representative who has indicated his/her understanding and acceptance.  ? ? ? ?Dental  Advisory Given ? ?Plan Discussed with: Anesthesiologist, CRNA and Surgeon ? ?Anesthesia Plan Comments:   ? ? ? ? ? ?Anesthesia Quick Evaluation ? ?

## 2021-07-04 NOTE — Op Note (Signed)
[facility] ?Gastroenterology ?Patient Name: Kendra Jackson ?Procedure Date: 07/04/2021 8:30 AM ?MRN: [id number] ?Account #: [id number] ?Date of Birth: 1961/01/31 ?Admit Type: Outpatient ?Age: 61 ?Room: [facility] ENDO ROOM 2 ?Gender: Female ?Note Status: Finalized ?Instrument Name: Colonoscope [id number] ?Procedure:             Colonoscopy ?Indications:           Surveillance: Personal history of adenomatous polyps  ?                       on last colonoscopy > 3 years ago ?Providers:             Jonathon Bellows MD, MD ?Referring MD:          Ria Bush (Referring MD) ?Medicines:             Monitored Anesthesia Care ?Complications:         No immediate complications. ?Procedure:             Pre-Anesthesia Assessment: ?                       - Prior to the procedure, a History and Physical was  ?                       performed, and patient medications, allergies and  ?                       sensitivities were reviewed. The patient's tolerance  ?                       of previous anesthesia was reviewed. ?                       - The risks and benefits of the procedure and the  ?                       sedation options and risks were discussed with the  ?                       patient. All questions were answered and informed  ?                       consent was obtained. ?                       - ASA Grade Assessment: II - A patient with mild  ?                       systemic disease. ?                       After obtaining informed consent, the colonoscope was  ?                       passed under direct vision. Throughout the procedure,  ?                       the patient's blood pressure, pulse, and oxygen  ?                       saturations  were monitored continuously. The  ?                       Colonoscope was introduced through the anus and  ?                       advanced to the the cecum, identified by the  ?                       appendiceal orifice. The colonoscopy was performed  ?                        with ease. The patient tolerated the procedure well.  ?                       The quality of the bowel preparation was excellent. ?Findings: ?     The perianal and digital rectal examinations were normal. ?     The entire examined colon appeared normal on direct and retroflexion  ?     views. ?Impression:            - The entire examined colon is normal on direct and  ?                       retroflexion views. ?                       - No specimens collected. ?Recommendation:        - Discharge patient to home (with escort). ?                       - Resume previous diet. ?                       - Continue present medications. ?                       - Repeat colonoscopy in 5 years for surveillance. ?Procedure Code(s):     --- Professional --- ?                       [phone], Colonoscopy, flexible; diagnostic, including  ?                       collection of specimen(s) by brushing or washing, when  ?                       performed (separate procedure) ?Diagnosis Code(s):     --- Professional --- ?                       Z86.010, Personal history of colonic polyps ?CPT copyright 2019 American Medical Association. All rights reserved. ?The codes documented in this report are preliminary and upon coder review may  ?be revised to meet current compliance requirements. ?Jonathon Bellows, MD ?Jonathon Bellows MD, MD ?07/04/2021 9:01:37 AM ?This report has been signed electronically. ?Number of Addenda: 0 ?Note Initiated On: 07/04/2021 8:30 AM ?Scope Withdrawal Time: 0 hours 17 minutes 52 seconds  ?Total Procedure Duration: 0 hours 21 minutes 49 seconds  ?Estimated Blood Loss:  Estimated blood loss: none. ?     [facility] ?

## 2021-07-04 NOTE — H&P (Signed)
?Kendra Bellows, MD ?[address] [place], [place], [place], [address] ?[address] [place], [place], [place], [address] ?Phone: [phone]  ?Fax: [phone] ? ?Primary Care Physician:  Ria Bush, MD ? ? ?Pre-Procedure History & Physical: ?HPI:  Kendra Jackson is a 61 y.o. female is here for an colonoscopy. ?  ?Past Medical History:  ?Diagnosis Date  ? Abnormal large bowel motility   ? since child, uses laxatives/enemas regularly  ? Abnormal pigmentation of skin 2004  ? L ear antihelix - bx consistent with SK Sharlett Iles), recheck stable per Dr. Kellie Moor  ? Constipation   ? severe requiring laxatives, failed miralax  ? Enlarged thyroid   ? h/o abnl labs but never treated  ? Family history of breast cancer in first degree relative   ? History of colon polyps   ? hyperplastic polyp 2008, 2013 hemorrhoids, rec rpt 5 yrs  ? History of herpes genitalis   ? valtrex prn  ? HLD (hyperlipidemia)   ? HTN (hypertension)   ? IBS (irritable bowel syndrome)   ? Reflux esophagitis   ? PPI  ? Sickle cell trait ([place])   ? Subclinical hyperthyroidism 12/22/2012  ? yearly visit with endo  ? Thyroid nodule 06/2011  ? R nodule, FNA biopsy = benign follicular, stable [place] (03/930 and 12/2012)  ? ? ?Past Surgical History:  ?Procedure Laterality Date  ? COLONOSCOPY  07/2011  ? hemorrhoids, rpt due 5 yrs given h/o polyps  ? COLONOSCOPY WITH PROPOFOL N/A 05/29/2016  ? TA, rpt 5 yrs Kendra Bellows, MD)  ? ESOPHAGOGASTRODUODENOSCOPY    ? thyroid US  12/2011  ? 1cm solid nodule lower R pole (stable), no significant nodule on left side, rec rpt [place] in 1 yr  ? TVS  11/2008  ? multiple uterine fibroids  ? ? ?Prior to Admission medications   ?Medication Sig Start Date End Date Taking? Authorizing Provider  ?aspirin EC 81 MG tablet Take 81 mg by mouth daily. Swallow whole.   Yes [provider]  ?atenolol (TENORMIN) 50 MG tablet TAKE 1 TABLET BY MOUTH TWICE A DAY 03/13/21  Yes Ria Bush, MD  ?Red Yeast Rice 600 MG CAPS Take 1  capsule (600 mg total) by mouth 2 (two) times daily. 01/09/18  Yes Ria Bush, MD  ?loratadine (CLARITIN) 10 MG tablet Take 10 mg by mouth daily.    [provider]  ?Multiple Vitamin (MULTIVITAMIN) tablet Take 1 tablet by mouth daily.    [provider]  ?omeprazole (PRILOSEC) 20 MG capsule TAKE 1 CAPSULE BY MOUTH EVERY DAY ?Patient taking differently: As needed 03/09/20   Ria Bush, MD  ?valACYclovir (VALTREX) 500 MG tablet TAKE 1 TABLET (500 MG TOTAL) BY MOUTH 2 (TWO) TIMES DAILY. FOR 3 DAYS 10/24/20   Ria Bush, MD  ? ? ?Allergies as of 06/07/2021 - Review Complete 03/31/2021  ?Allergen Reaction Noted  ? Neosporin [neomycin-bacitracin zn-polymyx] Rash 05/16/2011  ? ? ?Family History  ?Problem Relation Age of Onset  ? Heart disease Mother   ? Deep vein thrombosis Mother   ? Dementia Mother 1  ? Heart disease Father   ?     pig valve  ? Coronary artery disease Father   ? Cancer Father   ?     prostate  ? Cancer Maternal Grandmother   ?     colon  ? Stroke Maternal Grandfather   ? Diabetes Sister   ? Mental illness Sister   ?     schizophrenia  ?  Cancer Sister 39  ?     breast  ? Breast cancer Sister 55  ? Cancer Brother   ?     appendix tumor, prostate  ? Diabetes Brother   ? Mental illness Brother   ?     bipolar  ? Cancer Maternal Aunt   ?     bone  ? Cancer Maternal Uncle   ?     bladder  ? Coronary artery disease Paternal Grandfather   ? Cancer Cousin   ?     ovarian, bone  ? Breast cancer Cousin 32  ? Cancer Cousin   ?     breast  ? Breast cancer Other 39  ?     neice  ? ? ?Social History  ? ?Socioeconomic History  ? Marital status: Married  ?  Spouse name: Not on file  ? Number of children: 1  ? Years of education: Not on file  ? Highest education level: Not on file  ?Occupational History  ? Occupation: Scientist, research (medical)  ?  Employer: [employer]  ?Tobacco Use  ? Smoking status: Never  ? Smokeless tobacco: Never  ?Vaping Use  ? Vaping Use: Never used  ?Substance and  Sexual Activity  ? Alcohol use: Not Currently  ? Drug use: No  ? Sexual activity: Not on file  ?Other Topics Concern  ? Not on file  ?Social History Narrative  ? Caffeine: rare  ? Lives alone, no pets. Grown son 57yo, grandson died suddenly at 1yo 2010 (drowning) - lots of stress from this.  ? Occupation: Tour manager, works third shift  ? Activity: no regular exercise but bought treadmill  ? Diet: good water, fruits daily, vegetables occasionally  ? ?Social Determinants of Health  ? ?Financial Resource Strain: Not on file  ?Food Insecurity: Not on file  ?Transportation Needs: Not on file  ?Physical Activity: Not on file  ?Stress: Not on file  ?Social Connections: Not on file  ?Intimate Partner Violence: Not on file  ? ? ?Review of Systems: ?See HPI, otherwise negative ROS ? ?Physical Exam: ?BP (!) 167/91   Temp (!) 97.1 ?F (36.2 ?C) (Temporal)   Resp 14   Ht '5\' 6"'$  (1.676 m)   Wt 83.9 kg   SpO2 98%   BMI 29.86 kg/m?  ?General:   Alert,  pleasant and cooperative in NAD ?Head:  Normocephalic and atraumatic. ?Neck:  Supple; no masses or thyromegaly. ?Lungs:  Clear throughout to auscultation, normal respiratory effort.    ?Heart:  +S1, +S2, Regular rate and rhythm, No edema. ?Abdomen:  Soft, nontender and nondistended. Normal bowel sounds, without guarding, and without rebound.   ?Neurologic:  Alert and  oriented x4;  grossly normal neurologically. ? ?Impression/Plan: ?Kendra Jackson is here for an colonoscopy to be performed for surveillance due to prior history of colon polyps  ? ?Risks, benefits, limitations, and alternatives regarding  colonoscopy have been reviewed with the patient.  Questions have been answered.  All parties agreeable. ? ? ?Kendra Bellows, MD  07/04/2021, 8:27 AM ? ?

## 2021-07-04 NOTE — Anesthesia Postprocedure Evaluation (Signed)
Anesthesia Post Note ? ?Patient: Kendra Jackson ? ?Procedure(s) Performed: COLONOSCOPY WITH PROPOFOL ? ?Patient location during evaluation: Endoscopy ?Anesthesia Type: General ?Level of consciousness: awake and alert ?Pain management: pain level controlled ?Vital Signs Assessment: post-procedure vital signs reviewed and stable ?Respiratory status: spontaneous breathing, nonlabored ventilation and respiratory function stable ?Cardiovascular status: blood pressure returned to baseline and stable ?Postop Assessment: no apparent nausea or vomiting ?Anesthetic complications: no ? ? ?No notable events documented. ? ? ?Last Vitals:  ?Vitals:  ? 07/04/21 0914 07/04/21 0923  ?BP:  (!) 143/92  ?Resp:    ?Temp: 36.4 ?C   ?SpO2:    ?  ?Last Pain:  ?Vitals:  ? 07/04/21 0929  ?TempSrc:   ?PainSc: 0-No pain  ? ? ?  ?  ?  ?  ?  ?  ? ?[place] Ouch ? ? ? ? ?

## 2021-07-04 NOTE — Anesthesia Procedure Notes (Signed)
Date/Time: 07/04/2021 8:42 AM ?Performed by: Nelda Marseille, CRNA ?Pre-anesthesia Checklist: Patient identified, Emergency Drugs available, Suction available, Patient being monitored and Timeout performed ?Oxygen Delivery Method: Nasal cannula ? ? ? ? ?

## 2021-07-04 NOTE — Transfer of Care (Signed)
Immediate Anesthesia Transfer of Care Note ? ?Patient: Kendra Jackson ? ?Procedure(s) Performed: COLONOSCOPY WITH PROPOFOL ? ?Patient Location: PACU ? ?Anesthesia Type:General ? ?Level of Consciousness: awake, alert  and oriented ? ?Airway & Oxygen Therapy: Patient Spontanous Breathing and Patient connected to nasal cannula oxygen ? ?Post-op Assessment: Report given to RN and Post -op Vital signs reviewed and stable ? ?Post vital signs: Reviewed and stable ? ?Last Vitals:  ?Vitals Value Taken Time  ?BP 104/71 07/04/21 0903  ?Temp    ?Pulse 81 07/04/21 0904  ?Resp 16 07/04/21 0904  ?SpO2 97 % 07/04/21 0904  ?Vitals shown include unvalidated device data. ? ?Last Pain:  ?Vitals:  ? 07/04/21 0903  ?TempSrc:   ?PainSc: 0-No pain  ?   ? ?  ? ?Complications: No notable events documented. ?

## 2021-07-05 ENCOUNTER — Encounter: Payer: Self-pay | Admitting: Gastroenterology

## 2022-01-28 ENCOUNTER — Other Ambulatory Visit: Payer: Self-pay | Admitting: Family Medicine

## 2022-01-28 DIAGNOSIS — E785 Hyperlipidemia, unspecified: Secondary | ICD-10-CM

## 2022-01-28 DIAGNOSIS — E0590 Thyrotoxicosis, unspecified without thyrotoxic crisis or storm: Secondary | ICD-10-CM

## 2022-01-28 DIAGNOSIS — I10 Essential (primary) hypertension: Secondary | ICD-10-CM

## 2022-01-29 ENCOUNTER — Other Ambulatory Visit (INDEPENDENT_AMBULATORY_CARE_PROVIDER_SITE_OTHER): Payer: Federal, State, Local not specified - PPO

## 2022-01-29 DIAGNOSIS — I10 Essential (primary) hypertension: Secondary | ICD-10-CM | POA: Diagnosis not present

## 2022-01-29 DIAGNOSIS — E785 Hyperlipidemia, unspecified: Secondary | ICD-10-CM | POA: Diagnosis not present

## 2022-01-29 DIAGNOSIS — E0590 Thyrotoxicosis, unspecified without thyrotoxic crisis or storm: Secondary | ICD-10-CM

## 2022-01-29 LAB — MICROALBUMIN / CREATININE URINE RATIO
Creatinine,U: 94 mg/dL
Microalb Creat Ratio: 2.2 mg/g (ref 0.0–30.0)
Microalb, Ur: 2.1 mg/dL — ABNORMAL HIGH (ref 0.0–1.9)

## 2022-01-29 LAB — CBC WITH DIFFERENTIAL/PLATELET
Basophils Absolute: 0.1 10*3/uL (ref 0.0–0.1)
Basophils Relative: 1.2 % (ref 0.0–3.0)
Eosinophils Absolute: 0.2 10*3/uL (ref 0.0–0.7)
Eosinophils Relative: 3.2 % (ref 0.0–5.0)
HCT: 38.5 % (ref 36.0–46.0)
Hemoglobin: 12.4 g/dL (ref 12.0–15.0)
Lymphocytes Relative: 41.2 % (ref 12.0–46.0)
Lymphs Abs: 2.2 10*3/uL (ref 0.7–4.0)
MCHC: 32.3 g/dL (ref 30.0–36.0)
MCV: 79.2 fl (ref 78.0–100.0)
Monocytes Absolute: 0.5 10*3/uL (ref 0.1–1.0)
Monocytes Relative: 9.3 % (ref 3.0–12.0)
Neutro Abs: 2.4 10*3/uL (ref 1.4–7.7)
Neutrophils Relative %: 45.1 % (ref 43.0–77.0)
Platelets: 234 10*3/uL (ref 150.0–400.0)
RBC: 4.86 Mil/uL (ref 3.87–5.11)
RDW: 14.4 % (ref 11.5–15.5)
WBC: 5.2 10*3/uL (ref 4.0–10.5)

## 2022-01-29 LAB — COMPREHENSIVE METABOLIC PANEL
ALT: 15 U/L (ref 0–35)
AST: 16 U/L (ref 0–37)
Albumin: 4.4 g/dL (ref 3.5–5.2)
Alkaline Phosphatase: 81 U/L (ref 39–117)
BUN: 17 mg/dL (ref 6–23)
CO2: 30 mEq/L (ref 19–32)
Calcium: 9.4 mg/dL (ref 8.4–10.5)
Chloride: 105 mEq/L (ref 96–112)
Creatinine, Ser: 0.81 mg/dL (ref 0.40–1.20)
GFR: 77.35 mL/min (ref >60.00)
Glucose, Bld: 97 mg/dL (ref 70–99)
Potassium: 4.1 mEq/L (ref 3.5–5.1)
Sodium: 141 mEq/L (ref 135–145)
Total Bilirubin: 0.5 mg/dL (ref 0.2–1.2)
Total Protein: 7.4 g/dL (ref 6.0–8.3)

## 2022-01-29 LAB — LIPID PANEL
Cholesterol: 269 mg/dL — ABNORMAL HIGH (ref 0–200)
HDL: 79.8 mg/dL (ref >39.00)
LDL Cholesterol: 171 mg/dL — ABNORMAL HIGH (ref 0–99)
NonHDL: 189.29
Total CHOL/HDL Ratio: 3
Triglycerides: 89 mg/dL (ref 0.0–149.0)
VLDL: 17.8 mg/dL (ref 0.0–40.0)

## 2022-01-29 LAB — TSH: TSH: 0.51 u[IU]/mL (ref 0.35–5.50)

## 2022-01-29 LAB — T4, FREE: Free T4: 0.74 ng/dL (ref 0.60–1.60)

## 2022-02-06 ENCOUNTER — Other Ambulatory Visit: Payer: Self-pay | Admitting: Family Medicine

## 2022-02-06 ENCOUNTER — Ambulatory Visit (INDEPENDENT_AMBULATORY_CARE_PROVIDER_SITE_OTHER): Payer: Federal, State, Local not specified - PPO | Admitting: Family Medicine

## 2022-02-06 ENCOUNTER — Encounter: Payer: Self-pay | Admitting: Family Medicine

## 2022-02-06 VITALS — BP 148/90 | HR 73 | Temp 97.3°F | Ht 66.5 in | Wt 184.0 lb

## 2022-02-06 DIAGNOSIS — E0590 Thyrotoxicosis, unspecified without thyrotoxic crisis or storm: Secondary | ICD-10-CM

## 2022-02-06 DIAGNOSIS — E785 Hyperlipidemia, unspecified: Secondary | ICD-10-CM

## 2022-02-06 DIAGNOSIS — Z23 Encounter for immunization: Secondary | ICD-10-CM | POA: Diagnosis not present

## 2022-02-06 DIAGNOSIS — K599 Functional intestinal disorder, unspecified: Secondary | ICD-10-CM

## 2022-02-06 DIAGNOSIS — I10 Essential (primary) hypertension: Secondary | ICD-10-CM

## 2022-02-06 DIAGNOSIS — K2100 Gastro-esophageal reflux disease with esophagitis, without bleeding: Secondary | ICD-10-CM

## 2022-02-06 DIAGNOSIS — E042 Nontoxic multinodular goiter: Secondary | ICD-10-CM

## 2022-02-06 DIAGNOSIS — Z0000 Encounter for general adult medical examination without abnormal findings: Secondary | ICD-10-CM

## 2022-02-06 DIAGNOSIS — Z1231 Encounter for screening mammogram for malignant neoplasm of breast: Secondary | ICD-10-CM

## 2022-02-06 MED ORDER — ATORVASTATIN CALCIUM 20 MG PO TABS: 20.0000 mg | ORAL_TABLET | Freq: Every day | ORAL | 3 refills | Status: DC

## 2022-02-06 MED ORDER — ATENOLOL 50 MG PO TABS: 50.0000 mg | ORAL_TABLET | Freq: Two times a day (BID) | ORAL | 3 refills | Status: DC

## 2022-02-06 MED ORDER — OMEPRAZOLE 20 MG PO CPDR: 20.0000 mg | DELAYED_RELEASE_CAPSULE | Freq: Every day | ORAL | 1 refills | Status: DC | PRN

## 2022-02-06 NOTE — Assessment & Plan Note (Signed)
Followed by endo.  

## 2022-02-06 NOTE — Patient Instructions (Addendum)
Flu shot today  Call to schedule mammogram at your convenience:[facility] at [facility] [phone] Tetanus shot next year.  When you finish red yeast rice bottle, stop it and start atorvastatin '20mg'$  daily  Good to see you today Return as needed or in 1 year for next physical.  Return in 6 months for lab visit only (cholesterol).   Health Maintenance for Postmenopausal Women Menopause is a normal process in which your ability to get pregnant comes to an end. This process happens slowly over many months or years, usually between the ages of 106 and 63. Menopause is complete when you have missed your menstrual period for 12 months. It is important to talk with your health care provider about some of the most common conditions that affect women after menopause (postmenopausal women). These include heart disease, cancer, and bone loss (osteoporosis). Adopting a healthy lifestyle and getting preventive care can help to promote your health and wellness. The actions you take can also lower your chances of developing some of these common conditions. What are the signs and symptoms of menopause? During menopause, you may have the following symptoms: Hot flashes. These can be moderate or severe. Night sweats. Decrease in sex drive. Mood swings. Headaches. Tiredness (fatigue). Irritability. Memory problems. Problems falling asleep or staying asleep. Talk with your health care provider about treatment options for your symptoms. Do I need hormone replacement therapy? Hormone replacement therapy is effective in treating symptoms that are caused by menopause, such as hot flashes and night sweats. Hormone replacement carries certain risks, especially as you become older. If you are thinking about using estrogen or estrogen with progestin, discuss the benefits and risks with your health care provider. How can I reduce my risk for heart disease and stroke? The risk of heart disease, heart attack,  and stroke increases as you age. One of the causes may be a change in the body's hormones during menopause. This can affect how your body uses dietary fats, triglycerides, and cholesterol. Heart attack and stroke are medical emergencies. There are many things that you can do to help prevent heart disease and stroke. Watch your blood pressure High blood pressure causes heart disease and increases the risk of stroke. This is more likely to develop in people who have high blood pressure readings or are overweight. Have your blood pressure checked: Every 3-5 years if you are 10-37 years of age. Every year if you are 47 years old or older. Eat a healthy diet  Eat a diet that includes plenty of vegetables, fruits, low-fat dairy products, and lean protein. Do not eat a lot of foods that are high in solid fats, added sugars, or sodium. Get regular exercise Get regular exercise. This is one of the most important things you can do for your health. Most adults should: Try to exercise for at least 150 minutes each week. The exercise should increase your heart rate and make you sweat (moderate-intensity exercise). Try to do strengthening exercises at least twice each week. Do these in addition to the moderate-intensity exercise. Spend less time sitting. Even light physical activity can be beneficial. Other tips Work with your health care provider to achieve or maintain a healthy weight. Do not use any products that contain nicotine or tobacco. These products include cigarettes, chewing tobacco, and vaping devices, such as e-cigarettes. If you need help quitting, ask your health care provider. Know your numbers. Ask your health care provider to check your cholesterol and your blood sugar (glucose).  Continue to have your blood tested as directed by your health care provider. Do I need screening for cancer? Depending on your health history and family history, you may need to have cancer screenings at different  stages of your life. This may include screening for: Breast cancer. Cervical cancer. Lung cancer. Colorectal cancer. What is my risk for osteoporosis? After menopause, you may be at increased risk for osteoporosis. Osteoporosis is a condition in which bone destruction happens more quickly than new bone creation. To help prevent osteoporosis or the bone fractures that can happen because of osteoporosis, you may take the following actions: If you are 47-90 years old, get at least 1,000 mg of calcium and at least 600 international units (IU) of vitamin D per day. If you are older than age 34 but younger than age 63, get at least 1,200 mg of calcium and at least 600 international units (IU) of vitamin D per day. If you are older than age 26, get at least 1,200 mg of calcium and at least 800 international units (IU) of vitamin D per day. Smoking and drinking excessive alcohol increase the risk of osteoporosis. Eat foods that are rich in calcium and vitamin D, and do weight-bearing exercises several times each week as directed by your health care provider. How does menopause affect my mental health? Depression may occur at any age, but it is more common as you become older. Common symptoms of depression include: Feeling depressed. Changes in sleep patterns. Changes in appetite or eating patterns. Feeling an overall lack of motivation or enjoyment of activities that you previously enjoyed. Frequent crying spells. Talk with your health care provider if you think that you are experiencing any of these symptoms. General instructions See your health care provider for regular wellness exams and vaccines. This may include: Scheduling regular health, dental, and eye exams. Getting and maintaining your vaccines. These include: Influenza vaccine. Get this vaccine each year before the flu season begins. Pneumonia vaccine. Shingles vaccine. Tetanus, diphtheria, and pertussis (Tdap) booster vaccine. Your  health care provider may also recommend other immunizations. Tell your health care provider if you have ever been abused or do not feel safe at home. Summary Menopause is a normal process in which your ability to get pregnant comes to an end. This condition causes hot flashes, night sweats, decreased interest in sex, mood swings, headaches, or lack of sleep. Treatment for this condition may include hormone replacement therapy. Take actions to keep yourself healthy, including exercising regularly, eating a healthy diet, watching your weight, and checking your blood pressure and blood sugar levels. Get screened for cancer and depression. Make sure that you are up to date with all your vaccines. This information is not intended to replace advice given to you by your health care provider. Make sure you discuss any questions you have with your health care provider. Document Revised: 08/01/2020 Document Reviewed: 08/01/2020 Elsevier Patient Education  [place].

## 2022-02-06 NOTE — Assessment & Plan Note (Signed)
Established with endo, appreciate their care.

## 2022-02-06 NOTE — Assessment & Plan Note (Signed)
Overall improved period on daily probiotic, she notes she has needed to use less laxative.

## 2022-02-06 NOTE — Assessment & Plan Note (Signed)
Continue PRN omeprazole '20mg'$ 

## 2022-02-06 NOTE — Assessment & Plan Note (Signed)
Chronic, deteriorated. Reviewed limiting salt/sodium in diet, looking at food labels. Reassess at 6 mo f\u visit.

## 2022-02-06 NOTE — Assessment & Plan Note (Signed)
Preventative protocols reviewed and updated unless pt declined. Discussed healthy diet and lifestyle.  

## 2022-02-06 NOTE — Assessment & Plan Note (Signed)
Chronic, deteriorated despite daily RYR BID Rec start atorvastatin '20mg'$  daily - sent to pharmacy.  She will start when she finishes current RYR bottle. The 10-year ASCVD risk score (Arnett DK, et al., 2019) is: 13.3%   Values used to calculate the score:     Age: 61 years     Sex: Female     Is Non-Hispanic African American: Yes     Diabetic: No     Tobacco smoker: No     Systolic Blood Pressure: 053 mmHg     Is BP treated: Yes     HDL Cholesterol: 79.8 mg/dL     Total Cholesterol: 269 mg/dL

## 2022-02-06 NOTE — Progress Notes (Signed)
Patient ID: Kendra Jackson, female    DOB: Feb 13, 1961, 61 y.o.   MRN: [id number]  This visit was conducted in person.  BP (!) 148/90 (BP Location: Right Arm, Cuff Size: Large)   Pulse 73   Temp (!) 97.3 F (36.3 C) (Temporal)   Ht 5' 6.5" (1.689 m)   Wt 184 lb (83.5 kg)   SpO2 99%   BMI 29.25 kg/m    CC: CPE Subjective:   HPI: Kendra Jackson is a 61 y.o. female presenting on 02/06/2022 for Annual Exam   Sees endo Dr Kendra Jackson for multinodular goiter. Thyroid US 03/2021 - R inferior thyroid nodule slightly smaller, previously biopsied.   She is worried about her kidneys as 47yo nephew just went on dialysis, has multiple family members with kidney transplant and on dialysis.   Preventative: Colonoscopy 06/2021 - WNL, rpt 5 yrs Kendra Jackson) Well woman - remote h/o abnormal cells s/p cryotherapy. Pap smear 12/2017 WNL with cervical stenosis (absent endocervical zone), pap 01/2021 WNL rpt 3 yrs.  LMP - 04/12/2009  Mammogram - 02/2021 - [facility] @ [place]. Dense breasts.  Lung cancer screening - not eligible  Flu shot yearly  [place] 06/2019, 07/2019, booster 06/2020, bivalent 12/2020 Tdap 2013  Shingrix 12/2018, 03/2019 Advanced directive:  Seat belt use discussed  Sunscreen use discussed. No changing moles on skin.  Non smoker  Alcohol - none  Dentist yearly Eye exam yearly - due  Caffeine: rare   Lives alone, no pets. Grown son 38yo, grandson died suddenly at 1yo 2010 (drowning) - lots of stress from this.   Occupation: Tour manager, works third shift - retired 2018!  Activity: no regular exercise despite having treadmill  Diet: good water, fruits daily, vegetables occasionally       Relevant past medical, surgical, family and social history reviewed and updated as indicated. Interim medical history since our last visit reviewed. Allergies and medications reviewed and updated. Outpatient Medications Prior to Visit  Medication Sig Dispense Refill   aspirin EC 81  MG tablet Take 81 mg by mouth once a week. Swallow whole.     Multiple Vitamin (MULTIVITAMIN) tablet Take 1 tablet by mouth daily.     valACYclovir (VALTREX) 500 MG tablet TAKE 1 TABLET (500 MG TOTAL) BY MOUTH 2 (TWO) TIMES DAILY. FOR 3 DAYS 30 tablet 1   atenolol (TENORMIN) 50 MG tablet TAKE 1 TABLET BY MOUTH TWICE A DAY 180 tablet 3   omeprazole (PRILOSEC) 20 MG capsule TAKE 1 CAPSULE BY MOUTH EVERY DAY (Patient taking differently: As needed) 90 capsule 3   Red Yeast Rice 600 MG CAPS Take 1 capsule (600 mg total) by mouth 2 (two) times daily.     loratadine (CLARITIN) 10 MG tablet Take 10 mg by mouth daily.     No facility-administered medications prior to visit.     Per HPI unless specifically indicated in ROS section below Review of Systems  Constitutional:  Negative for activity change, appetite change, chills, fatigue, fever and unexpected weight change.  HENT:  Negative for hearing loss.   Eyes:  Negative for visual disturbance.  Respiratory:  Positive for cough. Negative for chest tightness, shortness of breath and wheezing.   Cardiovascular:  Negative for chest pain, palpitations and leg swelling.  Gastrointestinal:  Negative for abdominal distention, abdominal pain, blood in stool, constipation, diarrhea, nausea and vomiting.  Genitourinary:  Negative for difficulty urinating and hematuria.  Musculoskeletal:  Negative for arthralgias, myalgias and neck pain.  Skin:  Negative for rash.  Neurological:  Positive for dizziness ([place] Negative for seizures, syncope and headaches.  Hematological:  Negative for adenopathy. Does not bruise/bleed easily.  Psychiatric/Behavioral:  Negative for dysphoric mood. The patient is not nervous/anxious.     Objective:  BP (!) 148/90 (BP Location: Right Arm, Cuff Size: Large)   Pulse 73   Temp (!) 97.3 F (36.3 C) (Temporal)   Ht 5' 6.5" (1.689 m)   Wt 184 lb (83.5 kg)   SpO2 99%   BMI 29.25 kg/m   Wt Readings  from Last 3 Encounters:  02/06/22 184 lb (83.5 kg)  07/04/21 185 lb (83.9 kg)  03/31/21 184 lb (83.5 kg)      Physical Exam Vitals and nursing note reviewed.  Constitutional:      Appearance: Normal appearance. She is not ill-appearing.  HENT:     Head: Normocephalic and atraumatic.     Right Ear: Tympanic membrane, ear canal and external ear normal. There is no impacted cerumen.     Left Ear: Tympanic membrane, ear canal and external ear normal. There is no impacted cerumen.     Nose: Nose normal.     Mouth/Throat:     Mouth: Mucous membranes are moist.     Pharynx: Oropharynx is clear. No oropharyngeal exudate.  Eyes:     General:        Right eye: No discharge.        Left eye: No discharge.     Extraocular Movements: Extraocular movements intact.     Conjunctiva/sclera: Conjunctivae normal.     Pupils: Pupils are equal, round, and reactive to light.  Neck:     Thyroid: No thyroid mass or thyromegaly.  Cardiovascular:     Rate and Rhythm: Normal rate and regular rhythm.     Pulses: Normal pulses.     Heart sounds: Normal heart sounds. No murmur heard. Pulmonary:     Effort: Pulmonary effort is normal. No respiratory distress.     Breath sounds: Normal breath sounds. No wheezing, rhonchi or rales.  Abdominal:     General: Bowel sounds are normal. There is no distension.     Palpations: Abdomen is soft. There is no mass.     Tenderness: There is no abdominal tenderness. There is no guarding or rebound.     Hernia: No hernia is present.  Musculoskeletal:     Cervical back: Normal range of motion and neck supple. No rigidity.     Right lower leg: No edema.     Left lower leg: No edema.  Lymphadenopathy:     Cervical: No cervical adenopathy.  Skin:    General: Skin is warm and dry.     Findings: No rash.  Neurological:     General: No focal deficit present.     Mental Status: She is alert. Mental status is at baseline.  Psychiatric:        Mood and Affect: Mood  normal.        Behavior: Behavior normal.       Results for orders placed or performed in visit on 01/29/22  CBC with Differential/Platelet  Result Value Ref Range   WBC 5.2 4.0 - 10.5 K/uL   RBC 4.86 3.87 - 5.11 Mil/uL   Hemoglobin 12.4 12.0 - 15.0 g/dL   HCT 38.5 36.0 - 46.0 %   MCV 79.2 78.0 - 100.0 fl   MCHC 32.3 30.0 - 36.0 g/dL   RDW 14.4 11.5 -  15.5 %   Platelets 234.0 150.0 - 400.0 K/uL   Neutrophils Relative % 45.1 43.0 - 77.0 %   Lymphocytes Relative 41.2 12.0 - 46.0 %   Monocytes Relative 9.3 3.0 - 12.0 %   Eosinophils Relative 3.2 0.0 - 5.0 %   Basophils Relative 1.2 0.0 - 3.0 %   Neutro Abs 2.4 1.4 - 7.7 K/uL   Lymphs Abs 2.2 0.7 - 4.0 K/uL   Monocytes Absolute 0.5 0.1 - 1.0 K/uL   Eosinophils Absolute 0.2 0.0 - 0.7 K/uL   Basophils Absolute 0.1 0.0 - 0.1 K/uL  Lipid panel  Result Value Ref Range   Cholesterol 269 (H) 0 - 200 mg/dL   Triglycerides 89.0 0.0 - 149.0 mg/dL   HDL 79.80 >39.00 mg/dL   VLDL 17.8 0.0 - 40.0 mg/dL   LDL Cholesterol 171 (H) 0 - 99 mg/dL   Total CHOL/HDL Ratio 3    NonHDL 189.29   Comprehensive metabolic panel  Result Value Ref Range   Sodium 141 135 - 145 mEq/L   Potassium 4.1 3.5 - 5.1 mEq/L   Chloride 105 96 - 112 mEq/L   CO2 30 19 - 32 mEq/L   Glucose, Bld 97 70 - 99 mg/dL   BUN 17 6 - 23 mg/dL   Creatinine, Ser 0.81 0.40 - 1.20 mg/dL   Total Bilirubin 0.5 0.2 - 1.2 mg/dL   Alkaline Phosphatase 81 39 - 117 U/L   AST 16 0 - 37 U/L   ALT 15 0 - 35 U/L   Total Protein 7.4 6.0 - 8.3 g/dL   Albumin 4.4 3.5 - 5.2 g/dL   GFR 77.35 >60.00 mL/min   Calcium 9.4 8.4 - 10.5 mg/dL  TSH  Result Value Ref Range   TSH 0.51 0.35 - 5.50 uIU/mL  Microalbumin / creatinine urine ratio  Result Value Ref Range   Microalb, Ur 2.1 (H) 0.0 - 1.9 mg/dL   Creatinine,U 94.0 mg/dL   Microalb Creat Ratio 2.2 0.0 - 30.0 mg/g  T4, free  Result Value Ref Range   Free T4 0.74 0.60 - 1.60 ng/dL    Assessment & Plan:   Problem List Items  Addressed This Visit     Healthcare maintenance (Chronic)    Preventative protocols reviewed and updated unless pt declined. Discussed healthy diet and lifestyle.       HTN (hypertension)    Chronic, deteriorated. Reviewed limiting salt/sodium in diet, looking at food labels. Reassess at 6 mo f\u visit.       Relevant Medications   atorvastatin (LIPITOR) 20 MG tablet   atenolol (TENORMIN) 50 MG tablet   Abnormal large bowel motility    Overall improved period on daily probiotic, she notes she has needed to use less laxative.      Multinodular goiter    Established with endo, appreciate their care.       Relevant Medications   atenolol (TENORMIN) 50 MG tablet   HLD (hyperlipidemia)    Chronic, deteriorated despite daily RYR BID Rec start atorvastatin '20mg'$  daily - sent to pharmacy.  She will start when she finishes current RYR bottle. The 10-year ASCVD risk score (Arnett DK, et al., 2019) is: 13.3%   Values used to calculate the score:     Age: 28 years     Sex: Female     Is Non-Hispanic African American: Yes     Diabetic: No     Tobacco smoker: No     Systolic Blood Pressure: 330  mmHg     Is BP treated: Yes     HDL Cholesterol: 79.8 mg/dL     Total Cholesterol: 269 mg/dL       Relevant Medications   atorvastatin (LIPITOR) 20 MG tablet   atenolol (TENORMIN) 50 MG tablet   Subclinical hyperthyroidism    Followed by endo.       Relevant Medications   atenolol (TENORMIN) 50 MG tablet   Reflux esophagitis    Continue PRN omeprazole '20mg'$        Other Visit Diagnoses     Need for influenza vaccination    -  Primary   Relevant Orders   Flu Vaccine QUAD 40moIM (Fluarix, Fluzone & Alfiuria Quad PF) (Completed)        Meds ordered this encounter  Medications   atorvastatin (LIPITOR) 20 MG tablet    Sig: Take 1 tablet (20 mg total) by mouth daily.    Dispense:  90 tablet    Refill:  3   atenolol (TENORMIN) 50 MG tablet    Sig: Take 1 tablet (50 mg total) by  mouth 2 (two) times daily.    Dispense:  180 tablet    Refill:  3   omeprazole (PRILOSEC) 20 MG capsule    Sig: Take 1 capsule (20 mg total) by mouth daily as needed (GERD).    Dispense:  90 capsule    Refill:  1   Orders Placed This Encounter  Procedures   Flu Vaccine QUAD 672moM (Fluarix, Fluzone & Alfiuria Quad PF)    Patient instructions: Flu shot today  [facility] to [facility] at your convenience[facility] [phone] next year.  When you finish red yeast rice bottle, stop it and start atorvastatin '20mg'$  daily  Good to see you today Return as needed or in 6 months for follow up visit HTN and HLD  Follow up plan: Return in about 1 year (around 02/07/2023) for annual exam, prior fasting for blood work.  JaRia BushMD

## 2022-03-08 ENCOUNTER — Ambulatory Visit
Admission: RE | Admit: 2022-03-08 | Discharge: 2022-03-08 | Disposition: A | Discharge status: Home / Self Care | Payer: Federal, State, Local not specified - PPO | Source: Ambulatory Visit | Attending: Family Medicine | Admitting: Family Medicine

## 2022-03-08 DIAGNOSIS — Z1231 Encounter for screening mammogram for malignant neoplasm of breast: Secondary | ICD-10-CM | POA: Insufficient documentation

## 2022-08-07 ENCOUNTER — Other Ambulatory Visit (INDEPENDENT_AMBULATORY_CARE_PROVIDER_SITE_OTHER): Payer: Federal, State, Local not specified - PPO

## 2022-08-07 ENCOUNTER — Other Ambulatory Visit: Payer: Self-pay | Admitting: Family Medicine

## 2022-08-07 ENCOUNTER — Encounter: Payer: Self-pay | Admitting: Family Medicine

## 2022-08-07 DIAGNOSIS — E785 Hyperlipidemia, unspecified: Secondary | ICD-10-CM | POA: Diagnosis not present

## 2022-08-07 DIAGNOSIS — E0590 Thyrotoxicosis, unspecified without thyrotoxic crisis or storm: Secondary | ICD-10-CM

## 2022-08-07 LAB — LIPID PANEL
Cholesterol: 246 mg/dL — ABNORMAL HIGH (ref 0–200)
HDL: 73 mg/dL (ref >39.00)
LDL Cholesterol: 148 mg/dL — ABNORMAL HIGH (ref 0–99)
NonHDL: 172.67
Total CHOL/HDL Ratio: 3
Triglycerides: 123 mg/dL (ref 0.0–149.0)
VLDL: 24.6 mg/dL (ref 0.0–40.0)

## 2022-08-07 LAB — HEPATIC FUNCTION PANEL
ALT: 13 U/L (ref 0–35)
AST: 17 U/L (ref 0–37)
Albumin: 4.3 g/dL (ref 3.5–5.2)
Alkaline Phosphatase: 88 U/L (ref 39–117)
Bilirubin, Direct: 0.1 mg/dL (ref 0.0–0.3)
Total Bilirubin: 0.5 mg/dL (ref 0.2–1.2)
Total Protein: 7.4 g/dL (ref 6.0–8.3)

## 2022-08-07 LAB — TSH: TSH: 0.92 u[IU]/mL (ref 0.35–5.50)

## 2022-08-07 NOTE — Addendum Note (Signed)
Addended by: Alvina Chou on: 08/07/2022 02:03 PM   Modules accepted: Orders

## 2022-08-10 ENCOUNTER — Ambulatory Visit: Payer: Federal, State, Local not specified - PPO | Admitting: Family Medicine

## 2022-08-10 ENCOUNTER — Encounter: Payer: Self-pay | Admitting: Family Medicine

## 2022-08-10 VITALS — BP 136/88 | HR 64 | Temp 97.3°F | Ht 66.5 in | Wt 179.4 lb

## 2022-08-10 DIAGNOSIS — E0590 Thyrotoxicosis, unspecified without thyrotoxic crisis or storm: Secondary | ICD-10-CM | POA: Diagnosis not present

## 2022-08-10 DIAGNOSIS — E785 Hyperlipidemia, unspecified: Secondary | ICD-10-CM | POA: Diagnosis not present

## 2022-08-10 DIAGNOSIS — E042 Nontoxic multinodular goiter: Secondary | ICD-10-CM

## 2022-08-10 DIAGNOSIS — I10 Essential (primary) hypertension: Secondary | ICD-10-CM | POA: Diagnosis not present

## 2022-08-10 MED ORDER — PSYLLIUM 95 % PO PACK: 1.0000 | PACK | Freq: Every day | ORAL | Status: DC

## 2022-08-10 NOTE — Assessment & Plan Note (Addendum)
Chronic, has not been taking statin. She continues RYR BID regularly, desires to continue this until she finishes current supply. Encouraged she start atorvastatin once RYR completed. She is also taking metamucil fiber supplement.  The 10-year ASCVD risk score (Arnett DK, et al., 2019) is: 10.3%   Values used to calculate the score:     Age: 62 years     Sex: Female     Is Non-Hispanic African American: Yes     Diabetic: No     Tobacco smoker: No     Systolic Blood Pressure: 136 mmHg     Is BP treated: Yes     HDL Cholesterol: 73 mg/dL     Total Cholesterol: 246 mg/dL

## 2022-08-10 NOTE — Assessment & Plan Note (Signed)
Last saw endo 03/2021, unclear if f/u recommended. We will continue to monitor TFTs.

## 2022-08-10 NOTE — Progress Notes (Signed)
Ph: [phone] Fax: [phone]   Patient ID: Kendra Jackson, female    DOB: 11/08/1960, 62 y.o.   MRN: [id number]  This visit was conducted in person.  BP 136/88   Pulse 64   Temp (!) 97.3 F (36.3 C) (Temporal)   Ht 5' 6.5" (1.689 m)   Wt 179 lb 6 oz (81.4 kg)   SpO2 98%   BMI 28.52 kg/m    CC: 6 mo HTN f/u visit  Subjective:   HPI: Kendra Jackson is a 62 y.o. female presenting on 08/10/2022 for Medical Management of Chronic Issues (Here for 6 mo HTN f/u. Also, pt c/o rash in R axilla. )   Noted rash to R axilla - she's been using lotrimin cream with benefit.  Note she easily sweats but also stays cold.   HTN - Compliant with current antihypertensive regimen of atenolol 50mg  bid.  Does not check blood pressures at home. Doesn't have cuff. No low blood pressure readings or symptoms of dizziness/syncope. Denies HA, vision changes, CP/tightness, SOB, leg swelling.   H/o hyperthyroidism with multinodular goiter - off thyroid medication.  Lab Results  Component Value Date   TSH 0.92 08/07/2022   HLD - has not taken atorvastatin - has been taking RYR she had at home.  She is taking metamucil fiber daily.      Relevant past medical, surgical, family and social history reviewed and updated as indicated. Interim medical history since our last visit reviewed. Allergies and medications reviewed and updated. Outpatient Medications Prior to Visit  Medication Sig Dispense Refill   aspirin EC 81 MG tablet Take 81 mg by mouth once a week. Swallow whole.     atenolol (TENORMIN) 50 MG tablet Take 1 tablet (50 mg total) by mouth 2 (two) times daily. 180 tablet 3   Multiple Vitamin (MULTIVITAMIN) tablet Take 1 tablet by mouth daily.     omeprazole (PRILOSEC) 20 MG capsule Take 1 capsule (20 mg total) by mouth daily as needed (GERD). 90 capsule 1   Red Yeast Rice 600 MG CAPS Take 1 capsule by mouth 2 (two) times daily.     valACYclovir (VALTREX) 500 MG tablet TAKE 1 TABLET (500 MG  TOTAL) BY MOUTH 2 (TWO) TIMES DAILY. FOR 3 DAYS 30 tablet 1   atorvastatin (LIPITOR) 20 MG tablet Take 1 tablet (20 mg total) by mouth daily. (Patient not taking: Reported on 08/10/2022) 90 tablet 3   No facility-administered medications prior to visit.     Per HPI unless specifically indicated in ROS section below Review of Systems  Objective:  BP 136/88   Pulse 64   Temp (!) 97.3 F (36.3 C) (Temporal)   Ht 5' 6.5" (1.689 m)   Wt 179 lb 6 oz (81.4 kg)   SpO2 98%   BMI 28.52 kg/m   Wt Readings from Last 3 Encounters:  08/10/22 179 lb 6 oz (81.4 kg)  02/06/22 184 lb (83.5 kg)  07/04/21 185 lb (83.9 kg)      Physical Exam Vitals and nursing note reviewed.  Constitutional:      Appearance: Normal appearance. She is not ill-appearing.  HENT:     Head: Normocephalic and atraumatic.     Right Ear: External ear normal. There is no impacted cerumen.     Left Ear: Tympanic membrane, ear canal and external ear normal. There is impacted cerumen.     Mouth/Throat:     Mouth: Mucous membranes are moist.     Pharynx: Oropharynx  is clear. No oropharyngeal exudate or posterior oropharyngeal erythema.  Eyes:     Extraocular Movements: Extraocular movements intact.     Pupils: Pupils are equal, round, and reactive to light.  Neck:     Thyroid: No thyroid mass, thyromegaly or thyroid tenderness.  Cardiovascular:     Rate and Rhythm: Normal rate and regular rhythm.     Pulses: Normal pulses.     Heart sounds: Normal heart sounds. No murmur heard. Pulmonary:     Effort: Pulmonary effort is normal. No respiratory distress.     Breath sounds: Normal breath sounds. No wheezing, rhonchi or rales.  Musculoskeletal:     Cervical back: Normal range of motion and neck supple.     Right lower leg: No edema.     Left lower leg: No edema.  Skin:    General: Skin is warm and dry.     Findings: No rash.  Neurological:     Mental Status: She is alert.       Results for orders placed or  performed in visit on 08/07/22  Hepatic function panel  Result Value Ref Range   Total Bilirubin 0.5 0.2 - 1.2 mg/dL   Bilirubin, Direct 0.1 0.0 - 0.3 mg/dL   Alkaline Phosphatase 88 39 - 117 U/L   AST 17 0 - 37 U/L   ALT 13 0 - 35 U/L   Total Protein 7.4 6.0 - 8.3 g/dL   Albumin 4.3 3.5 - 5.2 g/dL  Lipid panel  Result Value Ref Range   Cholesterol 246 (H) 0 - 200 mg/dL   Triglycerides 161.0 0.0 - 149.0 mg/dL   HDL 96.04 >54.09 mg/dL   VLDL 81.1 0.0 - 91.4 mg/dL   LDL Cholesterol 782 (H) 0 - 99 mg/dL   Total CHOL/HDL Ratio 3    NonHDL 172.67   TSH  Result Value Ref Range   TSH 0.92 0.35 - 5.50 uIU/mL   Lab Results  Component Value Date   CREATININE 0.81 01/29/2022   BUN 17 01/29/2022   NA 141 01/29/2022   K 4.1 01/29/2022   CL 105 01/29/2022   CO2 30 01/29/2022    Assessment & Plan:   Problem List Items Addressed This Visit     HTN (hypertension) - Primary    Chronic, stable on current regimen - continue.       Multinodular goiter    Saw endo Dr Lonzo Cloud 03/2021, with reassuring thyroid US at that time. No f/u US recommended. No noted f/u planned with endo. Will continue to monitor TFTs.       HLD (hyperlipidemia)    Chronic, has not been taking statin. She continues RYR BID regularly, desires to continue this until she finishes current supply. Encouraged she start atorvastatin once RYR completed. She is also taking metamucil fiber supplement.  The 10-year ASCVD risk score (Arnett DK, et al., 2019) is: 10.3%   Values used to calculate the score:     Age: 46 years     Sex: Female     Is Non-Hispanic African American: Yes     Diabetic: No     Tobacco smoker: No     Systolic Blood Pressure: 136 mmHg     Is BP treated: Yes     HDL Cholesterol: 73 mg/dL     Total Cholesterol: 246 mg/dL       Subclinical hyperthyroidism    Last saw endo 03/2021, unclear if f/u recommended. We will continue to monitor TFTs.  Meds ordered this encounter  Medications    psyllium (HYDROCIL/METAMUCIL) 95 % PACK    Sig: Take 1 packet by mouth daily.    No orders of the defined types were placed in this encounter.   Patient Instructions  Continue red yeast rice until you finish bottle, then start atorvastatin. Let me know if you need this refilled.  Return as needed or in 6 months for physical.   Follow up plan: Return in about 6 months (around 02/10/2023) for annual exam, prior fasting for blood work.  Eustaquio Boyden, MD

## 2022-08-10 NOTE — Assessment & Plan Note (Signed)
Chronic, stable on current regimen - continue. 

## 2022-08-10 NOTE — Patient Instructions (Addendum)
Continue red yeast rice until you finish bottle, then start atorvastatin. Let me know if you need this refilled.  Return as needed or in 6 months for physical.

## 2022-08-10 NOTE — Assessment & Plan Note (Signed)
Saw endo Dr Lonzo Cloud 03/2021, with reassuring thyroid US at that time. No f/u US recommended. No noted f/u planned with endo. Will continue to monitor TFTs.

## 2022-11-30 ENCOUNTER — Encounter: Payer: Self-pay | Admitting: Family Medicine

## 2022-11-30 ENCOUNTER — Ambulatory Visit: Payer: Federal, State, Local not specified - PPO | Admitting: Family Medicine

## 2022-11-30 VITALS — BP 134/82 | HR 85 | Temp 97.3°F | Ht 66.5 in | Wt 179.0 lb

## 2022-11-30 DIAGNOSIS — E785 Hyperlipidemia, unspecified: Secondary | ICD-10-CM

## 2022-11-30 DIAGNOSIS — Z23 Encounter for immunization: Secondary | ICD-10-CM

## 2022-11-30 DIAGNOSIS — I10 Essential (primary) hypertension: Secondary | ICD-10-CM

## 2022-11-30 DIAGNOSIS — R350 Frequency of micturition: Secondary | ICD-10-CM

## 2022-11-30 DIAGNOSIS — N3000 Acute cystitis without hematuria: Secondary | ICD-10-CM | POA: Diagnosis not present

## 2022-11-30 DIAGNOSIS — N390 Urinary tract infection, site not specified: Secondary | ICD-10-CM | POA: Insufficient documentation

## 2022-11-30 LAB — POC URINALSYSI DIPSTICK (AUTOMATED)
Bilirubin, UA: NEGATIVE
Blood, UA: NEGATIVE
Glucose, UA: NEGATIVE
Ketones, UA: NEGATIVE
Nitrite, UA: NEGATIVE
Protein, UA: NEGATIVE
Spec Grav, UA: 1.025 (ref 1.010–1.025)
Urobilinogen, UA: 0.2 U/dL
pH, UA: 5 (ref 5.0–8.0)

## 2022-11-30 MED ORDER — NITROFURANTOIN MONOHYD MACRO 100 MG PO CAPS: 100.0000 mg | ORAL_CAPSULE | Freq: Two times a day (BID) | ORAL | 0 refills | Status: AC

## 2022-11-30 NOTE — Assessment & Plan Note (Signed)
Not on statin. She continues RYR and fiber supplement.  Discussed coronary CT calcium score self pay study - she will consider this for CAD screening.

## 2022-11-30 NOTE — Assessment & Plan Note (Signed)
Anticipate acute cystitis given UA/micro and symptoms. UCx sent. Rx macrobid 5d course.  Update if not improving with treatment.

## 2022-11-30 NOTE — Patient Instructions (Addendum)
Flu shot today  EKG today.  Urine suspicious for infection - culture sent and we will be in touch with results. Go ahead and start macrobid 100mg  twice daily for 5 days to treat possible infection.

## 2022-11-30 NOTE — Progress Notes (Signed)
Ph: [phone] Fax: [phone]   Patient ID: Loleta Dicker, female    DOB: 1960/09/22, 62 y.o.   MRN: [id number]  This visit was conducted in person.  BP 134/82   Pulse 85   Temp (!) 97.3 F (36.3 C) (Temporal)   Ht 5' 6.5" (1.689 m)   Wt 179 lb (81.2 kg)   SpO2 98%   BMI 28.46 kg/m    CC: urinary frequency  Subjective:   HPI: Chantrice Bohnen is a 62 y.o. female presenting on 11/30/2022 for Urinary Frequency (C/o urinary frequency and urine seems more foamy. Sxs started 1-2 wks ago. Concerned due to fmhx of kidney failure. )   1-2 wk h/o increased urinary frequency associated with foamy urine. Some lower abd pain.  No dysuria, urgency, flank, nausea/vomiting or fevers/chills.  Concerned given fmhx kidney disease. First cousin had kidney transplant for hypertensive kidney disease.   Notes increased sweating. 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  Lab Results  Component Value Date   TSH 0.92 08/07/2022   T3TOTAL 116 02/03/2021    Lab Results  Component Value Date   NA 141 01/29/2022   CL 105 01/29/2022   K 4.1 01/29/2022   CO2 30 01/29/2022   BUN 17 01/29/2022   CREATININE 0.81 01/29/2022   GFR 77.35 01/29/2022   CALCIUM 9.4 01/29/2022   ALBUMIN 4.3 08/07/2022   GLUCOSE 97 01/29/2022     HTN - on atenolol 50mg  BID.      Relevant past medical, surgical, family and social history reviewed and updated as indicated. Interim medical history since our last visit reviewed. Allergies and medications reviewed and updated. Outpatient Medications Prior to Visit  Medication Sig Dispense Refill   aspirin EC 81 MG tablet Take 81 mg by mouth once a week. Swallow whole.     atenolol (TENORMIN) 50 MG tablet Take 1 tablet (50 mg total) by mouth 2 (two) times daily. 180 tablet 3   Multiple Vitamin (MULTIVITAMIN) tablet Take 1 tablet by mouth daily.     omeprazole (PRILOSEC) 20 MG capsule Take 1 capsule (20 mg total) by mouth daily as needed (GERD). 90 capsule 1    psyllium (HYDROCIL/METAMUCIL) 95 % PACK Take 1 packet by mouth daily.     Red Yeast Rice 600 MG CAPS Take 1 capsule by mouth 2 (two) times daily.     valACYclovir (VALTREX) 500 MG tablet TAKE 1 TABLET (500 MG TOTAL) BY MOUTH 2 (TWO) TIMES DAILY. FOR 3 DAYS 30 tablet 1   No facility-administered medications prior to visit.     Per HPI unless specifically indicated in ROS section below Review of Systems  Objective:  BP 134/82   Pulse 85   Temp (!) 97.3 F (36.3 C) (Temporal)   Ht 5' 6.5" (1.689 m)   Wt 179 lb (81.2 kg)   SpO2 98%   BMI 28.46 kg/m   Wt Readings from Last 3 Encounters:  11/30/22 179 lb (81.2 kg)  08/10/22 179 lb 6 oz (81.4 kg)  02/06/22 184 lb (83.5 kg)      Physical Exam Vitals and nursing note reviewed.  Constitutional:      Appearance: Normal appearance. She is not ill-appearing.  HENT:     Mouth/Throat:     Pharynx: Oropharynx is clear. No oropharyngeal exudate or posterior oropharyngeal erythema.  Cardiovascular:     Rate and Rhythm: Normal rate and regular rhythm.     Pulses: Normal pulses.     Heart sounds: Normal heart  sounds. No murmur heard. Pulmonary:     Effort: Pulmonary effort is normal. No respiratory distress.     Breath sounds: Normal breath sounds. No wheezing, rhonchi or rales.  Abdominal:     General: Bowel sounds are normal. There is no distension.     Palpations: Abdomen is soft. There is no mass.     Tenderness: There is no abdominal tenderness. There is no right CVA tenderness, left CVA tenderness, guarding or rebound.     Hernia: No hernia is present.  Musculoskeletal:     Right lower leg: No edema.     Left lower leg: No edema.  Skin:    General: Skin is warm and dry.     Findings: No rash.  Neurological:     Mental Status: She is alert.  Psychiatric:        Mood and Affect: Mood normal.        Behavior: Behavior normal.       Results for orders placed or performed in visit on 11/30/22  POCT Urinalysis Dipstick  (Automated)  Result Value Ref Range   Color, UA yellow    Clarity, UA clear    Glucose, UA Negative Negative   Bilirubin, UA negative    Ketones, UA negative    Spec Grav, UA 1.025 1.010 - 1.025   Blood, UA negative    pH, UA 5.0 5.0 - 8.0   Protein, UA Negative Negative   Urobilinogen, UA 0.2 0.2 or 1.0 E.U./dL   Nitrite, UA negative    Leukocytes, UA Small (1+) (A) Negative   EKG - NSR rate 70, normal axis, intervals, no hypertrophy or acute ST/T changes. Good R wave progression.   Assessment & Plan:   Problem List Items Addressed This Visit     HTN (hypertension)    Chronic, stable on atenolol BID - continue. Discussed CAD screening with calcium score and estimated cost as insurance may not cover - she is unsure about affordability of self pay study but will consider this. Will update EKG today.       Relevant Orders   EKG 12-Lead (Completed)   HLD (hyperlipidemia)    Not on statin. She continues RYR and fiber supplement.  Discussed coronary CT calcium score self pay study - she will consider this for CAD screening.       UTI (urinary tract infection) - Primary    Anticipate acute cystitis given UA/micro and symptoms. UCx sent. Rx macrobid 5d course.  Update if not improving with treatment.       Relevant Medications   nitrofurantoin, macrocrystal-monohydrate, (MACROBID) 100 MG capsule   Other Visit Diagnoses     Urinary frequency       Relevant Orders   POCT Urinalysis Dipstick (Automated) (Completed)   Urine Culture   Encounter for immunization       Relevant Orders   Flu vaccine trivalent PF, 6mos and older(Flulaval,Afluria,Fluarix,Fluzone) (Completed)        Meds ordered this encounter  Medications   nitrofurantoin, macrocrystal-monohydrate, (MACROBID) 100 MG capsule    Sig: Take 1 capsule (100 mg total) by mouth 2 (two) times daily for 5 days.    Dispense:  10 capsule    Refill:  0    Orders Placed This Encounter  Procedures   Urine Culture    Flu vaccine trivalent PF, 6mos and older(Flulaval,Afluria,Fluarix,Fluzone)   POCT Urinalysis Dipstick (Automated)   EKG 12-Lead    Patient Instructions  Flu shot today  EKG today.  Urine suspicious for infection - culture sent and we will be in touch with results. Go ahead and start macrobid 100mg  twice daily for 5 days to treat possible infection.   Follow up plan: Return if symptoms worsen or fail to improve.  Eustaquio Boyden, MD

## 2022-11-30 NOTE — Assessment & Plan Note (Addendum)
Chronic, stable on atenolol BID - continue. Discussed CAD screening with calcium score and estimated cost as insurance may not cover - she is unsure about affordability of self pay study but will consider this. Will update EKG today.

## 2022-12-01 LAB — URINE CULTURE
MICRO NUMBER:: 15432116
Result:: NO GROWTH
SPECIMEN QUALITY:: ADEQUATE

## 2023-01-31 ENCOUNTER — Other Ambulatory Visit: Payer: Self-pay | Admitting: Family Medicine

## 2023-01-31 DIAGNOSIS — R300 Dysuria: Secondary | ICD-10-CM

## 2023-01-31 DIAGNOSIS — E785 Hyperlipidemia, unspecified: Secondary | ICD-10-CM

## 2023-01-31 DIAGNOSIS — E0590 Thyrotoxicosis, unspecified without thyrotoxic crisis or storm: Secondary | ICD-10-CM

## 2023-02-01 ENCOUNTER — Other Ambulatory Visit (INDEPENDENT_AMBULATORY_CARE_PROVIDER_SITE_OTHER): Payer: Federal, State, Local not specified - PPO

## 2023-02-01 DIAGNOSIS — E785 Hyperlipidemia, unspecified: Secondary | ICD-10-CM | POA: Diagnosis not present

## 2023-02-01 DIAGNOSIS — E0590 Thyrotoxicosis, unspecified without thyrotoxic crisis or storm: Secondary | ICD-10-CM

## 2023-02-01 DIAGNOSIS — R300 Dysuria: Secondary | ICD-10-CM

## 2023-02-01 LAB — URINALYSIS, ROUTINE W REFLEX MICROSCOPIC
Bilirubin Urine: NEGATIVE
Hgb urine dipstick: NEGATIVE
Ketones, ur: NEGATIVE
Leukocytes,Ua: NEGATIVE
Nitrite: NEGATIVE
RBC / HPF: NONE SEEN (ref 0–?)
Specific Gravity, Urine: 1.015 (ref 1.000–1.030)
Total Protein, Urine: NEGATIVE
Urine Glucose: NEGATIVE
Urobilinogen, UA: 0.2 (ref 0.0–1.0)
WBC, UA: NONE SEEN (ref 0–?)
pH: 6 (ref 5.0–8.0)

## 2023-02-01 LAB — COMPREHENSIVE METABOLIC PANEL
ALT: 12 U/L (ref 0–35)
AST: 14 U/L (ref 0–37)
Albumin: 4.4 g/dL (ref 3.5–5.2)
Alkaline Phosphatase: 87 U/L (ref 39–117)
BUN: 12 mg/dL (ref 6–23)
CO2: 29 meq/L (ref 19–32)
Calcium: 9.4 mg/dL (ref 8.4–10.5)
Chloride: 105 meq/L (ref 96–112)
Creatinine, Ser: 0.87 mg/dL (ref 0.40–1.20)
GFR: 70.49 mL/min (ref >60.00)
Glucose, Bld: 92 mg/dL (ref 70–99)
Potassium: 3.9 meq/L (ref 3.5–5.1)
Sodium: 142 meq/L (ref 135–145)
Total Bilirubin: 0.4 mg/dL (ref 0.2–1.2)
Total Protein: 7.4 g/dL (ref 6.0–8.3)

## 2023-02-01 LAB — LIPID PANEL
Cholesterol: 265 mg/dL — ABNORMAL HIGH (ref 0–200)
HDL: 74.7 mg/dL (ref >39.00)
LDL Cholesterol: 171 mg/dL — ABNORMAL HIGH (ref 0–99)
NonHDL: 190.77
Total CHOL/HDL Ratio: 4
Triglycerides: 101 mg/dL (ref 0.0–149.0)
VLDL: 20.2 mg/dL (ref 0.0–40.0)

## 2023-02-01 LAB — TSH: TSH: 0.69 u[IU]/mL (ref 0.35–5.50)

## 2023-02-01 LAB — T4, FREE: Free T4: 0.67 ng/dL (ref 0.60–1.60)

## 2023-02-01 NOTE — Addendum Note (Signed)
Addended by: Eustaquio Boyden on: 02/01/2023 08:25 AM   Modules accepted: Orders

## 2023-02-05 LAB — LIPOPROTEIN A (LPA): Lipoprotein (a): 377 nmol/L — ABNORMAL HIGH (ref <75)

## 2023-02-05 LAB — T3: T3, Total: 118 ng/dL (ref 76–181)

## 2023-02-08 ENCOUNTER — Encounter: Payer: Self-pay | Admitting: Family Medicine

## 2023-02-08 ENCOUNTER — Ambulatory Visit (INDEPENDENT_AMBULATORY_CARE_PROVIDER_SITE_OTHER): Payer: Federal, State, Local not specified - PPO | Admitting: Family Medicine

## 2023-02-08 VITALS — BP 138/78 | HR 75 | Temp 98.3°F | Ht 65.75 in | Wt 183.0 lb

## 2023-02-08 DIAGNOSIS — E0590 Thyrotoxicosis, unspecified without thyrotoxic crisis or storm: Secondary | ICD-10-CM

## 2023-02-08 DIAGNOSIS — Z23 Encounter for immunization: Secondary | ICD-10-CM | POA: Diagnosis not present

## 2023-02-08 DIAGNOSIS — E7841 Elevated Lipoprotein(a): Secondary | ICD-10-CM

## 2023-02-08 DIAGNOSIS — Z0000 Encounter for general adult medical examination without abnormal findings: Secondary | ICD-10-CM

## 2023-02-08 DIAGNOSIS — Z7189 Other specified counseling: Secondary | ICD-10-CM | POA: Insufficient documentation

## 2023-02-08 DIAGNOSIS — K2100 Gastro-esophageal reflux disease with esophagitis, without bleeding: Secondary | ICD-10-CM

## 2023-02-08 DIAGNOSIS — K599 Functional intestinal disorder, unspecified: Secondary | ICD-10-CM

## 2023-02-08 DIAGNOSIS — E042 Nontoxic multinodular goiter: Secondary | ICD-10-CM

## 2023-02-08 DIAGNOSIS — I10 Essential (primary) hypertension: Secondary | ICD-10-CM

## 2023-02-08 MED ORDER — RED YEAST RICE 600 MG PO CAPS: 1.0000 | ORAL_CAPSULE | Freq: Two times a day (BID) | ORAL | Status: DC

## 2023-02-08 MED ORDER — ATENOLOL 50 MG PO TABS: 50.0000 mg | ORAL_TABLET | Freq: Two times a day (BID) | ORAL | 4 refills | Status: DC

## 2023-02-08 MED ORDER — LINACLOTIDE 145 MCG PO CAPS: 145.0000 ug | ORAL_CAPSULE | Freq: Every day | ORAL | 3 refills | Status: DC

## 2023-02-08 MED ORDER — ROSUVASTATIN CALCIUM 10 MG PO TABS: 10.0000 mg | ORAL_TABLET | Freq: Every day | ORAL | 4 refills | Status: DC

## 2023-02-08 NOTE — Assessment & Plan Note (Addendum)
Chronic, deteriorated despite RYR BID with elevated Lp(a) to 377 Agrees to start rosuvastatin 10mg  daily. Agrees to proceed with coronary CT with CAC.  The 10-year ASCVD risk score (Arnett DK, et al., 2019) is: 12.2%   Values used to calculate the score:     Age: 62 years     Sex: Female     Is Non-Hispanic African American: Yes     Diabetic: No     Tobacco smoker: No     Systolic Blood Pressure: 138 mmHg     Is BP treated: Yes     HDL Cholesterol: 74.7 mg/dL     Total Cholesterol: 265 mg/dL

## 2023-02-08 NOTE — Assessment & Plan Note (Signed)
TFTs stable. Last saw endo 03/2021.

## 2023-02-08 NOTE — Assessment & Plan Note (Addendum)
Continues using Fleet enemas regularly - recommended against this.  Trial Linzess 145 mcg PRN, stop if abd pain develops.

## 2023-02-08 NOTE — Assessment & Plan Note (Signed)
Chronic, stable. Continue current regimen. 

## 2023-02-08 NOTE — Assessment & Plan Note (Signed)
Preventative protocols reviewed and updated unless pt declined. Discussed healthy diet and lifestyle.  

## 2023-02-08 NOTE — Assessment & Plan Note (Signed)
Advanced directive: doesn't have at home. HCPOA is son Kendra Jackson. Full code.

## 2023-02-08 NOTE — Assessment & Plan Note (Signed)
Continue PRN low dose PPI

## 2023-02-08 NOTE — Progress Notes (Signed)
Ph: [phone] Fax: [phone]   Patient ID: Kendra Jackson, female    DOB: 1960/09/18, 62 y.o.   MRN: [id number]  This visit was conducted in person.  BP 138/78   Pulse 75   Temp 98.3 F (36.8 C) (Oral)   Ht 5' 5.75" (1.67 m)   Wt 183 lb (83 kg)   SpO2 98%   BMI 29.76 kg/m    CC: CPE Subjective:   HPI: Kendra Jackson is a 62 y.o. female presenting on 02/08/2023 for Annual Exam   Sees endo Dr Lonzo Cloud for multinodular goiter. Thyroid US 03/2021 - R inferior thyroid nodule slightly smaller, previously biopsied. Given stability, no further [place] needed.   Preventative: Colonoscopy 06/2021 - WNL, rpt 5 yrs Tobi Bastos)  Well woman - remote h/o abnormal cells s/p cryotherapy. Pap smear 12/2017 WNL with cervical stenosis (absent endocervical zone), Pap February 17, 2021 WNL rpt 3 yrs.  LMP - 04/12/2009  Mammogram - 02/2022 - [facility] @ [place]. Dense breasts.H/o R inverted nipple.  Lung cancer screening - not eligible  Flu shot yearly  COVID vaccine Pfizer 06/2019, 07/2019, booster 06/2020, bivalent 12/2020 Tdap 2012-02-18, rpt today  Shingrix 12/2018, 03/2019 Advanced directive: doesn't have at home. HCPOA is son Cedrika Vaynshteyn. Full code.  Seat belt use discussed  Sunscreen use discussed. No changing moles on skin.  Non smoker  Alcohol - none  Dentist yearly Eye exam yearly  Bowel - chronic constipation manages with fleet enema 2-3d/wk. Occ abd pain, nausea, cramping. No h/o bowel obstruction.   Caffeine: rare   Lives alone, no pets. Grown son 29yo, grandson died suddenly at [employer] 17-Feb-2009 (drowning) - lots of stress from this.   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  Activity: no regular exercise despite having treadmill  Diet: good water, fruits daily, vegetables occasionally       Relevant past medical, surgical, family and social history reviewed and updated as indicated. Interim medical history since our last visit reviewed. Allergies and medications reviewed and  updated. Outpatient Medications Prior to Visit  Medication Sig Dispense Refill   aspirin EC 81 MG tablet Take 81 mg by mouth once a week. Swallow whole.     Cranberry 50 MG CHEW Chew by mouth daily.     Misc Natural Products (BEET ROOT PO) Take by mouth daily.     Multiple Vitamin (MULTIVITAMIN) tablet Take 1 tablet by mouth daily.     omeprazole (PRILOSEC) 20 MG capsule Take 1 capsule (20 mg total) by mouth daily as needed (GERD). 90 capsule 1   psyllium (HYDROCIL/METAMUCIL) 95 % PACK Take 1 packet by mouth daily.     valACYclovir (VALTREX) 500 MG tablet TAKE 1 TABLET (500 MG TOTAL) BY MOUTH 2 (TWO) TIMES DAILY. FOR 3 DAYS 30 tablet 1   atenolol (TENORMIN) 50 MG tablet Take 1 tablet (50 mg total) by mouth 2 (two) times daily. 180 tablet 3   Coenzyme Q10-Red Yeast Rice (CO Q-10 PLUS RED YEAST RICE PO) Take by mouth daily.     Red Yeast Rice 600 MG CAPS Take 1 capsule by mouth 2 (two) times daily.     No facility-administered medications prior to visit.     Per HPI unless specifically indicated in ROS section below Review of Systems  Constitutional:  Negative for activity change, appetite change, chills, fatigue, fever and unexpected weight change.  HENT:  Negative for hearing loss.   Eyes:  Negative for visual disturbance.  Respiratory:  Negative for cough,  chest tightness, shortness of breath and wheezing.   Cardiovascular:  Negative for chest pain, palpitations and leg swelling.  Gastrointestinal:  Positive for constipation. Negative for abdominal distention, abdominal pain, blood in stool, diarrhea, nausea and vomiting.  Genitourinary:  Negative for difficulty urinating and hematuria.  Musculoskeletal:  Negative for arthralgias, myalgias and neck pain.  Skin:  Negative for rash.  Neurological:  Positive for headaches (L sided intermittent). Negative for dizziness, seizures and syncope.  Hematological:  Negative for adenopathy. Does not bruise/bleed easily.  Psychiatric/Behavioral:   Negative for dysphoric mood. The patient is not nervous/anxious.     Objective:  BP 138/78   Pulse 75   Temp 98.3 F (36.8 C) (Oral)   Ht 5' 5.75" (1.67 m)   Wt 183 lb (83 kg)   SpO2 98%   BMI 29.76 kg/m   Wt Readings from Last 3 Encounters:  02/08/23 183 lb (83 kg)  11/30/22 179 lb (81.2 kg)  08/10/22 179 lb 6 oz (81.4 kg)      Physical Exam Vitals and nursing note reviewed.  Constitutional:      Appearance: Normal appearance. She is not ill-appearing.  HENT:     Head: Normocephalic and atraumatic.     Right Ear: Tympanic membrane, ear canal and external ear normal. There is no impacted cerumen.     Left Ear: Tympanic membrane, ear canal and external ear normal. There is no impacted cerumen.     Nose: Nose normal.     Mouth/Throat:     Mouth: Mucous membranes are moist.     Pharynx: Oropharynx is clear. No oropharyngeal exudate or posterior oropharyngeal erythema.  Eyes:     General:        Right eye: No discharge.        Left eye: No discharge.     Extraocular Movements: Extraocular movements intact.     Conjunctiva/sclera: Conjunctivae normal.     Pupils: Pupils are equal, round, and reactive to light.  Neck:     Thyroid: No thyroid mass or thyromegaly.     Vascular: No carotid bruit.  Cardiovascular:     Rate and Rhythm: Normal rate and regular rhythm.     Pulses: Normal pulses.     Heart sounds: Normal heart sounds. No murmur heard. Pulmonary:     Effort: Pulmonary effort is normal. No respiratory distress.     Breath sounds: Normal breath sounds. No wheezing, rhonchi or rales.  Abdominal:     General: Bowel sounds are normal. There is no distension.     Palpations: Abdomen is soft. There is no mass.     Tenderness: There is no abdominal tenderness. There is no guarding or rebound.     Hernia: No hernia is present.  Musculoskeletal:     Cervical back: Normal range of motion and neck supple. No rigidity.     Right lower leg: No edema.     Left lower leg: No  edema.  Lymphadenopathy:     Cervical: No cervical adenopathy.  Skin:    General: Skin is warm and dry.     Findings: No rash.  Neurological:     General: No focal deficit present.     Mental Status: She is alert. Mental status is at baseline.  Psychiatric:        Mood and Affect: Mood normal.        Behavior: Behavior normal.       Results for orders placed or performed in visit on  02/01/23  Lipoprotein A (LPA)  Result Value Ref Range   Lipoprotein (a) 377 (H) <75 nmol/L  T3  Result Value Ref Range   T3, Total 118 76 - 181 ng/dL  T4, free  Result Value Ref Range   Free T4 0.67 0.60 - 1.60 ng/dL  TSH  Result Value Ref Range   TSH 0.69 0.35 - 5.50 uIU/mL  Lipid panel  Result Value Ref Range   Cholesterol 265 (H) 0 - 200 mg/dL   Triglycerides 951.8 0.0 - 149.0 mg/dL   HDL 84.16 >60.63 mg/dL   VLDL 01.6 0.0 - 01.0 mg/dL   LDL Cholesterol 932 (H) 0 - 99 mg/dL   Total CHOL/HDL Ratio 4    NonHDL 190.77   Comprehensive metabolic panel  Result Value Ref Range   Sodium 142 135 - 145 mEq/L   Potassium 3.9 3.5 - 5.1 mEq/L   Chloride 105 96 - 112 mEq/L   CO2 29 19 - 32 mEq/L   Glucose, Bld 92 70 - 99 mg/dL   BUN 12 6 - 23 mg/dL   Creatinine, Ser 3.55 0.40 - 1.20 mg/dL   Total Bilirubin 0.4 0.2 - 1.2 mg/dL   Alkaline Phosphatase 87 39 - 117 U/L   AST 14 0 - 37 U/L   ALT 12 0 - 35 U/L   Total Protein 7.4 6.0 - 8.3 g/dL   Albumin 4.4 3.5 - 5.2 g/dL   GFR 73.22 >02.54 mL/min   Calcium 9.4 8.4 - 10.5 mg/dL  Urinalysis, Routine w reflex microscopic  Result Value Ref Range   Color, Urine YELLOW Yellow;Lt. Yellow;Straw;Dark Yellow;Amber;Green;Red;Brown   APPearance CLEAR Clear;Turbid;Slightly Cloudy;Cloudy   Specific Gravity, Urine 1.015 1.000 - 1.030   pH 6.0 5.0 - 8.0   Total Protein, Urine NEGATIVE Negative   Urine Glucose NEGATIVE Negative   Ketones, ur NEGATIVE Negative   Bilirubin Urine NEGATIVE Negative   Hgb urine dipstick NEGATIVE Negative   Urobilinogen, UA 0.2  0.0 - 1.0   Leukocytes,Ua NEGATIVE Negative   Nitrite NEGATIVE Negative   WBC, UA none seen 0-2/hpf   RBC / HPF none seen 0-2/hpf    Assessment & Plan:   Problem List Items Addressed This Visit     Health maintenance examination - Primary (Chronic)    Preventative protocols reviewed and updated unless pt declined. Discussed healthy diet and lifestyle.       Advanced directives, counseling/discussion (Chronic)    Advanced directive: doesn't have at home. HCPOA is son Davonna Round. Full code.       HTN (hypertension)    Chronic, stable. Continue current regimen.       Relevant Medications   atenolol (TENORMIN) 50 MG tablet   rosuvastatin (CRESTOR) 10 MG tablet   Abnormal large bowel motility    Continues using Fleet enemas regularly - recommended against this.  Trial Linzess 145 mcg PRN, stop if abd pain develops.       Multinodular goiter    Has seen endo for this.  Latest thyroid US 03/2021 reassuring - likely no need to continue monitoring      Relevant Medications   atenolol (TENORMIN) 50 MG tablet   HLD (hyperlipidemia)    Chronic, deteriorated despite RYR BID with elevated Lp(a) to 377 Agrees to start rosuvastatin 10mg  daily. Agrees to proceed with coronary CT with CAC.  The 10-year ASCVD risk score (Arnett DK, et al., 2019) is: 12.2%   Values used to calculate the score:     Age:  62 years     Sex: Female     Is Non-Hispanic African American: Yes     Diabetic: No     Tobacco smoker: No     Systolic Blood Pressure: 138 mmHg     Is BP treated: Yes     HDL Cholesterol: 74.7 mg/dL     Total Cholesterol: 265 mg/dL       Relevant Medications   atenolol (TENORMIN) 50 MG tablet   rosuvastatin (CRESTOR) 10 MG tablet   Other Relevant Orders   CT CARDIAC SCORING (SELF PAY ONLY)   Subclinical hyperthyroidism    TFTs stable. Last saw endo 03/2021.       Relevant Medications   atenolol (TENORMIN) 50 MG tablet   Reflux esophagitis    Continue PRN low dose  PPI      Other Visit Diagnoses     Encounter for immunization       Relevant Orders   Tdap vaccine greater than or equal to 7yo IM (Completed)        Meds ordered this encounter  Medications   DISCONTD: Red Yeast Rice 600 MG CAPS    Sig: Take 1 capsule (600 mg total) by mouth in the morning and at bedtime. With Niacin 50mg  and CoEnzyme Q10 30mg    atenolol (TENORMIN) 50 MG tablet    Sig: Take 1 tablet (50 mg total) by mouth 2 (two) times daily.    Dispense:  180 tablet    Refill:  4   linaclotide (LINZESS) 145 MCG CAPS capsule    Sig: Take 1 capsule (145 mcg total) by mouth daily before breakfast.    Dispense:  30 capsule    Refill:  3   rosuvastatin (CRESTOR) 10 MG tablet    Sig: Take 1 tablet (10 mg total) by mouth daily.    Dispense:  90 tablet    Refill:  4    Orders Placed This Encounter  Procedures   CT CARDIAC SCORING (SELF PAY ONLY)    Standing Status:   Future    Standing Expiration Date:   02/08/2024    Order Specific Question:   Preferred imaging location?    Answer:   [facility]   Tdap vaccine greater than or equal to 7yo IM    Patient Instructions  Tdap today  Call to schedule mammogram at your convenience: [facility] at [facility] [phone] May try Linzess as needed for constipation. If abdominal pain develops, stop it.  Start Crestor (rosuvastatin) 10mg  nightly in place of red yeast rice.  We will also order coronary CT with calcium score at [facility].  Schedule lab visit in 3 months to recheck cholesterol levels on new medicine. Return in 1 year for next physical or Welcome to Medicare visit.   Follow up plan: Return in about 1 year (around 02/08/2024) for annual exam, prior fasting for blood work.  Eustaquio Boyden, MD

## 2023-02-08 NOTE — Assessment & Plan Note (Signed)
Has seen endo for this.  Latest thyroid US 03/2021 reassuring - likely no need to continue monitoring

## 2023-02-08 NOTE — Patient Instructions (Addendum)
Tdap today  Call to schedule mammogram at your convenience: [facility] at [facility] [phone] May try Linzess as needed for constipation. If abdominal pain develops, stop it.  Start Crestor (rosuvastatin) 10mg  nightly in place of red yeast rice.  We will also order coronary CT with calcium score at [place].  Schedule lab visit in 3 months to recheck cholesterol levels on new medicine. Return in 1 year for next physical or Welcome to Medicare visit.

## 2023-02-13 ENCOUNTER — Other Ambulatory Visit: Payer: Self-pay | Admitting: Family Medicine

## 2023-02-13 DIAGNOSIS — Z1231 Encounter for screening mammogram for malignant neoplasm of breast: Secondary | ICD-10-CM

## 2023-02-15 ENCOUNTER — Ambulatory Visit
Admission: RE | Admit: 2023-02-15 | Discharge: 2023-02-15 | Disposition: A | Discharge status: Home / Self Care | Payer: Federal, State, Local not specified - PPO | Source: Ambulatory Visit | Attending: Family Medicine | Admitting: Family Medicine

## 2023-02-15 DIAGNOSIS — E7841 Elevated Lipoprotein(a): Secondary | ICD-10-CM | POA: Insufficient documentation

## 2023-02-20 ENCOUNTER — Telehealth: Payer: Self-pay

## 2023-02-20 NOTE — Telephone Encounter (Signed)
Spoke with pt relaying Dr. G's message. Pt verbalizes understanding.  

## 2023-02-20 NOTE — Telephone Encounter (Signed)
Pt asks is it ok for her to take beet root supplement. Plz advise.

## 2023-02-20 NOTE — Telephone Encounter (Addendum)
Ok to take beet roots as they're rich in antioxidants and may lower BP however caution it can increase risk of kidney stones and gout.

## 2023-03-04 ENCOUNTER — Other Ambulatory Visit: Payer: Self-pay | Admitting: Family Medicine

## 2023-03-04 ENCOUNTER — Encounter: Payer: Self-pay | Admitting: *Deleted

## 2023-03-04 DIAGNOSIS — N2889 Other specified disorders of kidney and ureter: Secondary | ICD-10-CM | POA: Insufficient documentation

## 2023-03-05 ENCOUNTER — Telehealth: Payer: Self-pay

## 2023-03-05 ENCOUNTER — Other Ambulatory Visit (HOSPITAL_COMMUNITY): Payer: Self-pay

## 2023-03-05 NOTE — Telephone Encounter (Signed)
Pharmacy Patient Advocate Encounter   Received notification from CoverMyMeds that prior authorization for Linzess capsules is required/requested.   Insurance verification completed.   The patient is insured through [facility] .   Prior Authorization for Linzess capsules has been APPROVED from 03-05-2023 to 03-04-2024. Ran test claim, Copay is $35.00 for 90 day supply. This test claim was processed through [facility]- copay amounts may vary at other pharmacies due to pharmacy/plan contracts, or as the patient moves through the different stages of their insurance plan.   PA #/Case ID/Reference #: Hortense Ramal

## 2023-03-08 ENCOUNTER — Ambulatory Visit
Admission: RE | Admit: 2023-03-08 | Discharge: 2023-03-08 | Disposition: A | Discharge status: Home / Self Care | Payer: Federal, State, Local not specified - PPO | Source: Ambulatory Visit | Attending: Family Medicine | Admitting: Family Medicine

## 2023-03-08 DIAGNOSIS — N2889 Other specified disorders of kidney and ureter: Secondary | ICD-10-CM | POA: Diagnosis not present

## 2023-03-08 DIAGNOSIS — N289 Disorder of kidney and ureter, unspecified: Secondary | ICD-10-CM | POA: Diagnosis not present

## 2023-03-08 MED ORDER — GADOBUTROL 1 MMOL/ML IV SOLN
7.5000 mL | Freq: Once | INTRAVENOUS | Status: AC | PRN
  Administered 2023-03-08: 7.5 mL via INTRAVENOUS

## 2023-03-12 ENCOUNTER — Ambulatory Visit
Admission: RE | Admit: 2023-03-12 | Discharge: 2023-03-12 | Disposition: A | Discharge status: Home / Self Care | Payer: Federal, State, Local not specified - PPO | Source: Ambulatory Visit | Attending: Family Medicine | Admitting: Family Medicine

## 2023-03-12 DIAGNOSIS — Z1231 Encounter for screening mammogram for malignant neoplasm of breast: Secondary | ICD-10-CM | POA: Insufficient documentation

## 2023-05-12 ENCOUNTER — Other Ambulatory Visit: Payer: Self-pay | Admitting: Family Medicine

## 2023-05-12 DIAGNOSIS — E785 Hyperlipidemia, unspecified: Secondary | ICD-10-CM

## 2023-05-13 ENCOUNTER — Other Ambulatory Visit (INDEPENDENT_AMBULATORY_CARE_PROVIDER_SITE_OTHER): Payer: Federal, State, Local not specified - PPO

## 2023-05-13 DIAGNOSIS — E785 Hyperlipidemia, unspecified: Secondary | ICD-10-CM | POA: Diagnosis not present

## 2023-05-13 LAB — LIPID PANEL
Cholesterol: 265 mg/dL — ABNORMAL HIGH (ref 0–200)
HDL: 79.8 mg/dL (ref >39.00)
LDL Cholesterol: 162 mg/dL — ABNORMAL HIGH (ref 0–99)
NonHDL: 184.73
Total CHOL/HDL Ratio: 3
Triglycerides: 112 mg/dL (ref 0.0–149.0)
VLDL: 22.4 mg/dL (ref 0.0–40.0)

## 2023-05-13 LAB — HEPATIC FUNCTION PANEL
ALT: 14 U/L (ref 0–35)
AST: 16 U/L (ref 0–37)
Albumin: 4.5 g/dL (ref 3.5–5.2)
Alkaline Phosphatase: 78 U/L (ref 39–117)
Bilirubin, Direct: 0.1 mg/dL (ref 0.0–0.3)
Total Bilirubin: 0.4 mg/dL (ref 0.2–1.2)
Total Protein: 7.9 g/dL (ref 6.0–8.3)

## 2023-05-13 LAB — CK: Total CK: 71 U/L (ref 7–177)

## 2023-05-16 ENCOUNTER — Encounter: Payer: Self-pay | Admitting: Family Medicine

## 2023-05-16 DIAGNOSIS — E7841 Elevated Lipoprotein(a): Secondary | ICD-10-CM

## 2023-10-25 ENCOUNTER — Encounter: Payer: Self-pay | Admitting: Family Medicine

## 2023-10-25 ENCOUNTER — Ambulatory Visit (INDEPENDENT_AMBULATORY_CARE_PROVIDER_SITE_OTHER): Admitting: Family Medicine

## 2023-10-25 VITALS — BP 146/92 | HR 70 | Temp 98.4°F | Ht 65.75 in | Wt 180.1 lb

## 2023-10-25 DIAGNOSIS — R3989 Other symptoms and signs involving the genitourinary system: Secondary | ICD-10-CM | POA: Diagnosis not present

## 2023-10-25 DIAGNOSIS — R8290 Unspecified abnormal findings in urine: Secondary | ICD-10-CM

## 2023-10-25 DIAGNOSIS — I10 Essential (primary) hypertension: Secondary | ICD-10-CM

## 2023-10-25 DIAGNOSIS — E7841 Elevated Lipoprotein(a): Secondary | ICD-10-CM | POA: Diagnosis not present

## 2023-10-25 LAB — POC URINALSYSI DIPSTICK (AUTOMATED)
Bilirubin, UA: NEGATIVE
Blood, UA: NEGATIVE
Glucose, UA: NEGATIVE
Ketones, UA: NEGATIVE
Leukocytes, UA: NEGATIVE
Nitrite, UA: NEGATIVE
Protein, UA: NEGATIVE
Spec Grav, UA: 1.02 (ref 1.010–1.025)
Urobilinogen, UA: 0.2 U/dL
pH, UA: 6 (ref 5.0–8.0)

## 2023-10-25 NOTE — Progress Notes (Signed)
 Ph: [phone] Fax: [phone]   Patient ID: Kendra Jackson, female    DOB: 24-Apr-1960, 63 y.o.   MRN: [id number]  This visit was conducted in person.  BP (!) 146/92 (BP Location: Left Arm, Patient Position: Sitting, Cuff Size: Normal)   Pulse 70   Temp 98.4 F (36.9 C) (Oral)   Ht 5' 5.75 (1.67 m)   Wt 180 lb 2 oz (81.7 kg)   SpO2 100%   BMI 29.29 kg/m   BP Readings from Last 3 Encounters:  10/25/23 (!) 146/92  02/08/23 138/78  11/30/22 134/82  R arm 160/100s  L arm 158/100  CC: bubbles in urine  Subjective:   HPI: Kendra Jackson is a 63 y.o. female presenting on 10/25/2023 for possible UTI (Urine has Bubble it in)   Several months of bubbles in urine.  No fevers/chills, dysuria, urgency/frequency, hematuria, abd pain flank pain , nausea/vomiting.   She stays worried because she has strong fmhx kidney disease - 39yo nephew ESRD on HD, also cousin passed away recently she had kidney transplant, other second cousin on dialysis.   She stopped Crestor  - after calcium  score returned at zero. She is back on RYR BID.   Denies HA, vision changes, CP/tightness, SOB, leg swelling.  Denies recent decongestant or NSAID use.   MRI 02/2023: Benign hemorrhagic or proteinaceous cyst arising from the posterior superior pole of the left kidney, for which no further follow-up or characterization is required.      Relevant past medical, surgical, family and social history reviewed and updated as indicated. Interim medical history since our last visit reviewed. Allergies and medications reviewed and updated. Outpatient Medications Prior to Visit  Medication Sig Dispense Refill   aspirin EC 81 MG tablet Take 81 mg by mouth once a week. Swallow whole.     atenolol  (TENORMIN ) 50 MG tablet Take 1 tablet (50 mg total) by mouth 2 (two) times daily. 180 tablet 4   MAGNESIUM PO Take 1 capsule by mouth daily.     Multiple Vitamin (MULTIVITAMIN) tablet Take 1 tablet by mouth daily.      omeprazole  (PRILOSEC) 20 MG capsule Take 1 capsule (20 mg total) by mouth daily as needed (GERD). 90 capsule 1   psyllium (HYDROCIL/METAMUCIL) 95 % PACK Take 1 packet by mouth daily.     Red Yeast Rice Extract (RED YEAST RICE PO) Take 1 tablet by mouth in the morning and at bedtime.     valACYclovir  (VALTREX ) 500 MG tablet TAKE 1 TABLET (500 MG TOTAL) BY MOUTH 2 (TWO) TIMES DAILY. FOR 3 DAYS 30 tablet 1   Cranberry 50 MG CHEW Chew by mouth daily.     linaclotide  (LINZESS ) 145 MCG CAPS capsule Take 1 capsule (145 mcg total) by mouth daily before breakfast. 30 capsule 3   Misc Natural Products (BEET ROOT PO) Take by mouth daily.     No facility-administered medications prior to visit.     Per HPI unless specifically indicated in ROS section below Review of Systems  Objective:  BP (!) 146/92 (BP Location: Left Arm, Patient Position: Sitting, Cuff Size: Normal)   Pulse 70   Temp 98.4 F (36.9 C) (Oral)   Ht 5' 5.75 (1.67 m)   Wt 180 lb 2 oz (81.7 kg)   SpO2 100%   BMI 29.29 kg/m   Wt Readings from Last 3 Encounters:  10/25/23 180 lb 2 oz (81.7 kg)  02/08/23 183 lb (83 kg)  11/30/22 179 lb (81.2 kg)  Physical Exam Vitals and nursing note reviewed.  Constitutional:      Appearance: Normal appearance. She is not ill-appearing.  HENT:     Head: Normocephalic and atraumatic.     Mouth/Throat:     Mouth: Mucous membranes are moist.     Pharynx: Oropharynx is clear. No oropharyngeal exudate or posterior oropharyngeal erythema.  Eyes:     Extraocular Movements: Extraocular movements intact.     Conjunctiva/sclera: Conjunctivae normal.     Pupils: Pupils are equal, round, and reactive to light.  Neck:     Thyroid : No thyroid  mass or thyromegaly.  Cardiovascular:     Rate and Rhythm: Normal rate and regular rhythm.     Pulses: Normal pulses.     Heart sounds: Normal heart sounds. No murmur heard. Pulmonary:     Effort: Pulmonary effort is normal. No respiratory distress.      Breath sounds: Normal breath sounds. No wheezing, rhonchi or rales.  Abdominal:     General: Bowel sounds are normal. There is no distension.     Palpations: Abdomen is soft. There is no mass.     Tenderness: There is no abdominal tenderness. There is no right CVA tenderness, left CVA tenderness, guarding or rebound.     Hernia: No hernia is present.  Musculoskeletal:     Cervical back: Normal range of motion and neck supple.     Right lower leg: No edema.     Left lower leg: No edema.  Skin:    General: Skin is warm and dry.     Findings: No rash.  Neurological:     Mental Status: She is alert.  Psychiatric:        Mood and Affect: Mood normal.        Behavior: Behavior normal.       Results for orders placed or performed in visit on 10/25/23  POCT Urinalysis Dipstick (Automated)   Collection Time: 10/25/23  2:18 PM  Result Value Ref Range   Color, UA Yellow    Clarity, UA Clear    Glucose, UA Negative Negative   Bilirubin, UA Negatve    Ketones, UA Negative    Spec Grav, UA 1.020 1.010 - 1.025   Blood, UA Negative    pH, UA 6.0 5.0 - 8.0   Protein, UA Negative Negative   Urobilinogen, UA 0.2 0.2 or 1.0 E.U./dL   Nitrite, UA Negative    Leukocytes, UA Negative Negative   Lab Results  Component Value Date   NA 142 02/01/2023   CL 105 02/01/2023   K 3.9 02/01/2023   CO2 29 02/01/2023   BUN 12 02/01/2023   CREATININE 0.87 02/01/2023   GFR 70.49 02/01/2023   CALCIUM  9.4 02/01/2023   ALBUMIN 4.5 05/13/2023   GLUCOSE 92 02/01/2023     Assessment & Plan:   Problem List Items Addressed This Visit     HTN (hypertension)   BP staying elevated despite atenolol  50mg  bid - rec DASH diet, reviewed other diet and lifestyle choices to improve blood pressure control.  She will buy arm cuff to start monitoring at home BP log sheet provided today      HLD (hyperlipidemia)   Pneumaturia - Primary   UA reassuringly today normal.  Pt otherwise asymptomatic. Will continue to  monitor.       Other Visit Diagnoses       Abnormal finding in urine       Relevant Orders   POCT Urinalysis Dipstick (  Automated) (Completed)        No orders of the defined types were placed in this encounter.   Orders Placed This Encounter  Procedures   POCT Urinalysis Dipstick (Automated)    Patient Instructions  BP was too high Urine looked ok  Your goal blood pressure is <140/90.  Start monitoring BP at hom - log sheet provided today. Buy automatic arm blood pressure cuff to monitor at home.  Work on low salt/sodium diet - goal <2 grams (2,000mg ) per day. Eat a diet high in fruits/vegetables and whole grains.  Look into mediterranean and DASH diet. Goal activity is 176min/wk of moderate intensity exercise.  This can be split into 30 minute chunks.  If you are not at this level, you can start with smaller 10-15 min increments and slowly build up activity. Look at www.heart.org for more resources   Return in 6 weeks for hypertension follow up visit   Follow up plan: Return in about 6 weeks (around 12/06/2023) for follow up visit.  Anton Blas, MD

## 2023-10-25 NOTE — Patient Instructions (Addendum)
 BP was too high Urine looked ok  Your goal blood pressure is <140/90.  Start monitoring BP at hom - log sheet provided today. Buy automatic arm blood pressure cuff to monitor at home.  Work on low salt/sodium diet - goal <2 grams (2,000mg ) per day. Eat a diet high in fruits/vegetables and whole grains.  Look into mediterranean and DASH diet. Goal activity is 150min/wk of moderate intensity exercise.  This can be split into 30 minute chunks.  If you are not at this level, you can start with smaller 10-15 min increments and slowly build up activity. Look at www.heart.org for more resources   Return in 6 weeks for hypertension follow up visit

## 2023-10-25 NOTE — Assessment & Plan Note (Addendum)
 UA reassuringly today normal.  Pt otherwise asymptomatic. Will continue to monitor.

## 2023-10-25 NOTE — Assessment & Plan Note (Signed)
 BP staying elevated despite atenolol  50mg  bid - rec DASH diet, reviewed other diet and lifestyle choices to improve blood pressure control.  She will buy arm cuff to start monitoring at home BP log sheet provided today

## 2023-10-28 ENCOUNTER — Ambulatory Visit: Payer: Self-pay

## 2023-10-28 ENCOUNTER — Ambulatory Visit (INDEPENDENT_AMBULATORY_CARE_PROVIDER_SITE_OTHER): Admitting: Family Medicine

## 2023-10-28 ENCOUNTER — Encounter: Payer: Self-pay | Admitting: Family Medicine

## 2023-10-28 VITALS — BP 140/82 | HR 68 | Temp 97.5°F | Ht 65.75 in | Wt 182.4 lb

## 2023-10-28 DIAGNOSIS — L509 Urticaria, unspecified: Secondary | ICD-10-CM

## 2023-10-28 DIAGNOSIS — R238 Other skin changes: Secondary | ICD-10-CM

## 2023-10-28 DIAGNOSIS — M7989 Other specified soft tissue disorders: Secondary | ICD-10-CM | POA: Diagnosis not present

## 2023-10-28 MED ORDER — CEPHALEXIN 500 MG PO CAPS: 1000.0000 mg | ORAL_CAPSULE | Freq: Two times a day (BID) | ORAL | 0 refills | Status: DC

## 2023-10-28 MED ORDER — FLUCONAZOLE 150 MG PO TABS: ORAL_TABLET | ORAL | 0 refills | Status: DC

## 2023-10-28 NOTE — Progress Notes (Signed)
 [name], MD, CAQ Sports Medicine [facility] [address] [place], [address]  Phone: [phone]  FAX: [phone]  Kendra Jackson - 63 y.o. female  MRN [id number]  Date of Birth: 07-10-1960  Date: 10/28/2023  PCP: Rilla Baller, MD  Referral: Rilla Baller, MD  Chief Complaint  Patient presents with   Rash    Left Upper Thigh   Subjective:   Kendra Jackson is a 63 y.o. very pleasant female patient with Body mass index is 29.66 kg/m. who presents with the following:  Patient shows me a series of pictures on her left lateral upper thigh.  Earlier this morning she had some smaller relative areas that were raised with a elevated border.  Over time, these have become more confluent, yet they are still elevated, and now at this point she has some relatively extensive redness around this  Leg redness - does not hurt  She does not have any other systemic symptoms including fever No sweats or chills  Review of Systems is noted in the HPI, as appropriate  Objective:   BP (!) 140/82   Pulse 68   Temp (!) 97.5 F (36.4 C) (Temporal)   Ht 5' 5.75 (1.67 m)   Wt 182 lb 6 oz (82.7 kg)   SpO2 97%   BMI 29.66 kg/m   GEN: No acute distress; alert,appropriate. PULM: Breathing comfortably in no respiratory distress PSYCH: Normally interactive.   Left lateral thigh is elevated with a clear border.  There is no scale.  There is some redness but no fluctuance or induration.  Laboratory and Imaging Data:  Assessment and Plan:     ICD-10-CM   1. Redness and swelling of lower leg  M79.89    R23.8     2. Urticaria  L50.9      Redness and swelling at the left lateral thigh.  I think that there is an allergic component to this, however with the redness concern for early developing cellulitis.  Will plan to treat this with antihistamines and antibiotics.  Patient Instructions  Take anti-histamine.   Certrizine  10 mg, 1 tablet a day  And at NIGHT - take benadryl 25 mg tablets, 2 tablets at night   Medication Management during today's office visit: Meds ordered this encounter  Medications   cephALEXin  (KEFLEX ) 500 MG capsule    Sig: Take 2 capsules (1,000 mg total) by mouth 2 (two) times daily.    Dispense:  40 capsule    Refill:  0   fluconazole  (DIFLUCAN ) 150 MG tablet    Sig: Take 1 tab po today, repeat in 7 days if needed    Dispense:  2 tablet    Refill:  0   There are no discontinued medications.  Orders placed today for conditions managed today: No orders of the defined types were placed in this encounter.   Disposition: No follow-ups on file.  [employer] One speech-to-text software was used for transcription in this dictation.  Possible transcriptional errors can occur using Animal nutritionist.   Signed,  Jacques DASEN. Austin Pongratz, MD   Outpatient Encounter Medications as of 10/28/2023  Medication Sig   aspirin EC 81 MG tablet Take 81 mg by mouth once a week. Swallow whole.   atenolol  (TENORMIN ) 50 MG tablet Take 1 tablet (50 mg total) by mouth 2 (two) times daily.   cephALEXin  (KEFLEX ) 500 MG capsule Take 2 capsules (1,000 mg total) by mouth 2 (two)  times daily.   fluconazole  (DIFLUCAN ) 150 MG tablet Take 1 tab po today, repeat in 7 days if needed   MAGNESIUM PO Take 1 capsule by mouth daily.   Multiple Vitamin (MULTIVITAMIN) tablet Take 1 tablet by mouth daily.   omeprazole  (PRILOSEC) 20 MG capsule Take 1 capsule (20 mg total) by mouth daily as needed (GERD).   psyllium (HYDROCIL/METAMUCIL) 95 % PACK Take 1 packet by mouth daily.   Red Yeast Rice Extract (RED YEAST RICE PO) Take 1 tablet by mouth in the morning and at bedtime.   valACYclovir  (VALTREX ) 500 MG tablet TAKE 1 TABLET (500 MG TOTAL) BY MOUTH 2 (TWO) TIMES DAILY. FOR 3 DAYS   No facility-administered encounter medications on file as of 10/28/2023.

## 2023-10-28 NOTE — Telephone Encounter (Signed)
 FYI Only or Action Required?: Action required by provider: request for appointment.  Patient was last seen in primary care on 10/25/2023 by Rilla Baller, MD.  Called Nurse Triage reporting Rash.  Symptoms began yesterday.  Interventions attempted: OTC medications: antibiotic ointment .  Symptoms are: unchanged.  Triage Disposition: See PCP When Office is Open (Within 3 Days)  Patient/caregiver understands and will follow disposition?: YesCopied from CRM [id number]. Topic: Clinical - Red Word Triage >> Oct 28, 2023  7:46 AM Donna BRAVO wrote: Red Word that prompted transfer to Nurse Triage: patient, 10 to 12 small bumps sized of a dime and big as a quarter left thigh outside,  started itching yesterday and is getting worse, red, Reason for Disposition  Mild widespread rash  (Exception: Heat rash lasting 3 days or less.)  Answer Assessment - Initial Assessment Questions It feels like I'm rubbing a bag a rocks. I was outside yesterday at a party but didn't see bugs. I'm not going to take any Benadryl for the itching because I want the dr to see the rash as it is. Pt has tried some triple abx ointment.     1. APPEARANCE of RASH: What does the rash look like? (e.g., blisters, dry flaky skin, red spots, redness, sores)     Skin color, visible lumps 2. SIZE: How big are the spots? (e.g., tip of pen, eraser, coin; inches, centimeters)     Dime to quarter 3. LOCATION: Where is the rash located?     Left leg-back from buttocks to knee 4. COLOR: What color is the rash? (Note: It is difficult to assess rash color in people with darker-colored skin. When this situation occurs, simply ask the caller to describe what they see.)     No color 5. ONSET: When did the rash begin?     yesterday 6. FEVER: Do you have a fever? If Yes, ask: What is your temperature, how was it measured, and when did it start?     denies 7. ITCHING: Does the rash itch? If Yes, ask: How bad is the itch?  (Scale 1-10; or mild, moderate, severe)     Mild-moderate 8. CAUSE: What do you think is causing the rash?     Not sure 9. MEDICINE FACTORS: Have you started any new medicines within the last 2 weeks? (e.g., antibiotics)      denies 10. OTHER SYMPTOMS: Do you have any other symptoms? (e.g., dizziness, headache, sore throat, joint pain)       denies  Protocols used: Rash or Redness - [facility]

## 2023-10-28 NOTE — Patient Instructions (Signed)
 Take anti-histamine.   Certrizine 10 mg, 1 tablet a day  And at NIGHT - take benadryl 25 mg tablets, 2 tablets at night

## 2023-12-06 ENCOUNTER — Ambulatory Visit: Admitting: Family Medicine

## 2023-12-25 ENCOUNTER — Ambulatory Visit (INDEPENDENT_AMBULATORY_CARE_PROVIDER_SITE_OTHER)

## 2023-12-25 DIAGNOSIS — Z23 Encounter for immunization: Secondary | ICD-10-CM

## 2024-01-28 ENCOUNTER — Other Ambulatory Visit: Payer: Self-pay | Admitting: Family Medicine

## 2024-01-28 DIAGNOSIS — I10 Essential (primary) hypertension: Secondary | ICD-10-CM

## 2024-01-28 DIAGNOSIS — E0590 Thyrotoxicosis, unspecified without thyrotoxic crisis or storm: Secondary | ICD-10-CM

## 2024-01-28 DIAGNOSIS — E7841 Elevated Lipoprotein(a): Secondary | ICD-10-CM

## 2024-02-03 ENCOUNTER — Other Ambulatory Visit: Payer: Federal, State, Local not specified - PPO

## 2024-02-03 DIAGNOSIS — E0590 Thyrotoxicosis, unspecified without thyrotoxic crisis or storm: Secondary | ICD-10-CM | POA: Diagnosis not present

## 2024-02-03 DIAGNOSIS — I10 Essential (primary) hypertension: Secondary | ICD-10-CM | POA: Diagnosis not present

## 2024-02-03 DIAGNOSIS — E7841 Elevated Lipoprotein(a): Secondary | ICD-10-CM

## 2024-02-03 LAB — COMPREHENSIVE METABOLIC PANEL WITH GFR
ALT: 12 U/L (ref 0–35)
AST: 15 U/L (ref 0–37)
Albumin: 4.5 g/dL (ref 3.5–5.2)
Alkaline Phosphatase: 83 U/L (ref 39–117)
BUN: 17 mg/dL (ref 6–23)
CO2: 24 meq/L (ref 19–32)
Calcium: 9.4 mg/dL (ref 8.4–10.5)
Chloride: 105 meq/L (ref 96–112)
Creatinine, Ser: 0.92 mg/dL (ref 0.40–1.20)
GFR: 65.46 mL/min (ref >60.00)
Glucose, Bld: 98 mg/dL (ref 70–99)
Potassium: 3.8 meq/L (ref 3.5–5.1)
Sodium: 143 meq/L (ref 135–145)
Total Bilirubin: 0.3 mg/dL (ref 0.2–1.2)
Total Protein: 7.5 g/dL (ref 6.0–8.3)

## 2024-02-03 LAB — LIPID PANEL
Cholesterol: 262 mg/dL — ABNORMAL HIGH (ref 0–200)
HDL: 73.2 mg/dL (ref >39.00)
LDL Cholesterol: 166 mg/dL — ABNORMAL HIGH (ref 0–99)
NonHDL: 189.04
Total CHOL/HDL Ratio: 4
Triglycerides: 113 mg/dL (ref 0.0–149.0)
VLDL: 22.6 mg/dL (ref 0.0–40.0)

## 2024-02-03 LAB — T4, FREE: Free T4: 0.75 ng/dL (ref 0.60–1.60)

## 2024-02-03 LAB — MICROALBUMIN / CREATININE URINE RATIO
Creatinine,U: 92.5 mg/dL
Microalb Creat Ratio: 58.2 mg/g — ABNORMAL HIGH (ref 0.0–30.0)
Microalb, Ur: 5.4 mg/dL — ABNORMAL HIGH (ref 0.0–1.9)

## 2024-02-03 LAB — TSH: TSH: 1.14 u[IU]/mL (ref 0.35–5.50)

## 2024-02-04 ENCOUNTER — Ambulatory Visit: Payer: Self-pay | Admitting: Family Medicine

## 2024-02-04 LAB — T3: T3, Total: 91 ng/dL (ref 76–181)

## 2024-02-10 ENCOUNTER — Ambulatory Visit: Payer: Federal, State, Local not specified - PPO | Admitting: Family Medicine

## 2024-02-10 ENCOUNTER — Encounter: Payer: Self-pay | Admitting: Family Medicine

## 2024-02-10 VITALS — BP 156/86 | HR 75 | Temp 98.2°F | Ht 66.5 in | Wt 185.0 lb

## 2024-02-10 DIAGNOSIS — I10 Essential (primary) hypertension: Secondary | ICD-10-CM | POA: Diagnosis not present

## 2024-02-10 DIAGNOSIS — Z23 Encounter for immunization: Secondary | ICD-10-CM

## 2024-02-10 DIAGNOSIS — E7841 Elevated Lipoprotein(a): Secondary | ICD-10-CM | POA: Diagnosis not present

## 2024-02-10 DIAGNOSIS — Z7189 Other specified counseling: Secondary | ICD-10-CM

## 2024-02-10 DIAGNOSIS — K2100 Gastro-esophageal reflux disease with esophagitis, without bleeding: Secondary | ICD-10-CM

## 2024-02-10 DIAGNOSIS — E0590 Thyrotoxicosis, unspecified without thyrotoxic crisis or storm: Secondary | ICD-10-CM | POA: Diagnosis not present

## 2024-02-10 DIAGNOSIS — Z0000 Encounter for general adult medical examination without abnormal findings: Secondary | ICD-10-CM

## 2024-02-10 MED ORDER — RED YEAST RICE 600 MG PO CAPS: 1.0000 | ORAL_CAPSULE | Freq: Two times a day (BID) | ORAL | Status: AC

## 2024-02-10 MED ORDER — OMEPRAZOLE 40 MG PO CPDR: 40.0000 mg | DELAYED_RELEASE_CAPSULE | Freq: Every day | ORAL | 1 refills | Status: AC

## 2024-02-10 MED ORDER — LINACLOTIDE 145 MCG PO CAPS: 145.0000 ug | ORAL_CAPSULE | Freq: Every day | ORAL | 3 refills | Status: AC

## 2024-02-10 MED ORDER — ATENOLOL 50 MG PO TABS: 50.0000 mg | ORAL_TABLET | Freq: Two times a day (BID) | ORAL | 3 refills | Status: AC

## 2024-02-10 NOTE — Progress Notes (Signed)
 Chief Complaint  Patient presents with   Medicare Wellness    Welcome to medicare      Subjective:   Kendra Jackson is a 63 y.o. female who presents for a Welcome to Medicare Exam.    Preventative: Colonoscopy 06/2021 - WNL, rpt 5 yrs Romero)  Well woman - remote h/o abnormal cells s/p cryotherapy. Pap smear 12/2017 WNL with cervical stenosis (absent endocervical zone), Pap 02-27-21 WNL rpt 3 yrs - will update next year.  LMP - 04/12/2009  Mammogram - 02/2023 - [facility] @ [place]. Dense breasts.H/o R inverted nipple.  Lung cancer screening - not eligible  Flu shot yearly  COVID vaccine Pfizer 06/2019, 07/2019, booster 06/2020, bivalent 12/2020 Prevnar-20 - today Tdap 2013, 02-28-23 Shingrix  12/2018, 03/2019 Advanced directive: doesn't have at home. HCPOA is son Magdaline Zollars. Full code.  Seat belt use discussed  Sunscreen use discussed. No changing moles on skin.  Non smoker  Alcohol - none  Dentist yearly Eye exam yearly  Bowel - chronic constipation manages with fleet enema 2-3d/wk. Occ abd pain, nausea, cramping. No h/o bowel obstruction.  Bladder - no incontinence.   Caffeine: rare   Lives alone, no pets. Grown son 29yo, grandson died suddenly at [employer] 02-27-09 (drowning) - lots of stress from this.   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 Chrystie   Constipation    severe requiring laxatives, failed miralax   Enlarged thyroid     h/o abnl labs but never treated   Family history of breast cancer in first degree relative    History  of colon polyps    hyperplastic polyp 02-28-2007, 02-28-2012 hemorrhoids, rec rpt 5 yrs   History of herpes genitalis    valtrex  prn   HLD (hyperlipidemia)    HTN (hypertension)    IBS (irritable bowel syndrome)    Reflux esophagitis    PPI   Sickle cell trait    Subclinical hyperthyroidism 12/22/2012   yearly visit with endo   Thyroid  nodule 06/2011   R nodule, FNA biopsy = benign follicular, stable US  (12/2011 and 12/2012)   Past Surgical History:  Procedure Laterality Date   COLONOSCOPY  07/2011   hemorrhoids, rpt due 5 yrs given h/o polyps   COLONOSCOPY WITH PROPOFOL  N/A 05/29/2016   TA, rpt 5 yrs Nestor Kung, MD)   COLONOSCOPY WITH PROPOFOL  N/A 07/04/2021   WNL, rpt 5 yrs Romero)   ESOPHAGOGASTRODUODENOSCOPY     TVS  11/2008   multiple uterine fibroids   Family History  Problem Relation Age of Onset   Heart disease Mother    Deep vein thrombosis Mother    Dementia Mother 32   Heart disease Father        pig valve   Coronary artery disease Father    Cancer Father        prostate   Cancer Maternal Grandmother        colon   Stroke Maternal Grandfather    Diabetes Sister    Mental illness Sister        schizophrenia   Cancer Sister  50       breast   Breast cancer Sister 54   Cancer Brother        appendix tumor, prostate   Diabetes Brother    Mental illness Brother        bipolar   Cancer Maternal Aunt        bone   Cancer Maternal Uncle        bladder   Coronary artery disease Paternal Grandfather    Cancer Cousin        ovarian, bone   Breast cancer Cousin 50   Cancer Cousin        breast   Breast cancer Other 66       neice   Social History   Occupational History   Occupation: Surveyor, [employer]  Tobacco Use   Smoking status: Never   Smokeless tobacco: Never  Vaping Use   Vaping status: Never Used  Substance and Sexual Activity   Alcohol use: Not Currently   Drug use: No   Sexual activity: Not Currently    Birth  control/protection: Abstinence   Tobacco Counseling Counseling given: Yes  SDOH Screenings   Food Insecurity: No Food Insecurity (02/10/2024)  Housing: Unknown (02/10/2024)  Transportation Needs: No Transportation Needs (02/10/2024)  Utilities: Not At Risk (02/10/2024)  Depression (PHQ2-9): Low Risk  (02/10/2024)  Physical Activity: Inactive (02/10/2024)  Social Connections: Moderately Integrated (02/10/2024)  Stress: No Stress Concern Present (02/10/2024)  Tobacco Use: Low Risk  (02/10/2024)  Health Literacy: Inadequate Health Literacy (02/10/2024)   See flowsheets for full screening details  Depression Screen PHQ 2 & 9 Depression Scale- Over the past 2 weeks, how often have you been bothered by any of the following problems? Little interest or pleasure in doing things: 0 Feeling down, depressed, or hopeless (PHQ Adolescent also includes...irritable): 0 PHQ-2 Total Score: 0 Trouble falling or staying asleep, or sleeping too much: 1 Feeling tired or having little energy: 1 Poor appetite or overeating (PHQ Adolescent also includes...weight loss): 1 Feeling bad about yourself - or that you are a failure or have let yourself or your family down: 0 Trouble concentrating on things, such as reading the newspaper or watching television (PHQ Adolescent also includes...like school work): 0 Moving or speaking so slowly that other people could have noticed. Or the opposite - being so fidgety or restless that you have been moving around a lot more than usual: 0 Thoughts that you would be better off dead, or of hurting yourself in some way: 0 PHQ-9 Total Score: 3 If you checked off any problems, how difficult have these problems made it for you to do your work, take care of things at home, or get along with other people?: Not difficult at all  Depression Treatment Depression Interventions/Treatment : Counseling      Goals Addressed             This Visit's Progress    Exercise 3x per  week (30 min per time)         Fall Screening Falls in the past year?: 0 Number of falls in past year: 0 Was there an injury with Fall?: 0 Fall Risk Category Calculator: 0 Patient Fall Risk Level: Low Fall Risk  Fall Risk Patient at Risk for Falls Due to: No Fall Risks Fall risk Follow up: Falls evaluation completed       Objective:    Today's Vitals   02/10/24 0943 02/10/24 0950 02/10/24  1030  BP: (!) 138/112 (!) 142/98 (!) 156/86  Pulse: 75    Temp: 98.2 F (36.8 C)    TempSrc: Oral    SpO2: 97%    Weight: 185 lb (83.9 kg)    Height: 5' 6.5 (1.689 m)     Body mass index is 29.41 kg/m.   Physical Exam Vitals and nursing note reviewed.  Constitutional:      Appearance: Normal appearance. She is not ill-appearing.  HENT:     Head: Normocephalic and atraumatic.     Right Ear: Tympanic membrane, ear canal and external ear normal. There is no impacted cerumen.     Left Ear: Tympanic membrane, ear canal and external ear normal. There is no impacted cerumen.     Mouth/Throat:     Mouth: Mucous membranes are moist.     Pharynx: Oropharynx is clear. No oropharyngeal exudate or posterior oropharyngeal erythema.  Eyes:     General:        Right eye: No discharge.        Left eye: No discharge.     Extraocular Movements: Extraocular movements intact.     Conjunctiva/sclera: Conjunctivae normal.     Pupils: Pupils are equal, round, and reactive to light.  Neck:     Thyroid : No thyroid  mass or thyromegaly.     Vascular: No carotid bruit.  Cardiovascular:     Rate and Rhythm: Normal rate and regular rhythm.     Pulses: Normal pulses.     Heart sounds: Normal heart sounds. No murmur heard. Pulmonary:     Effort: Pulmonary effort is normal. No respiratory distress.     Breath sounds: Normal breath sounds. No wheezing, rhonchi or rales.  Abdominal:     General: Bowel sounds are normal. There is no distension.     Palpations: Abdomen is soft. There is no mass.      Tenderness: There is no abdominal tenderness. There is no guarding or rebound.     Hernia: No hernia is present.  Musculoskeletal:     Cervical back: Normal range of motion and neck supple. No rigidity.     Right lower leg: No edema.     Left lower leg: No edema.  Lymphadenopathy:     Cervical: No cervical adenopathy.  Skin:    General: Skin is warm and dry.     Findings: No rash.  Neurological:     General: No focal deficit present.     Mental Status: She is alert. Mental status is at baseline.  Psychiatric:        Mood and Affect: Mood normal.        Behavior: Behavior normal.      Current Medications (verified) Outpatient Encounter Medications as of 02/10/2024  Medication Sig   aspirin EC 81 MG tablet Take 81 mg by mouth once a week. Swallow whole.   Multiple Vitamin (MULTIVITAMIN) tablet Take 1 tablet by mouth daily.   omeprazole  (PRILOSEC) 40 MG capsule Take 1 capsule (40 mg total) by mouth daily.   [DISCONTINUED] atenolol  (TENORMIN ) 50 MG tablet Take 1 tablet (50 mg total) by mouth 2 (two) times daily.   [DISCONTINUED] omeprazole  (PRILOSEC) 20 MG capsule Take 1 capsule (20 mg total) by mouth daily as needed (GERD).   [DISCONTINUED] Red Yeast Rice Extract (RED YEAST RICE PO) Take 1 tablet by mouth in the morning and at bedtime.   atenolol  (TENORMIN ) 50 MG tablet Take 1 tablet (50 mg total) by mouth 2 (two)  times daily.   linaclotide  (LINZESS ) 145 MCG CAPS capsule Take 1 capsule (145 mcg total) by mouth daily before breakfast.   Red Yeast Rice 600 MG CAPS Take 1 capsule (600 mg total) by mouth in the morning and at bedtime. With niacin 50mg  and CoqQ10 30mg    valACYclovir  (VALTREX ) 500 MG tablet TAKE 1 TABLET (500 MG TOTAL) BY MOUTH 2 (TWO) TIMES DAILY. FOR 3 DAYS (Patient not taking: Reported on 02/10/2024)   [DISCONTINUED] cephALEXin  (KEFLEX ) 500 MG capsule Take 2 capsules (1,000 mg total) by mouth 2 (two) times daily. (Patient not taking: Reported on 02/10/2024)    [DISCONTINUED] fluconazole  (DIFLUCAN ) 150 MG tablet Take 1 tab po today, repeat in 7 days if needed (Patient not taking: Reported on 02/10/2024)   [DISCONTINUED] MAGNESIUM PO Take 1 capsule by mouth daily. (Patient not taking: Reported on 02/10/2024)   [DISCONTINUED] psyllium (HYDROCIL/METAMUCIL) 95 % PACK Take 1 packet by mouth daily. (Patient not taking: Reported on 02/10/2024)   No facility-administered encounter medications on file as of 02/10/2024.   Hearing/Vision screen Hearing Screening   250Hz  500Hz  1000Hz  2000Hz  3000Hz  4000Hz  5000Hz  6000Hz  8000Hz   Right ear 20 20 20 20 25 20 20 20 20   Left ear 20 20 20 20  45 20 20 20 20    Vision Screening   Right eye Left eye Both eyes  Without correction 20/50 20/40 20/40   With correction     Comments: Patient uses readers but didn't have at visit    Immunizations and Health Maintenance Health Maintenance  Topic Date Due   DEXA SCAN  Never done   COVID-19 Vaccine (5 - 2025-26 season) 11/25/2023   Mammogram  03/11/2024   Medicare Annual Wellness (AWV)  02/09/2025   Cervical Cancer Screening (HPV/Pap Cotest)  02/03/2026   Colonoscopy  07/05/2026   DTaP/Tdap/Td (4 - Td or Tdap) 02/07/2033   Pneumococcal Vaccine: 50+ Years  Completed   Influenza Vaccine  Completed   Hepatitis C Screening  Completed   HIV Screening  Completed   Zoster Vaccines- Shingrix   Completed   Hepatitis B Vaccines 19-59 Average Risk  Aged Out   Meningococcal B Vaccine  Aged Out    EKG: NSR rate 60s, 1st degree AV block with PR 212 ms, normal axis, no hypertrophy or acute ST/T changes.     Assessment/Plan:  This is a routine wellness examination for [employer].  Patient Care Team: Rilla Baller, MD as PCP - General (Family Medicine)  I have personally reviewed and noted the following in the patient's chart:   Medical and social history Use of alcohol, tobacco or illicit drugs  Current medications and supplements including opioid prescriptions. Functional  ability and status Nutritional status Physical activity Advanced directives List of other physicians Hospitalizations, surgeries, and ER visits in previous 12 months Vitals Screenings to include cognitive, depression, and falls Referrals and appointments  Orders Placed This Encounter  Procedures   Pneumococcal conjugate vaccine 20-valent (Prevnar 20)   EKG 12-Lead   In addition, I have reviewed and discussed with patient certain preventive protocols, quality metrics, and best practice recommendations. A written personalized care plan for preventive services as well as general preventive health recommendations were provided to patient.  Page was seen today for medicare wellness.  Welcome to Medicare preventive visit Assessment & Plan: I have personally reviewed the Medicare Annual Wellness questionnaire and have noted 1. The patient's medical and social history 2. Their use of alcohol, tobacco or illicit drugs 3. Their current medications and supplements 4. The  patient's functional ability including ADL's, fall risks, home safety risks and hearing or visual impairment. Cognitive function has been assessed and addressed as indicated.  5. Diet and physical activity 6. Evidence for depression or mood disorders The patients weight, height, BMI have been recorded in the chart. I have made referrals, counseling and provided education to the patient based on review of the above and I have provided the pt with a written personalized care plan for preventive services. Provider list updated.. See scanned questionairre as needed for further documentation. Reviewed preventative protocols and updated unless pt declined.   Failed vision screen - already sees eye doctor q2 yrs - rec reschedule if not recently done.   Orders: -     EKG 12-Lead  Primary hypertension Assessment & Plan: Chronic, BP remaining elevated today despite atenolol  50mg  bid - and EKG with evidence of 1st degree AV  block.  Will start losartan 25mg  daily, return to office in 3 months for HTN f/u.  Recommend she monitor BP at home with new home BP cuff.    Elevated lipoprotein(a) Overview: Lp(a) 377 (01/2023) - Crestor  10mg  started  Coronary CT with calcium  score of zero (01/2023)  Crestor  stopped.   Assessment & Plan: Chronic, on RYR BID. Lpa 377 (01/2023) however cardiac CT with calcium  score of zero 01/2023 The 10-year ASCVD risk score (Arnett DK, et al., 2019) is: 13.4%   Values used to calculate the score:     Age: 26 years     Clincally relevant sex: Female     Is Non-Hispanic African American: Yes     Diabetic: No     Tobacco smoker: No     Systolic Blood Pressure: 140 mmHg     Is BP treated: Yes     HDL Cholesterol: 73.2 mg/dL     Total Cholesterol: 262 mg/dL    Subclinical hyperthyroidism Overview: Normal TSI  Assessment & Plan: Saw endo Shamleffer, last seen 03/2021. TFTs stable - will need continued yearly TFTs.    Encounter for Medicare annual wellness exam  Need for vaccination against Streptococcus pneumoniae -     Pneumococcal conjugate vaccine 20-valent  Advanced directives, counseling/discussion Assessment & Plan: Advanced directive: doesn't have at home. HCPOA is son Margaretmary Prisk. Full code.  Asked to work on this.   Gastroesophageal reflux disease with esophagitis without hemorrhage Assessment & Plan: Continue PRN omeprazole  40mg  - refilled today.    Other orders -     Atenolol ; Take 1 tablet (50 mg total) by mouth 2 (two) times daily.  Dispense: 180 tablet; Refill: 3 -     Omeprazole ; Take 1 capsule (40 mg total) by mouth daily.  Dispense: 90 capsule; Refill: 1 -     Red Yeast Rice; Take 1 capsule (600 mg total) by mouth in the morning and at bedtime. With niacin 50mg  and CoqQ10 30mg  -     linaCLOtide ; Take 1 capsule (145 mcg total) by mouth daily before breakfast.  Dispense: 30 capsule; Refill: 3      Anton Blas, MD   02/10/2024    Return in 3 months (on 05/12/2024) for follow up visit.

## 2024-02-10 NOTE — Assessment & Plan Note (Signed)
 Continue PRN omeprazole  40mg  - refilled today.

## 2024-02-10 NOTE — Assessment & Plan Note (Addendum)
 Saw endo Shamleffer, last seen 03/2021. TFTs stable - will need continued yearly TFTs.

## 2024-02-10 NOTE — Patient Instructions (Addendum)
 Prevnar-20 today EKG today  Hearing and vision today.  BP was too high - add losartan 25mg  daily for blood pressure control in addition to atenolol .  Schedule 3 month f/u visit for BP recheck.  Good to see you today  Kendra Jackson,  Thank you for taking the time for your Medicare Wellness Visit. I appreciate your continued commitment to your health goals. Please review the care plan we discussed, and feel free to reach out if I can assist you further.  Please note that Annual Wellness Visits do not include a physical exam. Some assessments may be limited, especially if the visit was conducted virtually. If needed, we may recommend an in-person follow-up with your provider.  Ongoing Care Seeing your primary care provider every 3 to 6 months helps us  monitor your health and provide consistent, personalized care.   Referrals If a referral was made during today's visit and you haven't received any updates within two weeks, please contact the referred provider directly to check on the status.  Recommended Screenings:  Health Maintenance  Topic Date Due   DEXA scan (bone density measurement)  Never done   COVID-19 Vaccine (5 - 2025-26 season) 11/25/2023   Breast Cancer Screening  03/11/2024   Medicare Annual Wellness Visit  02/09/2025   Pap with HPV screening  02/03/2026   Colon Cancer Screening  07/05/2026   DTaP/Tdap/Td vaccine (4 - Td or Tdap) 02/07/2033   Pneumococcal Vaccine for age over 69  Completed   Flu Shot  Completed   Hepatitis C Screening  Completed   HIV Screening  Completed   Zoster (Shingles) Vaccine  Completed   Hepatitis B Vaccine  Aged Out   Meningitis B Vaccine  Aged Out       07/04/2021    7:37 AM  Advanced Directives  Does Patient Have a Medical Advance Directive? No  Would patient like information on creating a medical advance directive? No - Patient declined    Vision: Annual vision screenings are recommended for early detection of glaucoma, cataracts,  and diabetic retinopathy. These exams can also reveal signs of chronic conditions such as diabetes and high blood pressure.  Dental: Annual dental screenings help detect early signs of oral cancer, gum disease, and other conditions linked to overall health, including heart disease and diabetes.  Please see the attached documents for additional preventive care recommendations.

## 2024-02-10 NOTE — Assessment & Plan Note (Addendum)
 I have personally reviewed the Medicare Annual Wellness questionnaire and have noted 1. The patient's medical and social history 2. Their use of alcohol, tobacco or illicit drugs 3. Their current medications and supplements 4. The patient's functional ability including ADL's, fall risks, home safety risks and hearing or visual impairment. Cognitive function has been assessed and addressed as indicated.  5. Diet and physical activity 6. Evidence for depression or mood disorders The patients weight, height, BMI have been recorded in the chart. I have made referrals, counseling and provided education to the patient based on review of the above and I have provided the pt with a written personalized care plan for preventive services. Provider list updated.. See scanned questionairre as needed for further documentation. Reviewed preventative protocols and updated unless pt declined.   Failed vision screen - already sees eye doctor q2 yrs - rec reschedule if not recently done.

## 2024-02-10 NOTE — Assessment & Plan Note (Addendum)
 Advanced directive: doesn't have at home. HCPOA is son Justyne Roell. Full code.  Asked to work on this.

## 2024-02-10 NOTE — Assessment & Plan Note (Addendum)
 Chronic, BP remaining elevated today despite atenolol  50mg  bid - and EKG with evidence of 1st degree AV block.  Will start losartan 25mg  daily, return to office in 3 months for HTN f/u.  Recommend she monitor BP at home with new home BP cuff.

## 2024-02-10 NOTE — Assessment & Plan Note (Addendum)
 Chronic, on RYR BID. Lpa 377 (01/2023) however cardiac CT with calcium  score of zero 01/2023 The 10-year ASCVD risk score (Arnett DK, et al., 2019) is: 13.4%   Values used to calculate the score:     Age: 62 years     Clincally relevant sex: Female     Is Non-Hispanic African American: Yes     Diabetic: No     Tobacco smoker: No     Systolic Blood Pressure: 140 mmHg     Is BP treated: Yes     HDL Cholesterol: 73.2 mg/dL     Total Cholesterol: 262 mg/dL

## 2024-02-12 ENCOUNTER — Telehealth: Payer: Self-pay | Admitting: Family Medicine

## 2024-02-12 NOTE — Telephone Encounter (Signed)
 Copied from CRM [id number]. Topic: Clinical - Medication Refill >> Feb 12, 2024  9:26 AM Mercedes MATSU wrote: Medication: atenolol  (TENORMIN ) 25 MG tablet The wrong dosage was sent based off what the patient states.  Has the patient contacted their pharmacy? Yes (Agent: If no, request that the patient contact the pharmacy for the refill. If patient does not wish to contact the pharmacy document the reason why and proceed with request.) (Agent: If yes, when and what did the pharmacy advise?)  This is the patient's preferred pharmacy:  [facility] [phone] [place] - [address] [place] [address] Phone: [phone] Fax: [phone]  Is this the correct pharmacy for this prescription? Yes If no, delete pharmacy and type the correct one.   Has the prescription been filled recently? Yes  Is the patient out of the medication? Yes  Has the patient been seen for an appointment in the last year OR does the patient have an upcoming appointment? Yes  Can we respond through MyChart? Yes  Agent: Please be advised that Rx refills may take up to 3 business days. We ask that you follow-up with your pharmacy.

## 2024-02-12 NOTE — Telephone Encounter (Signed)
 Medication: atenolol  (TENORMIN ) 25 MG tablet The wrong dosage was sent based off what the patient states.

## 2024-02-13 ENCOUNTER — Other Ambulatory Visit: Payer: Self-pay | Admitting: Family Medicine

## 2024-02-13 DIAGNOSIS — Z1231 Encounter for screening mammogram for malignant neoplasm of breast: Secondary | ICD-10-CM

## 2024-02-14 MED ORDER — LOSARTAN POTASSIUM 25 MG PO TABS: 25.0000 mg | ORAL_TABLET | Freq: Every day | ORAL | 6 refills | Status: AC

## 2024-02-14 NOTE — Telephone Encounter (Signed)
 Called patient thought that she was getting changed to new bp medication. They didn't have any at pharmacy. She also wanted to know if she needs to stay on the Atenolol ?

## 2024-02-14 NOTE — Addendum Note (Signed)
 Addended by: RILLA BALLER on: 02/14/2024 02:14 PM   Modules accepted: Orders

## 2024-02-14 NOTE — Telephone Encounter (Signed)
 Yes I did want her to start losartan  25mg  once daily - this will be in addition to atenolol  50mg  twice daily. I have sent losartan  to her pharmacy.

## 2024-02-17 NOTE — Telephone Encounter (Signed)
 Called patient.Reviewed all information. Pt states she picked up her medication and has no further questions.

## 2024-03-24 ENCOUNTER — Ambulatory Visit
Admission: RE | Admit: 2024-03-24 | Discharge: 2024-03-24 | Disposition: A | Discharge status: Home / Self Care | Source: Ambulatory Visit | Attending: Family Medicine | Admitting: Family Medicine

## 2024-03-24 DIAGNOSIS — Z1231 Encounter for screening mammogram for malignant neoplasm of breast: Secondary | ICD-10-CM | POA: Diagnosis present

## 2024-03-28 ENCOUNTER — Ambulatory Visit: Payer: Self-pay | Admitting: Family Medicine

## 2024-04-29 ENCOUNTER — Telehealth: Payer: Self-pay

## 2024-04-29 MED ORDER — BENZONATATE 100 MG PO CAPS: 100.0000 mg | ORAL_CAPSULE | Freq: Three times a day (TID) | ORAL | 0 refills | Status: AC | PRN

## 2024-04-29 NOTE — Addendum Note (Signed)
 Addended by: RILLA BALLER on: 04/29/2024 05:29 PM   Modules accepted: Orders

## 2024-04-29 NOTE — Telephone Encounter (Addendum)
 Replied via mychart

## 2024-04-29 NOTE — Telephone Encounter (Signed)
 Copied from CRM #[id number]. Topic: Clinical - Medication Question >> Apr 29, 2024 11:52 AM China J wrote: Reason for CRM: The patient has been struggling with an ongoing cough along with mucus build up for about a week now. She does not have any other symptoms and does not think an appointment would be necessary. She was wondering if Dr. Rilla could send in a cough syrup she took in the past that fixed the same symptoms quickly. The medication is called chlorpheniramine -HYDROcodone  (TUSSIONEX PENNKINETIC  ER) 10-8 MG/5ML SURE.  Patient's preferred pharmacy is:  [facility] [address], [place] - [address] [place] [address] Phone: [phone] Fax: [phone] Hours: Not open 24 hours  Patient would like if a MyChart message could be sent over for an update as she is resting and will have her phone off.

## 2024-05-12 ENCOUNTER — Ambulatory Visit: Admitting: Family Medicine
